# Patient Record
Sex: Female | Born: 1945 | Race: Black or African American | Hispanic: No | Smoking: Never smoker
Health system: Southern US, Community
[De-identification: ages and names within clinical notes are randomized; demographics above are authoritative.]

## PROBLEM LIST (undated history)

## (undated) ENCOUNTER — Inpatient Hospital Stay: Admission: EM | Payer: Self-pay | Source: Home / Self Care

## (undated) DIAGNOSIS — T4145XA Adverse effect of unspecified anesthetic, initial encounter: Secondary | ICD-10-CM

## (undated) DIAGNOSIS — F419 Anxiety disorder, unspecified: Secondary | ICD-10-CM

## (undated) DIAGNOSIS — I639 Cerebral infarction, unspecified: Secondary | ICD-10-CM

## (undated) DIAGNOSIS — Z21 Asymptomatic human immunodeficiency virus [HIV] infection status: Secondary | ICD-10-CM

## (undated) DIAGNOSIS — B20 Human immunodeficiency virus [HIV] disease: Secondary | ICD-10-CM

## (undated) DIAGNOSIS — B59 Pneumocystosis: Secondary | ICD-10-CM

## (undated) DIAGNOSIS — Z9221 Personal history of antineoplastic chemotherapy: Secondary | ICD-10-CM

## (undated) DIAGNOSIS — D332 Benign neoplasm of brain, unspecified: Secondary | ICD-10-CM

## (undated) DIAGNOSIS — M87052 Idiopathic aseptic necrosis of left femur: Secondary | ICD-10-CM

## (undated) DIAGNOSIS — T8859XA Other complications of anesthesia, initial encounter: Secondary | ICD-10-CM

## (undated) DIAGNOSIS — C187 Malignant neoplasm of sigmoid colon: Secondary | ICD-10-CM

## (undated) DIAGNOSIS — I10 Essential (primary) hypertension: Secondary | ICD-10-CM

## (undated) DIAGNOSIS — M87051 Idiopathic aseptic necrosis of right femur: Secondary | ICD-10-CM

## (undated) DIAGNOSIS — C50911 Malignant neoplasm of unspecified site of right female breast: Secondary | ICD-10-CM

## (undated) HISTORY — PX: ABDOMINAL HYSTERECTOMY: SHX81

## (undated) HISTORY — PX: COLON SURGERY: SHX602

## (undated) HISTORY — DX: Cerebral infarction, unspecified: I63.9

## (undated) HISTORY — DX: Benign neoplasm of brain, unspecified: D33.2

## (undated) HISTORY — DX: Malignant neoplasm of sigmoid colon: C18.7

## (undated) HISTORY — DX: Pneumocystosis: B59

## (undated) HISTORY — DX: Personal history of antineoplastic chemotherapy: Z92.21

## (undated) HISTORY — PX: TONSILLECTOMY: SUR1361

---

## 1992-10-16 DIAGNOSIS — B59 Pneumocystosis: Secondary | ICD-10-CM

## 1992-10-16 HISTORY — DX: Pneumocystosis: B59

## 2009-10-16 DIAGNOSIS — C187 Malignant neoplasm of sigmoid colon: Secondary | ICD-10-CM

## 2009-10-16 HISTORY — DX: Malignant neoplasm of sigmoid colon: C18.7

## 2010-10-16 DIAGNOSIS — Z9221 Personal history of antineoplastic chemotherapy: Secondary | ICD-10-CM

## 2010-10-16 HISTORY — DX: Personal history of antineoplastic chemotherapy: Z92.21

## 2010-10-16 HISTORY — PX: TOTAL ABDOMINAL HYSTERECTOMY W/ BILATERAL SALPINGOOPHORECTOMY: SHX83

## 2014-01-07 ENCOUNTER — Telehealth: Payer: Self-pay

## 2014-01-07 DIAGNOSIS — B20 Human immunodeficiency virus [HIV] disease: Secondary | ICD-10-CM | POA: Insufficient documentation

## 2014-01-07 NOTE — Telephone Encounter (Signed)
Patient contacted regarding new intake appointment. Date and time given. Information given regarding documents needed to qualify for financial eligibility.  Kelly Chavez K Asha Grumbine, RN  

## 2014-01-22 ENCOUNTER — Ambulatory Visit (INDEPENDENT_AMBULATORY_CARE_PROVIDER_SITE_OTHER): Payer: No Typology Code available for payment source

## 2014-01-22 DIAGNOSIS — B20 Human immunodeficiency virus [HIV] disease: Secondary | ICD-10-CM

## 2014-01-22 DIAGNOSIS — N83201 Unspecified ovarian cyst, right side: Secondary | ICD-10-CM

## 2014-01-22 DIAGNOSIS — M1990 Unspecified osteoarthritis, unspecified site: Secondary | ICD-10-CM

## 2014-01-22 DIAGNOSIS — A6000 Herpesviral infection of urogenital system, unspecified: Secondary | ICD-10-CM

## 2014-01-22 DIAGNOSIS — Z87898 Personal history of other specified conditions: Secondary | ICD-10-CM

## 2014-01-22 DIAGNOSIS — Z21 Asymptomatic human immunodeficiency virus [HIV] infection status: Secondary | ICD-10-CM

## 2014-01-22 DIAGNOSIS — Z79899 Other long term (current) drug therapy: Secondary | ICD-10-CM

## 2014-01-22 DIAGNOSIS — N83202 Unspecified ovarian cyst, left side: Secondary | ICD-10-CM

## 2014-01-22 DIAGNOSIS — Z0000 Encounter for general adult medical examination without abnormal findings: Secondary | ICD-10-CM

## 2014-01-22 DIAGNOSIS — Z113 Encounter for screening for infections with a predominantly sexual mode of transmission: Secondary | ICD-10-CM

## 2014-01-22 DIAGNOSIS — E559 Vitamin D deficiency, unspecified: Secondary | ICD-10-CM

## 2014-01-22 DIAGNOSIS — I10 Essential (primary) hypertension: Secondary | ICD-10-CM

## 2014-01-22 DIAGNOSIS — M87052 Idiopathic aseptic necrosis of left femur: Secondary | ICD-10-CM

## 2014-01-22 DIAGNOSIS — M87051 Idiopathic aseptic necrosis of right femur: Secondary | ICD-10-CM

## 2014-01-22 DIAGNOSIS — Z23 Encounter for immunization: Secondary | ICD-10-CM

## 2014-01-22 LAB — CBC WITH DIFFERENTIAL/PLATELET
Basophils Absolute: 0 10*3/uL (ref 0.0–0.1)
Basophils Relative: 0 % (ref 0–1)
Eosinophils Absolute: 0.1 10*3/uL (ref 0.0–0.7)
Eosinophils Relative: 1 % (ref 0–5)
HCT: 37 % (ref 36.0–46.0)
Hemoglobin: 12.6 g/dL (ref 12.0–15.0)
LYMPHS ABS: 2.6 10*3/uL (ref 0.7–4.0)
Lymphocytes Relative: 42 % (ref 12–46)
MCH: 29.7 pg (ref 26.0–34.0)
MCHC: 34.1 g/dL (ref 30.0–36.0)
MCV: 87.3 fL (ref 78.0–100.0)
Monocytes Absolute: 0.4 10*3/uL (ref 0.1–1.0)
Monocytes Relative: 6 % (ref 3–12)
NEUTROS ABS: 3.1 10*3/uL (ref 1.7–7.7)
NEUTROS PCT: 51 % (ref 43–77)
Platelets: 271 10*3/uL (ref 150–400)
RBC: 4.24 MIL/uL (ref 3.87–5.11)
RDW: 13.6 % (ref 11.5–15.5)
WBC: 6.1 10*3/uL (ref 4.0–10.5)

## 2014-01-22 LAB — COMPLETE METABOLIC PANEL WITH GFR
ALK PHOS: 143 U/L — AB (ref 39–117)
ALT: 24 U/L (ref 0–35)
AST: 27 U/L (ref 0–37)
Albumin: 3.9 g/dL (ref 3.5–5.2)
BUN: 12 mg/dL (ref 6–23)
CO2: 25 meq/L (ref 19–32)
Calcium: 9.3 mg/dL (ref 8.4–10.5)
Chloride: 103 mEq/L (ref 96–112)
Creat: 0.82 mg/dL (ref 0.50–1.10)
GFR, Est African American: 86 mL/min
GFR, Est Non African American: 74 mL/min
Glucose, Bld: 72 mg/dL (ref 70–99)
Potassium: 4.1 mEq/L (ref 3.5–5.3)
SODIUM: 140 meq/L (ref 135–145)
TOTAL PROTEIN: 7.7 g/dL (ref 6.0–8.3)
Total Bilirubin: 0.3 mg/dL (ref 0.2–1.2)

## 2014-01-22 LAB — LIPID PANEL
Cholesterol: 226 mg/dL — ABNORMAL HIGH (ref 0–200)
HDL: 46 mg/dL (ref >39)
LDL Cholesterol: 159 mg/dL — ABNORMAL HIGH (ref 0–99)
Total CHOL/HDL Ratio: 4.9 Ratio
Triglycerides: 106 mg/dL (ref <150)
VLDL: 21 mg/dL (ref 0–40)

## 2014-01-22 LAB — RPR

## 2014-01-23 LAB — URINALYSIS
Bilirubin Urine: NEGATIVE
Glucose, UA: NEGATIVE mg/dL
HGB URINE DIPSTICK: NEGATIVE
Ketones, ur: NEGATIVE mg/dL
Leukocytes, UA: NEGATIVE
NITRITE: NEGATIVE
Protein, ur: NEGATIVE mg/dL
Specific Gravity, Urine: 1.005 (ref 1.005–1.030)
UROBILINOGEN UA: 0.2 mg/dL (ref 0.0–1.0)
pH: 7 (ref 5.0–8.0)

## 2014-01-23 LAB — HEPATITIS C ANTIBODY: HCV Ab: NEGATIVE

## 2014-01-23 LAB — HEPATITIS B SURFACE ANTIBODY,QUALITATIVE: Hep B S Ab: NEGATIVE

## 2014-01-23 LAB — T-HELPER CELL (CD4) - (RCID CLINIC ONLY)
CD4 T CELL HELPER: 24 % — AB (ref 33–55)
CD4 T Cell Abs: 640 /uL (ref 400–2700)

## 2014-01-23 LAB — URINE CYTOLOGY ANCILLARY ONLY
Chlamydia: NEGATIVE
Neisseria Gonorrhea: NEGATIVE

## 2014-01-23 LAB — HEPATITIS A ANTIBODY, TOTAL: HEP A TOTAL AB: NONREACTIVE

## 2014-01-23 LAB — HEPATITIS B CORE ANTIBODY, TOTAL: Hep B Core Total Ab: NONREACTIVE

## 2014-01-23 LAB — HEPATITIS B SURFACE ANTIGEN: Hepatitis B Surface Ag: NEGATIVE

## 2014-01-24 LAB — HIV-1 RNA ULTRAQUANT REFLEX TO GENTYP+

## 2014-01-24 LAB — TB SKIN TEST
Induration: 0 mm
TB Skin Test: NEGATIVE

## 2014-01-26 DIAGNOSIS — N83201 Unspecified ovarian cyst, right side: Secondary | ICD-10-CM | POA: Insufficient documentation

## 2014-01-26 DIAGNOSIS — N83202 Unspecified ovarian cyst, left side: Secondary | ICD-10-CM

## 2014-01-26 DIAGNOSIS — E559 Vitamin D deficiency, unspecified: Secondary | ICD-10-CM | POA: Insufficient documentation

## 2014-01-26 DIAGNOSIS — I10 Essential (primary) hypertension: Secondary | ICD-10-CM | POA: Insufficient documentation

## 2014-01-26 DIAGNOSIS — M1990 Unspecified osteoarthritis, unspecified site: Secondary | ICD-10-CM | POA: Insufficient documentation

## 2014-01-28 DIAGNOSIS — M87051 Idiopathic aseptic necrosis of right femur: Secondary | ICD-10-CM | POA: Insufficient documentation

## 2014-01-28 DIAGNOSIS — A6000 Herpesviral infection of urogenital system, unspecified: Secondary | ICD-10-CM | POA: Insufficient documentation

## 2014-01-28 DIAGNOSIS — Z87898 Personal history of other specified conditions: Secondary | ICD-10-CM | POA: Insufficient documentation

## 2014-01-28 DIAGNOSIS — M87052 Idiopathic aseptic necrosis of left femur: Secondary | ICD-10-CM

## 2014-01-28 NOTE — Progress Notes (Signed)
Patient recently moved from [place] to [place] to be closer to her son.  She has been diagnosed with HIV since 1994 and thinks she was infected by her husband of 13 years  whom she suspected may have been bisexual. He is now deceased.  She is a retired Education officer, museum. Patient lives alone and has complaints of feeling someone is coming in and out of her home.  Her son thinks she is "tripping".  She admits to feeling "paranoid' most of the time and wonders if it is associated with her current HIV regimen. Throughout the intake she asked  several times if I smelled something in the room  or if I smelled her body odor.  I was not able to smell anything.  Her medical records document previous complaints of paranoia and depression.   She  discussed having some issues regarding the shame of having HIV and the stigma associated with it. I have suggested she  speak with our counselor for assistance and she has accepted. She will return on 01-29-14 to see Grayland Ormond with [facility] the [place]. I also gave her information regarding our female support group.  Patient reports 100% compliance with HIV regimen.  She has not taken medications for hypertension since December 2014 and has no reason for non compliance.  She uses a walker to ambulate due to bilateral hip avascular necrosis. Patient will need a primary care referral due to complicated  medical history.  Medical records received.  Vaccines update.  Laverle Patter, RN

## 2014-01-29 ENCOUNTER — Ambulatory Visit: Payer: No Typology Code available for payment source

## 2014-01-29 DIAGNOSIS — F22 Delusional disorders: Secondary | ICD-10-CM

## 2014-01-29 NOTE — Progress Notes (Unsigned)
I met with Kelly Chavez today for the first time and she presents as a soft spoken 68 year old African American female with no apparent prior psychiatric history.  She walks slowly with the use of a wheeled walker.  She took 10-12 minutes or longer looking over the basic consent for treatment form and release of information form I have everyone sign at the beginning of treatment before signing it.  She asked if I smelled mildew and wondered if there was any mildew in this building, which I answered 'no'.  Then she coughed, and said "and now I'm coughing".    She answered questions fairly well, but when asked about her work history she simply said she had worked for "[employer]".  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  She said she moved here in December, 2014 to be closer to her son.  She rated her current mood at 5 on a 10 pt scale, but said it is usually a 7.  She reports good sleep - 8 & 1/2 hours and okay appetite.  She talked about her new house, which is apparently new Architect, and said she smells an alcohol smell or the mildew smell of wet, rotten wood.  She denied smelling it anywhere but in and around her house, which contradicted her prior questioning about it in this building.  Denies any suicidal thoughts.  She talked about water being therapeutic and how she used to go look out at the [facility].  She also used to enjoy classes at a senior center, so I gave her contact information for the [facility] on [address] here in [place].  She mentioned that she had received a phonecall years ago saying she would be "terrorized" and she has been on guard ever since.  She said her mother is 70 and now lives in a nursing home in [place].  She said she feels some remorse about putting her there, because she developed dementia once she was removed from her normal environment.  I pointed out that she too had left the environment she was comfortable and she agreed.  She is trying to get  connected with a church and said she would call the [facility].  I scheduled her for my next available, which is March 16, 2014, but told her I would look for a cancellation before then.  She clearly has some paranoid delusions, but it is unclear at this point the extent of this, let alone the origin.  A neurological exam may be useful at this time.  She clearly has functioned at a higher level most of her life.

## 2014-01-30 LAB — HLA B*5701: HLA-B*5701 w/rflx HLA-B High: NEGATIVE

## 2014-02-19 ENCOUNTER — Encounter: Payer: Self-pay | Admitting: Internal Medicine

## 2014-02-19 ENCOUNTER — Ambulatory Visit (INDEPENDENT_AMBULATORY_CARE_PROVIDER_SITE_OTHER): Payer: 59 | Admitting: Internal Medicine

## 2014-02-19 ENCOUNTER — Other Ambulatory Visit: Payer: Self-pay | Admitting: *Deleted

## 2014-02-19 VITALS — BP 195/105 | HR 94 | Temp 97.6°F | Wt 176.0 lb

## 2014-02-19 DIAGNOSIS — B20 Human immunodeficiency virus [HIV] disease: Secondary | ICD-10-CM

## 2014-02-19 DIAGNOSIS — N63 Unspecified lump in unspecified breast: Secondary | ICD-10-CM

## 2014-02-19 MED ORDER — RITONAVIR 100 MG PO CAPS: 100.0000 mg | ORAL_CAPSULE | Freq: Two times a day (BID) | ORAL | Status: DC

## 2014-02-19 MED ORDER — DARUNAVIR ETHANOLATE 600 MG PO TABS: 600.0000 mg | ORAL_TABLET | Freq: Two times a day (BID) | ORAL | Status: DC

## 2014-02-19 MED ORDER — EMTRICITABINE-TENOFOVIR DF 200-300 MG PO TABS: 1.0000 | ORAL_TABLET | Freq: Every day | ORAL | Status: DC

## 2014-02-19 NOTE — Progress Notes (Signed)
Subjective:    Patient ID: Kelly Chavez, female    DOB: 1946-04-11, 68 y.o.   MRN: [id number]  HPI 68yo F with HIV c/b pcp pneumonia at dx  in 1994, hx of colon ca dx 2011 s/p sigmoid colectomy & adjuvent chemo. HTN, Cd 4 count of 640/VL<20, on truvada/DRVr. She relocated in the last 3 months to [place] to be with son. Previously lived in [place]. She contracted hiv from her deceased husband who died of aids in 64. Unclear if she was previously tested prior to her dx.  ROS: has had hallucinations for several years, no harm to self or others. Not on meds for it. Left breast tenderness. Up for mammo soon. Current Outpatient Prescriptions on File Prior to Visit  Medication Sig Dispense Refill  . atenolol (TENORMIN) 50 MG tablet Take 50 mg by mouth daily.      . darunavir (PREZISTA) 600 MG tablet Take 600 mg by mouth daily with breakfast.      . emtricitabine-tenofovir (TRUVADA) 200-300 MG per tablet Take 1 tablet by mouth daily.      . ritonavir (NORVIR) 100 MG capsule Take 100 mg by mouth 2 (two) times daily with a meal.       No current facility-administered medications on file prior to visit.   Active Ambulatory Problems    Diagnosis Date Noted  . HIV disease 01/07/2014  . Ovarian cyst, bilateral 01/26/2014  . Essential hypertension, benign 01/26/2014  . Unspecified vitamin D deficiency 01/26/2014  . DJD (degenerative joint disease) 01/26/2014  . History of pituitary tumor 01/28/2014  . Avascular necrosis of bones of both hips 01/28/2014  . Genital herpes 01/28/2014   Resolved Ambulatory Problems    Diagnosis Date Noted  . No Resolved Ambulatory Problems   Past Medical History  Diagnosis Date  . PCP (pneumocystis carinii pneumonia) 1994  . Cancer of sigmoid colon 2011  . Status post chemotherapy 2012  . Brain tumor (benign)    History  Substance Use Topics  . Smoking status: Never Smoker   . Smokeless tobacco: Never Used  . Alcohol Use: No    family history includes  Cancer in her father; Dementia in her mother; Diabetes in her brother, father, and sister; Hyperlipidemia in her father and mother.   Review of Systems 10 point ros is negative except what is mentioned in hpi    Objective:   Physical Exam BP 195/105  Pulse 94  Temp(Src) 97.6 F (36.4 C) (Oral)  Wt 176 lb (79.833 kg) Physical Exam  Constitutional:  oriented to person, place, and time. appears well-developed and well-nourished. No distress.  HENT:  Mouth/Throat: Oropharynx is clear and moist. No oropharyngeal exudate.  Cardiovascular: Normal rate, regular rhythm and normal heart sounds. Exam reveals no gallop and no friction rub.  No murmur heard.  Pulmonary/Chest: Effort normal and breath sounds normal. No respiratory distress.  has no wheezes.  Abdominal: Soft. Bowel sounds are normal.  exhibits no distension. There is no tenderness.  Lymphadenopathy: no cervical adenopathy.  Neurological: alert and oriented to person, place, and time.  Skin: Skin is warm and dry. No rash noted. No erythema.  Psychiatric: a normal mood and affect.  behavior is normal.         Assessment & Plan:  hiv = will refill meds give co pay cards  htn = she menitioned she has not taken any bp meds thus far, will recheck at next visit and treat more accordingly. She is asymptomatic. On repeat  bp check, decreased to Sbp 169/95  Health maintenance = will give hep B #1. Also refer to pcp. mammo  Visual hallucinations = we will reassess at next appointment for referral to psychiatry. Based upon her history it appears that they have been long standing. She has an element of insight to some of the images that she sees but still abit unusual and may benefit from treatment

## 2014-02-26 ENCOUNTER — Telehealth: Payer: Self-pay | Admitting: Licensed Clinical Social Worker

## 2014-02-26 NOTE — Telephone Encounter (Signed)
Patient needs extraction and dental treatrment at Dr. Autumn Patty office they need a note stating that it is ok. Patient's viral load is undetectable and cd4 is 640. Please advise

## 2014-03-02 NOTE — Telephone Encounter (Signed)
Please let Dr. Evette Doffing knows that her HIV is well controlled and there is no contraindication to have any dental work done.

## 2014-03-16 ENCOUNTER — Ambulatory Visit: Payer: No Typology Code available for payment source

## 2014-03-16 DIAGNOSIS — F4320 Adjustment disorder, unspecified: Secondary | ICD-10-CM

## 2014-03-16 NOTE — Progress Notes (Signed)
Kelly Chavez was pleasant again today and participated in completing a treatment plan, which included a goal to reduce "incidents" where she thinks odd things are happening.  She said she is here to determine reality from fantasy and that her son thinks these things are "in her head".  She said she thinks people are listening in on her phone conversations.  She thinks this may be one of her "haters" - possibly someone she had an affair with in the past, who may be HIV+ now and is upset with her.  She also said her TV acts up sometimes, but wonders if she just doesn't know how to use the remote.  I mentioned neurological concerns, but she didn't say anything about it when I said this.  Plan to meet in 6 weeks due to scheduling, but I told her to call if she needs to get in sooner.

## 2014-03-18 ENCOUNTER — Other Ambulatory Visit: Payer: Self-pay | Admitting: Internal Medicine

## 2014-03-18 DIAGNOSIS — N644 Mastodynia: Secondary | ICD-10-CM

## 2014-03-26 ENCOUNTER — Encounter: Payer: Self-pay | Admitting: Internal Medicine

## 2014-03-26 ENCOUNTER — Ambulatory Visit (INDEPENDENT_AMBULATORY_CARE_PROVIDER_SITE_OTHER): Payer: No Typology Code available for payment source | Admitting: Internal Medicine

## 2014-03-26 VITALS — BP 208/103 | HR 75 | Temp 97.4°F | Wt 184.0 lb

## 2014-03-26 DIAGNOSIS — I10 Essential (primary) hypertension: Secondary | ICD-10-CM

## 2014-03-26 MED ORDER — AMLODIPINE BESYLATE 10 MG PO TABS: 10.0000 mg | ORAL_TABLET | Freq: Every day | ORAL | Status: DC

## 2014-03-26 NOTE — Progress Notes (Signed)
   Subjective:    Patient ID: Kelly Chavez, female    DOB: 09/27/1946, 68 y.o.   MRN: [id number]  HPI 68yo F, with HIV disease, CD 4 count of 640/VL<20 (april) on truvada/DRVr. Relocated from [place]. Last seen a week ago.She is getting settled into her new apartment. She states that occ still feels people are talking about her. She denies any visual hallucinations. She has noted breast lumps. Continues to take hiv medications. Has not had her blood pressure medicines this morning. Denies headache. She is distraught since she found out this morning that a friend had past away.  Current Outpatient Prescriptions on File Prior to Visit  Medication Sig Dispense Refill  . atenolol (TENORMIN) 50 MG tablet Take 50 mg by mouth daily.      . darunavir (PREZISTA) 600 MG tablet Take 1 tablet (600 mg total) by mouth 2 (two) times daily with a meal.  60 tablet  6  . emtricitabine-tenofovir (TRUVADA) 200-300 MG per tablet Take 1 tablet by mouth daily.  30 tablet  6  . ritonavir (NORVIR) 100 MG capsule Take 1 capsule (100 mg total) by mouth 2 (two) times daily with a meal.  60 capsule  6   No current facility-administered medications on file prior to visit.   Active Ambulatory Problems    Diagnosis Date Noted  . HIV disease 01/07/2014  . Ovarian cyst, bilateral 01/26/2014  . Essential hypertension, benign 01/26/2014  . Unspecified vitamin D deficiency 01/26/2014  . DJD (degenerative joint disease) 01/26/2014  . History of pituitary tumor 01/28/2014  . Avascular necrosis of bones of both hips 01/28/2014  . Genital herpes 01/28/2014  . Breast cancer of upper-inner quadrant of right female breast 04/15/2014   Resolved Ambulatory Problems    Diagnosis Date Noted  . No Resolved Ambulatory Problems   Past Medical History  Diagnosis Date  . PCP (pneumocystis carinii pneumonia) 1994  . Cancer of sigmoid colon 2011  . Status post chemotherapy 2012  . Brain tumor (benign)        Review of Systems 10  point ros is negative other than concern of mortality in setting of recent death of friend    Objective:   Physical Exam  BP 208/103  Pulse 75  Temp(Src) 97.4 F (36.3 C) (Oral)  Wt 184 lb (83.462 kg) Physical Exam  Constitutional:  oriented to person, place, and time. appears well-developed and well-nourished. No distress.  HENT:  Mouth/Throat: Oropharynx is clear and moist. No oropharyngeal exudate.  Cardiovascular: Normal rate, regular rhythm and normal heart sounds. Exam reveals no gallop and no friction rub.  No murmur heard.  Pulmonary/Chest: Effort normal and breath sounds normal. No respiratory distress.  has no wheezes.  Lymphadenopathy: no cervical adenopathy.  Neurological: alert and oriented to person, place, and time.  Skin: Skin is warm and dry. No rash noted. No erythema.  Psychiatric: a normal mood and affect.  behavior is normal.        Assessment & Plan:  Hiv = well controlled, continue on current regimen  htn = poorly controlled, will add amlodipine 10mg , start with 1/2 tab then increase to full tab.  Health maintenance = due to having breast lump, concern for malignancy, will get her to have mammogram  Visual/auditory hallucinaitons = does not appear to have medications or seeing psychiatry. Will refer at next appointment. Currently stable and not causing harm to self or others

## 2014-04-02 ENCOUNTER — Other Ambulatory Visit: Payer: Self-pay | Admitting: Internal Medicine

## 2014-04-02 ENCOUNTER — Encounter (INDEPENDENT_AMBULATORY_CARE_PROVIDER_SITE_OTHER): Payer: Self-pay

## 2014-04-02 ENCOUNTER — Ambulatory Visit
Admission: RE | Admit: 2014-04-02 | Discharge: 2014-04-02 | Disposition: A | Discharge status: Home / Self Care | Payer: No Typology Code available for payment source | Source: Ambulatory Visit | Attending: Internal Medicine | Admitting: Internal Medicine

## 2014-04-02 DIAGNOSIS — N644 Mastodynia: Secondary | ICD-10-CM

## 2014-04-02 DIAGNOSIS — R2231 Localized swelling, mass and lump, right upper limb: Secondary | ICD-10-CM

## 2014-04-02 DIAGNOSIS — N6310 Unspecified lump in the right breast, unspecified quadrant: Secondary | ICD-10-CM

## 2014-04-09 ENCOUNTER — Ambulatory Visit
Admission: RE | Admit: 2014-04-09 | Discharge: 2014-04-09 | Disposition: A | Discharge status: Home / Self Care | Payer: No Typology Code available for payment source | Source: Ambulatory Visit | Attending: Internal Medicine | Admitting: Internal Medicine

## 2014-04-09 ENCOUNTER — Other Ambulatory Visit: Payer: Self-pay | Admitting: Internal Medicine

## 2014-04-09 ENCOUNTER — Ambulatory Visit: Payer: No Typology Code available for payment source | Admitting: Licensed Clinical Social Worker

## 2014-04-09 VITALS — BP 153/92 | HR 89

## 2014-04-09 DIAGNOSIS — N6310 Unspecified lump in the right breast, unspecified quadrant: Secondary | ICD-10-CM

## 2014-04-09 DIAGNOSIS — R2231 Localized swelling, mass and lump, right upper limb: Secondary | ICD-10-CM

## 2014-04-09 DIAGNOSIS — C50911 Malignant neoplasm of unspecified site of right female breast: Secondary | ICD-10-CM

## 2014-04-09 DIAGNOSIS — I10 Essential (primary) hypertension: Secondary | ICD-10-CM

## 2014-04-09 HISTORY — DX: Malignant neoplasm of unspecified site of right female breast: C50.911

## 2014-04-09 NOTE — Progress Notes (Signed)
Patient seen today for BP check patient admitted that she has not started her new blood pressure medication that was changed at last visit. She is taking what she already had. Her bp was lower today than at her last visit.

## 2014-04-10 ENCOUNTER — Ambulatory Visit
Admission: RE | Admit: 2014-04-10 | Discharge: 2014-04-10 | Disposition: A | Discharge status: Home / Self Care | Payer: No Typology Code available for payment source | Source: Ambulatory Visit | Attending: Internal Medicine | Admitting: Internal Medicine

## 2014-04-10 ENCOUNTER — Other Ambulatory Visit: Payer: Self-pay | Admitting: Internal Medicine

## 2014-04-10 DIAGNOSIS — N6310 Unspecified lump in the right breast, unspecified quadrant: Secondary | ICD-10-CM

## 2014-04-10 DIAGNOSIS — C50919 Malignant neoplasm of unspecified site of unspecified female breast: Secondary | ICD-10-CM

## 2014-04-15 ENCOUNTER — Ambulatory Visit (INDEPENDENT_AMBULATORY_CARE_PROVIDER_SITE_OTHER): Payer: 59 | Admitting: Surgery

## 2014-04-15 ENCOUNTER — Telehealth: Payer: Self-pay | Admitting: *Deleted

## 2014-04-15 ENCOUNTER — Other Ambulatory Visit (INDEPENDENT_AMBULATORY_CARE_PROVIDER_SITE_OTHER): Payer: Self-pay

## 2014-04-15 ENCOUNTER — Other Ambulatory Visit (INDEPENDENT_AMBULATORY_CARE_PROVIDER_SITE_OTHER): Payer: Self-pay | Admitting: *Deleted

## 2014-04-15 ENCOUNTER — Telehealth: Payer: Self-pay | Admitting: Internal Medicine

## 2014-04-15 ENCOUNTER — Encounter (INDEPENDENT_AMBULATORY_CARE_PROVIDER_SITE_OTHER): Payer: Self-pay | Admitting: Surgery

## 2014-04-15 VITALS — BP 146/82 | HR 80 | Temp 98.3°F | Ht 65.0 in | Wt 179.0 lb

## 2014-04-15 DIAGNOSIS — C50919 Malignant neoplasm of unspecified site of unspecified female breast: Secondary | ICD-10-CM

## 2014-04-15 DIAGNOSIS — C50219 Malignant neoplasm of upper-inner quadrant of unspecified female breast: Secondary | ICD-10-CM

## 2014-04-15 DIAGNOSIS — D0591 Unspecified type of carcinoma in situ of right breast: Secondary | ICD-10-CM

## 2014-04-15 DIAGNOSIS — C50211 Malignant neoplasm of upper-inner quadrant of right female breast: Secondary | ICD-10-CM

## 2014-04-15 NOTE — Telephone Encounter (Signed)
Called pt and confirmed 04/22/14 appt w/ her.  Mailed before appt letter, welcoming packet & intake form to pt.  Emailed Glenda at [facility] to make her aware.  Took paperwork to HIM to create chart.

## 2014-04-15 NOTE — Telephone Encounter (Signed)
Message copied by Reggy Eye on Wed Apr 15, 2014  4:22 PM ------      Message from: [name]      Created: Fri Apr 10, 2014 11:31 PM       Can we refer her to [facility] for invasive breast cancer.  ------

## 2014-04-15 NOTE — Telephone Encounter (Signed)
Attempted to contact patient to let her know that we have arranged appointment with [facility] for July 8th at 1:30pm. We are coordinating with radiology and pathology to send path slides and films to their clinic prior to her appointment. She is seeing general surgeon dr. Jacinto Reap for surgical evaluation for her newly diagnosed invasive ductal ca stage II

## 2014-04-15 NOTE — Telephone Encounter (Signed)
Per Dr Baxter Flattery called [facility] to make an appt for the patient who is newly diagnosed with Breast Cancer. Spoke with Kennyth Lose at [phone] [phone] and was given an appt for the patient 04/22/14 at 130 pm with Dr Clovis Riley. Advised they are located on the 4th floor of the [facility] located near [address] in [place]. Given the fax number of [phone] to send notes and advised to have the patient path slides and image disc fedex to them as the doctor needs to see them prior to the visit. Kennyth Lose faxed the shipping information for [employer]. I faxed information to Path lab at [facility] and they are shipping slides overnight. Radiology is getting Disc ready for pick up and I will send out today by 3 pm.   Called the patient to let her know about the appt and she was unavailable as she had an appt with [facility] and I will try to contact her later today.

## 2014-04-15 NOTE — Telephone Encounter (Signed)
Message copied by Reggy Eye on Wed Apr 15, 2014  4:28 PM ------      Message from: [name]      Created: Fri Apr 03, 2014  9:09 AM       It looks like her mammo is very suspicious for breast ca. Can we get her into the cancer center soon to see onc. She gets biopsy next week and path should be available by 1st of july ------

## 2014-04-15 NOTE — Telephone Encounter (Signed)
Called the patient for the 2nd time to try and get her the information about the appt at [facility] and had to leave a message for her to cal the office asap.

## 2014-04-15 NOTE — Progress Notes (Addendum)
Re:   Kelly Chavez DOB:   [birth year] MRN:   [id number]  ASSESSMENT AND PLAN: 1.  Right breast cancer, 2 o'clock  Biopsy 04/09/2014 [id number]) - right breast invasive ductal ca, right axillary lymph node - carcinoma, Triple negative, Ki67 - 19%  I discussed the options for breast cancer treatment with the patient.  I discussed a multidisciplinary approach to the treatment of breast cancer, which includes medical oncology and radiation oncology.  I discussed the surgical options of lumpectomy vs. mastectomy.  If mastectomy, there is the possibility of reconstruction.   I discussed the options of lymph node biopsy.  The treatment plan depends on the pathologic staging of the tumor and the patient's personal wishes.  The risks of surgery include, but are not limited to, bleeding, infection, the need for further surgery, and nerve injury.  The patient has been given literature on the treatment of breast cancer.  Plan:  1) Needs MRI - she is undecided about the MRI, but leaning to having it , 2) To get med onc and radiation oncology consult, 3) Possible neoadjuvant chemotx, 4) Discussed power port, 5) We talked about finding a PCP (she has an appt with Dr. Liston Chavez), 6)  I will see her back (or talk to her on the phone) when this is complete.  There is a little friction between son and mother.  And the patient is a little hesitant to get the MRI, but I think it could help in staging the cancer and planning treatment.  [I spoke with Dr. Clydene Chavez.  He has PET scan ordered.  She is going ahead with the MRI.  He thinks that she is leaning to a mastectomy, axillary node dissection, and power port placement.  DN 04/22/2014] [MRI of right breast - 04/23/2014 - Large biopsy-proven carcinoma in the upper-inner quadrant/upper central right breast with adjacent skin thickening and skin retraction. Skin involvement by tumor cannot be excluded. A second area of suspicious enhancement in the immediate retroareolar  right breast is suspicious for multifocal carcinoma. It is suspected that the biopsy performed in the retroareolar right breast recently that demonstrated fibrosis reflects some fibrosis intervening between the 2 suspicious masses. The biopsy clip for the biopsied fibrosis is deep and cephalad to the suspicious finding on MRI. Tumor involvement of the right nipple/areolar complex cannot be excluded. Right axillary lymphadenopathy consistent with biopsy proven metastatic carcinoma.  PET scan - 04/30/2014 - right breast and right axilla hypermetabolic, but no mets. I spoke with the patient, but she is vacillating between surgery first or chemotx first.  I tried to explain the benefits of each.  She has an appt to see Dr. Clydene Chavez on 05/27/2014 and wants to talk to him again.  DN 05/14/2014]  [She saw Dr. Wanda Chavez. Kelly Chavez 05/27/2014.  For some reason she was waiting for me to call her.  Anyway, I spent 20+ minutes on the phone with both Kelly Chavez and her son, Kelly Chavez, about the surgery and recovery.  We will go ahead and schedule right MRM and power port placement. I offered to see her in the office before surgery, but she feels comfortable with the plan.  DN 06/08/2014]  2.  HIV - stable  Sees Dr. Retia Chavez 3.  Sigmoid colon cancer - 2011  Stage 2 per patient - I have no records of this encounter.  This was outside of [place], [place].  She is going to try to get records. 4.  HTN 5.  Avascular necrosis of  both hips.  The patient and son are unaware of this diagnosis.  She says that she has arthritis of the hips.   Chief Complaint  Patient presents with  . eval breast ca   REFERRING PHYSICIAN: Michel Bickers, [place]  HISTORY OF PRESENT ILLNESS: Kelly Chavez is a 68 y.o. (DOB: 1946-05-11)  AA  female whose primary care physician is Kelly Bickers, MD and comes to me today for right breast cancer. She is accompanied with her son, Kelly Chavez. The patient moved to [place] from [place] in December of 2014. She is widowed.  She moved here to be close to her only son.  She noticed in December 2014,  some hurting and lumpiness in her breast.  Her last mammogram was about 2 years ago. She has 2 cousins on her father's side who have had breast cancer. She had a hysterectomy and bilateral oophrectomy for benign disease in March of 2013. She has not been on hormonal medicine.  Right mammogram ([facility]) - 04/02/2014 - 1. Two right breast masses with abnormal intervening tissue compatible with multifocal breast carcinoma, as described above.  2. Multiple abnormal appearing right axillary lymph nodes, compatible with metastatic nodes.  Biopsy 04/09/2014 [phone]) - right breast invasive ductal ca, right axillary lymph node - carcinoma   Past Medical History  Diagnosis Date  . PCP (pneumocystis carinii pneumonia) 1994  . Cancer of sigmoid colon 2011    sigmoid colectomy   . Status post chemotherapy 2012  . Brain tumor (benign)       Past Surgical History  Procedure Laterality Date  . Tonsillectomy    . Total abdominal hysterectomy w/ bilateral salpingoophorectomy  2012    Current Outpatient Prescriptions  Medication Sig Dispense Refill  . amLODipine (NORVASC) 10 MG tablet Take 1 tablet (10 mg total) by mouth daily. Start with taking 1/2 tab daily x 4 days, then increase for full tab  30 tablet  11  . atenolol (TENORMIN) 50 MG tablet Take 50 mg by mouth daily.      . darunavir (PREZISTA) 600 MG tablet Take 1 tablet (600 mg total) by mouth 2 (two) times daily with a meal.  60 tablet  6  . emtricitabine-tenofovir (TRUVADA) 200-300 MG per tablet Take 1 tablet by mouth daily.  30 tablet  6  . ritonavir (NORVIR) 100 MG capsule Take 1 capsule (100 mg total) by mouth 2 (two) times daily with a meal.  60 capsule  6   No current facility-administered medications for this visit.     No Known Allergies  REVIEW OF SYSTEMS: Skin:  No history of rash.  No history of abnormal moles. Infection:  History of HIV.   Has had this since 1994. Neurologic:  No history of stroke.  No history of seizure.  No history of headaches. Cardiac: History of HTN since 2010.  No history of seeing a cardiologist. Pulmonary:  Does not smoke cigarettes.  No asthma or bronchitis.  No OSA/CPAP.  Endocrine:  No diabetes. No thyroid disease.  History of pituitary tumor, but she cannot tell me much about this. Gastrointestinal:  No history of stomach disease.  No history of liver disease.  No history of gall bladder disease.  No history of pancreas disease.  Colon cancer resected in Oct 2011.  Stage 2 according to patient.  She had chemotx.  A port was put in about half way through the treatment. Urologic:  No history of kidney stones.  No history of bladder infections. Musculoskeletal:  Hip pain. Hematologic:  No bleeding disorder.  No history of anemia.  Not anticoagulated. Psycho-social:  The patient is oriented.   The patient has no obvious psychologic or social impairment to understanding our conversation and plan.  SOCIAL and FAMILY HISTORY: Widowed She is with her son, Kelly Chavez. She is retired from [employer].  PHYSICAL EXAM: BP 146/82  Pulse 80  Temp(Src) 98.3 F (36.8 C)  Ht 5' 5"  (1.651 m)  Wt 179 lb (81.194 kg)  BMI 29.79 kg/m2  General: Older AA F who is alert.  She is very hard to talk to and does not volunteer information very easily.  HEENT: Normal. Pupils equal. Neck: Supple. No mass.  No thyroid mass. Lymph Nodes:  No supraclavicular or cervical nodes.  I can feel some right axillary adenopathy. Lungs: Clear to auscultation and symmetric breath sounds. Heart:  RRR. No murmur or rub. Breasts:  Right:  Bruised.  She has a 4 cm mass at the 12 o'clock position of the right breast.  Left:  I feel no mass or nodule.  Abdomen: Soft. No mass. No tenderness. No hernia. Normal bowel sounds.  She has a well healed lower midline scar. Rectal: Not done. Extremities:  She moves very slowly.  Has a walker to help  her in walking. Neurologic:  Grossly intact to motor and sensory function. Psychiatric: Has normal mood and affect. Behavior is normal.   DATA REVIEWED: Epic notes.  Alphonsa Overall, MD,  [facility], [address]  [place], [place]    [place] Phone:  [phone] FAX:  [phone]

## 2014-04-16 ENCOUNTER — Ambulatory Visit
Admission: RE | Admit: 2014-04-16 | Discharge: 2014-04-16 | Disposition: A | Discharge status: Home / Self Care | Payer: No Typology Code available for payment source | Source: Ambulatory Visit | Attending: Internal Medicine | Admitting: Internal Medicine

## 2014-04-16 ENCOUNTER — Other Ambulatory Visit: Payer: Self-pay | Admitting: Internal Medicine

## 2014-04-16 DIAGNOSIS — C50919 Malignant neoplasm of unspecified site of unspecified female breast: Secondary | ICD-10-CM

## 2014-04-20 ENCOUNTER — Other Ambulatory Visit: Payer: No Typology Code available for payment source

## 2014-04-22 ENCOUNTER — Other Ambulatory Visit: Payer: No Typology Code available for payment source

## 2014-04-22 ENCOUNTER — Ambulatory Visit: Payer: No Typology Code available for payment source

## 2014-04-22 ENCOUNTER — Ambulatory Visit (HOSPITAL_BASED_OUTPATIENT_CLINIC_OR_DEPARTMENT_OTHER): Payer: No Typology Code available for payment source | Admitting: Oncology

## 2014-04-22 ENCOUNTER — Encounter: Payer: Self-pay | Admitting: Oncology

## 2014-04-22 ENCOUNTER — Telehealth: Payer: Self-pay | Admitting: Oncology

## 2014-04-22 VITALS — BP 186/79 | HR 57 | Temp 98.4°F | Resp 18 | Ht 65.0 in | Wt 178.6 lb

## 2014-04-22 DIAGNOSIS — B20 Human immunodeficiency virus [HIV] disease: Secondary | ICD-10-CM

## 2014-04-22 DIAGNOSIS — Z85038 Personal history of other malignant neoplasm of large intestine: Secondary | ICD-10-CM

## 2014-04-22 DIAGNOSIS — C50219 Malignant neoplasm of upper-inner quadrant of unspecified female breast: Secondary | ICD-10-CM

## 2014-04-22 DIAGNOSIS — Z171 Estrogen receptor negative status [ER-]: Secondary | ICD-10-CM

## 2014-04-22 DIAGNOSIS — C50211 Malignant neoplasm of upper-inner quadrant of right female breast: Secondary | ICD-10-CM

## 2014-04-22 NOTE — Progress Notes (Signed)
Checked in new patient with no financial issues prior to seeing the dr. She has appt card and has not been out of country.

## 2014-04-22 NOTE — Consult Note (Signed)
Reason for Referral: Breast cancer.   HPI: 68 year old woman currently of [place] where she have been living recently. 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 She is a past medical history significant for HIV disease as well as history of colon cancer. She was in her usual so she developed a hurting and lumpiness in her right breast. Her last mammogram was about 2 years ago. She subsequently underwent a right breast mammogram on 04/02/2014 which showed two right breast masses with abnormal intervening tissue compatible with multifocal breast carcinoma. She also noted to have multiple abnormal appearing right axillary lymph nodes, compatible with metastatic nodes. She subsequently underwent a biopsy on 04/09/[id number] [id number])  Which showed a right breast invasive ductal cancer and right axillary lymph node involved with carcinoma the tumor is ER/PR negative as well as HER-2/neu negative. She was evaluated by Dr. Lucia Gaskins for possible mastectomy as well as presented in the breast cancer tumor Board. She was referred to me for the evaluation for possible neoadjuvant treatments.   Clinically, she is reporting some discomfort associated with her biopsy site especially under her right axilla. She does not report any constitutional symptoms of fevers or chills or sweats. Has not reported any weight loss or appetite changes. She has not reported any headaches or blurry vision or syncope. She has not reported any seizure activity. Not reporting any chest pain shortness of breath cough or hemoptysis. She she has not reported any nausea or vomiting or abdominal pain. Has not reported any frequency urgency or hesitancy. Has not reported any skeletal complaints. Has not reported any arthralgias or myalgias. Rest of the review of systems unremarkable. 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     Past Medical History  Diagnosis Date  . PCP (pneumocystis carinii pneumonia) 1994  . Cancer of sigmoid colon 2011    sigmoid colectomy   . Status post chemotherapy 2012  . Brain tumor (benign)   :  Past Surgical History  Procedure Laterality Date  . Tonsillectomy    . Total abdominal hysterectomy w/ bilateral salpingoophorectomy  2012  :  Current Outpatient Prescriptions  Medication Sig Dispense Refill  . atenolol (TENORMIN) 50 MG tablet Take 50 mg by mouth daily.      . darunavir (PREZISTA) 600 MG tablet Take 1 tablet (600 mg total) by mouth 2 (two) times daily with a meal.  60 tablet  6  . emtricitabine-tenofovir (TRUVADA) 200-300 MG per tablet Take 1 tablet by mouth daily.  30 tablet  6  . ritonavir (NORVIR) 100 MG capsule Take 1 capsule (100 mg total) by mouth 2 (two) times daily with a meal.  60 capsule  6   No current facility-administered medications for this visit.       No Known Allergies:  Family History  Problem Relation Age of Onset  . Diabetes Father   . Cancer Father   . Hyperlipidemia Father   . Diabetes Sister   . Diabetes Brother   . Hyperlipidemia Mother   . Dementia Mother   :  History   Social History  . Marital Status: Widowed    Spouse Name: N/A    Number of Children: N/A  . Years of Education: N/A   Occupational History  . Not on file.   Social History Main Topics  . Smoking status: Never Smoker   . Smokeless tobacco: Never Used  .  Alcohol Use: No  . Drug Use: No  . Sexual Activity: Not Currently    Partners: Male    Birth Control/ Protection: Abstinence   Other Topics Concern  . Not on file   Social History Narrative  . No narrative on file  :  Pertinent items are noted in HPI.  Exam: ECOG 1 Blood pressure 186/79, pulse 57, temperature 98.4 F (36.9 C), temperature source Oral, resp. rate 18, height _0  (1.651 m), weight 178 lb 9.6 oz (81.012 kg), SpO2 98.00%. General appearance: alert and  cooperative Head: Normocephalic, without obvious abnormality Throat: lips, mucosa, and tongue normal; teeth and gums normal Neck: no adenopathy Back: symmetric, no curvature. ROM normal. No CVA tenderness. Resp: clear to auscultation bilaterally Chest wall: no tenderness Breasts: normal appearance, no masses or tenderness, No breast masses or lesions palpated in the left. A fixed hard mass noted in the right breast  about 4 cm in diameter on the 12:00 position with palpable adenopathy. No chest wall abnormalities noted. Cardio: regular rate and rhythm, S1, S2 normal, no murmur, click, rub or gallop GI: soft, non-tender; bowel sounds normal; no masses,  no organomegaly Extremities: extremities normal, atraumatic, no cyanosis or edema Pulses: 2+ and symmetric    Assessment and Plan:   68 year old woman with the following issues:  1. Recent diagnosis of invasive ductal carcinoma of the right breast presented with a 4 cm mass as well as palpable adenopathy. The biopsy confirmed these findings and showed that the tumor is triple negative breast cancer. The natural course of this disease was discussed today with the patient and her son extensively. I also discussed this case with Dr. Isidore Moos as well as Dr. Lucia Gaskins to offer her a true multidisciplinary opinion. I do agree with obtaining an MRI of the breast to rule out tumor adherence to the chest wall and I will also set her up with a PET/CT scan to rule out metastatic disease. If she doesn't have any of your these findings then the choice offered to the patient today would include neoadjuvant chemotherapy which offers the possibility of downstaging the tumor and the likelihood of breast conservation. Alternatively, upfront mastectomy and lymphadenectomy would be an option and chemotherapy will be done adjuvantly. She understands regardless she will require systemic chemotherapy.  The logistics of administration of chemotherapy were discussed and likely  she will combination of chemotherapy in the form of Adriamycin Cytoxan given in a dose dense fashion followed by taxanes. Complications associated with this chemotherapy were discussed including nausea, vomiting, myelosuppression, hair loss as well as potential cardiac toxicity. Alternative regimens would include Cytoxan and taxanes combination without Adriamycin.  After discussion both neoadjuvant in the adjuvant approach of chemotherapy it appears clear to me that she is very distressed and very emotional about the persistence of this tumor her breast. She feels that the tumor has a constant reminder of her diagnosis which makes her very sad. After today's discussion, she is agreeable to proceed with upfront mastectomy if there is no contraindication findings on the MRI or the PET scan.  I have communicated these findings with Dr. Lucia Gaskins who will kindly also inserted a Port-A-Cath with surgery to administer chemotherapy postoperatively.  All questions were answered today to her satisfaction. A followup will be determined by the results of the MRI and the PET scan.  2. HIV disease: Seems to be under excellent control with her current regimen.  3. History of colon cancer it appears to have been an early  stage disease and she did receive adjuvant chemotherapy for a Port-A-Cath. There is no evidence of disease recurrence at this time.

## 2014-04-22 NOTE — Telephone Encounter (Signed)
Patient is staying in the system for treatment [facility] appt will ned to be canceled.

## 2014-04-22 NOTE — Telephone Encounter (Signed)
Gave pt appt for  Md

## 2014-04-22 NOTE — Progress Notes (Signed)
Please see consult note.  

## 2014-04-23 ENCOUNTER — Ambulatory Visit
Admission: RE | Admit: 2014-04-23 | Discharge: 2014-04-23 | Disposition: A | Discharge status: Home / Self Care | Payer: No Typology Code available for payment source | Source: Ambulatory Visit | Attending: Internal Medicine | Admitting: Internal Medicine

## 2014-04-23 ENCOUNTER — Encounter: Payer: Self-pay | Admitting: Radiation Oncology

## 2014-04-23 DIAGNOSIS — C50919 Malignant neoplasm of unspecified site of unspecified female breast: Secondary | ICD-10-CM

## 2014-04-23 MED ORDER — GADOBENATE DIMEGLUMINE 529 MG/ML IV SOLN
16.0000 mL | Freq: Once | INTRAVENOUS | Status: AC | PRN
  Administered 2014-04-23: 16 mL via INTRAVENOUS

## 2014-04-23 NOTE — Progress Notes (Addendum)
Location of Breast Cancer:Right Breast, Upper Inner Quadrant  Histology per Pathology Report:  04/09/14 Diagnosis 1. Breast, right, needle core biopsy, 2:30 4 cmfn - INVASIVE DUCTAL CARCINOMA. 2. Breast, right, needle core biopsy, 12:00 sa - FIBROSIS. - NO MALIGNANCY IDENTIFIED. 3. Breast, right, needle core biopsy, axillary lymph node -Positive - CARCINOMA. - SEE MICROSCOPIC DESCRIPTION  Receptor Status: ER(0%), PR (0%), Her2-neu (No Amplification), Ki-67 (19%)  Kelly Chavez presented with report of noticing "pain and "lumpiness" in her right breast in December. Prior to 04/02/14 she had not had a mammogram in 2 years.    04/02/14-Mammogram   1.Two right breast masses with abnormal intervening tissue  compatible with multifocal breast carcinoma, as described above.  2. Multiple abnormal appearing right axillary lymph nodes,  compatible with metastatic nodes.  3. No evidence of malignancy on the left.    Past/Anticipated interventions by surgeon, if any: Dr. Alphonsa Overall -Right Needle Core Biopsy of Right Breast and Right Axillary Lymph Node  Past/Anticipated interventions by medical oncology, if any: Chemotherapy Dr. Zola Button- 04/22/14 - Discussion regarding chemotherapy - Ms. Fairbairn has decided to have a mastectomy  Lymphedema issues, if any: no  Pain issues, if any: Occasionally she will mild have pain in her back, right shoulder and hips.  She doesn't take any medication to alleviate the pain.    SAFETY ISSUES:  Prior radiation? No  Pacemaker/ICD? No  Possible current pregnancy? No  Is the patient on methotrexate? No  Current Complaints / other details:  Hx of Colon Cancer 2011, Benign Brain Tumor in 2012, Pneumocystis Carinii Pneumonia(HIV)- Well Controlled

## 2014-04-24 ENCOUNTER — Telehealth: Payer: Self-pay | Admitting: Oncology

## 2014-04-24 ENCOUNTER — Ambulatory Visit
Admission: RE | Admit: 2014-04-24 | Discharge: 2014-04-24 | Disposition: A | Discharge status: Home / Self Care | Payer: No Typology Code available for payment source | Source: Ambulatory Visit | Attending: Radiation Oncology | Admitting: Radiation Oncology

## 2014-04-24 ENCOUNTER — Encounter: Payer: Self-pay | Admitting: Radiation Oncology

## 2014-04-24 VITALS — BP 161/141 | HR 69 | Temp 98.0°F | Resp 14 | Wt 179.7 lb

## 2014-04-24 DIAGNOSIS — C50219 Malignant neoplasm of upper-inner quadrant of unspecified female breast: Secondary | ICD-10-CM | POA: Diagnosis not present

## 2014-04-24 DIAGNOSIS — Z171 Estrogen receptor negative status [ER-]: Secondary | ICD-10-CM | POA: Diagnosis not present

## 2014-04-24 DIAGNOSIS — Z90710 Acquired absence of both cervix and uterus: Secondary | ICD-10-CM | POA: Insufficient documentation

## 2014-04-24 DIAGNOSIS — Z9221 Personal history of antineoplastic chemotherapy: Secondary | ICD-10-CM | POA: Diagnosis not present

## 2014-04-24 DIAGNOSIS — Z85038 Personal history of other malignant neoplasm of large intestine: Secondary | ICD-10-CM | POA: Diagnosis not present

## 2014-04-24 DIAGNOSIS — F411 Generalized anxiety disorder: Secondary | ICD-10-CM | POA: Diagnosis not present

## 2014-04-24 DIAGNOSIS — B20 Human immunodeficiency virus [HIV] disease: Secondary | ICD-10-CM | POA: Insufficient documentation

## 2014-04-24 DIAGNOSIS — F29 Unspecified psychosis not due to a substance or known physiological condition: Secondary | ICD-10-CM | POA: Diagnosis not present

## 2014-04-24 DIAGNOSIS — Z510 Encounter for antineoplastic radiation therapy: Secondary | ICD-10-CM | POA: Insufficient documentation

## 2014-04-24 DIAGNOSIS — C50211 Malignant neoplasm of upper-inner quadrant of right female breast: Secondary | ICD-10-CM

## 2014-04-24 HISTORY — DX: Idiopathic aseptic necrosis of left femur: M87.052

## 2014-04-24 HISTORY — DX: Malignant neoplasm of unspecified site of right female breast: C50.911

## 2014-04-24 HISTORY — DX: Asymptomatic human immunodeficiency virus (hiv) infection status: Z21

## 2014-04-24 HISTORY — DX: Human immunodeficiency virus (HIV) disease: B20

## 2014-04-24 HISTORY — DX: Idiopathic aseptic necrosis of right femur: M87.051

## 2014-04-24 NOTE — Progress Notes (Signed)
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 female who presented with a palpable lump in her right breast for 6-8 months with skin retraction. Right mammogram revealed a large mass in the right breast which on ultrasound in the 2:30 o'clock position was 3.3 cm in the subareolar region. Multiple abnormal lymph nodes were appreciated and one of these, measuring 2.4 cm, was biopsied. This was positive for carcinoma. She also underwent 2 biopsies in the breast, one at the 2:30 o'clock position which revealed grade 2-3 invasive ductal carcinoma which is triple negative. 12:00 position biopsy revealed benign tissue.   MRI of breasts on 7/9 is notable for R axillary adenopathy, a large biopsy proven UIQ/ upper central right breast mass, skin retraction, and second area of suspicion in the retroareolar breast (thought that the biopsy revealing fibrosis is adjacent to this suspicious region).  She expresses anxiety and has doubts whether she should undergo surgery first and therefore mastectomy, versus neoadjuvant chemotherapy. Tentatively, when she saw medical oncology this week, she was leaning towards mastectomy first. Her PET scan is still pending for next week.   She was over an hour late for her consultation today and very apologetic.  She reports that sometimes she feels "terrorized" by the system and is overwhelmed by her appointments and confusion as to when they are. She reports her son thinks she may have some dementia. She walks with a cane and ambulates slowly and with difficulty.    PREVIOUS RADIATION THERAPY: No  PAST MEDICAL  HISTORY:  has a past medical history of PCP (pneumocystis carinii pneumonia) (1994); Cancer of sigmoid colon (2011); Status post chemotherapy (2012); Brain tumor (benign); Avascular necrosis of bones of both hips; Invasive ductal carcinoma of right breast (04/09/14); and HIV (human immunodeficiency virus infection).    PAST SURGICAL HISTORY: Past Surgical History  Procedure Laterality Date  . Tonsillectomy    . Total abdominal hysterectomy w/ bilateral salpingoophorectomy  2012    FAMILY HISTORY: family history includes Dementia in her mother; Diabetes in her brother, father, and sister; Hyperlipidemia in her father and mother; Hypertension in her mother.  SOCIAL HISTORY:  reports that she has never smoked. She has never used smokeless tobacco. She reports that she does not drink alcohol or use illicit drugs.  ALLERGIES: Review of patient's allergies indicates no known allergies.  MEDICATIONS:  Current Outpatient Prescriptions  Medication Sig Dispense Refill  . atenolol (TENORMIN) 50 MG tablet Take 50 mg by mouth daily.      . darunavir (PREZISTA) 600 MG tablet Take 1 tablet (600 mg total) by mouth 2 (two) times daily with a meal.  60 tablet  6  . emtricitabine-tenofovir (TRUVADA) 200-300 MG per tablet Take 1 tablet by mouth daily.  30 tablet  6  . ritonavir (NORVIR) 100 MG capsule Take 1 capsule (100 mg total) by mouth 2 (two) times daily with a meal.  60 capsule  6   No current facility-administered medications for this encounter.    REVIEW OF SYSTEMS:  Notable for that above.   PHYSICAL EXAM:  weight is 179 lb 11.2 oz (81.511 kg). Her oral temperature is 98 F (  36.7 C). Her blood pressure is 161/141 and her pulse is 69. Her respiration is 14 and oxygen saturation is 100%.   General: In mild acute distress. Too weak to safely climb onto exam table. HEENT: Head is normocephalic.  Oropharynx is clear. Neck: Neck is supple, no palpable cervical or supraclavicular  lymphadenopathy. Heart: Regular in rate and rhythm   Chest: Clear to auscultation bilaterally, with no rhonchi, wheezes, or rales. Abdomen: Soft, nontender, nondistended, with no rigidity or guarding. Extremities: right wrist slightly greater girth than left Lymphatics: see breasts below Skin: No concerning lesions. Neurologic: Cranial nerves II through XII are grossly intact. No obvious focalities. Speech is fluent.  Breasts: 3.5 cm lesion in upper central right breast; also, small sub centimeter lesion superior to right areola.  2.5 cm mobile axillary node appreciated on right as well. Left side unremarkable. No skin ulceration.   ECOG = 2  0 - Asymptomatic (Fully active, able to carry on all predisease activities without restriction)  1 - Symptomatic but completely ambulatory (Restricted in physically strenuous activity but ambulatory and able to carry out work of a light or sedentary nature. For example, light housework, office work)  2 - Symptomatic, <50% in bed during the day (Ambulatory and capable of all self care but unable to carry out any work activities. Up and about more than 50% of waking hours)  3 - Symptomatic, >50% in bed, but not bedbound (Capable of only limited self-care, confined to bed or chair 50% or more of waking hours)  4 - Bedbound (Completely disabled. Cannot carry on any self-care. Totally confined to bed or chair)  5 - Death   Eustace Pen MM, Creech RH, Tormey DC, et al. [phone]). "Toxicity and response criteria of the [facility]". [place] Oncol. 5 (6): 649-55   LABORATORY DATA:  Lab Results  Component Value Date   WBC 6.1 01/22/2014   HGB 12.6 01/22/2014   HCT 37.0 01/22/2014   MCV 87.3 01/22/2014   PLT 271 01/22/2014   CMP     Component Value Date/Time   NA 140 01/22/2014 1503   K 4.1 01/22/2014 1503   CL 103 01/22/2014 1503   CO2 25 01/22/2014 1503   GLUCOSE 72 01/22/2014 1503   BUN 12 01/22/2014 1503   CREATININE 0.82 01/22/2014 1503    CALCIUM 9.3 01/22/2014 1503   PROT 7.7 01/22/2014 1503   ALBUMIN 3.9 01/22/2014 1503   AST 27 01/22/2014 1503   ALT 24 01/22/2014 1503   ALKPHOS 143* 01/22/2014 1503   BILITOT 0.3 01/22/2014 1503   GFRNONAA 74 01/22/2014 1503   GFRAA 86 01/22/2014 1503    6/25 biopsies revealed  Diagnosis 1. Breast, right, needle core biopsy, 2:30 4 cmfn - INVASIVE DUCTAL CARCINOMA. 2. Breast, right, needle core biopsy, 12:00 sa - FIBROSIS. - NO MALIGNANCY IDENTIFIED. 3. Breast, right, needle core biopsy, axillary lymph node - CARCINOMA. - SEE MICROSCOPIC DESCRIPTION Microscopic Comment 1. The 2:30 biopsy located 4 cm from the nipple shows invasive ductal carcinoma consistent with grade II. A breast prognostic profile will be performed. 3. There is carcinoma with similar morphologic features involving fibroadipose tissue with minimal associated lymphoid tissue and may represent a lymph node completely replaced by tumor..  The carcinoma is triple negative.      RADIOGRAPHY: Mr Breast Bilateral W Wo Contrast  04/23/2014   CLINICAL DATA:  Recent diagnosis of right breast cancer following ultrasound-guided biopsy of a palpable mass in the 2 o'clock position  of the right breast approximately 4 cm from the nipple. A second area of concern seen on ultrasound, in the retroareolar right breast, 12-2 o'clock region, was biopsied demonstrating benign fibrosis. A suspicious right axillary lymph node was biopsied, demonstrating carcinoma and no visible residual lymphoid tissue.  LABS:  BUN and creatinine were obtained on site at [facility] at  [address]  Results:  BUN 17 mg/dL,  Creatinine 1.1 mg/dL.  EXAM: BILATERAL BREAST MRI WITH AND WITHOUT CONTRAST  TECHNIQUE: Multiplanar, multisequence MR images of both breasts were obtained prior to and following the intravenous administration of 33m of MultiHance.  THREE-DIMENSIONAL MR IMAGE RENDERING ON INDEPENDENT WORKSTATION:  Three-dimensional MR images were rendered  by post-processing of the original MR data on an independent workstation. The three-dimensional MR images were interpreted, and findings are reported in the following complete MRI report for this study. Three dimensional images were evaluated at the independent DynaCad workstation  COMPARISON:  Previous exams  FINDINGS: Breast composition: b.  Scattered fibroglandular tissue.  Background parenchymal enhancement: Minimal  Right breast: The right breast is smaller than the left breast with skin retraction medially. There is a large irregular rim-enhancing mass in the upper inner quadrant of the right breast extending to the upper central right breast with associated biopsy clip artifact centrally. There are small satellite nodules extending anteriorly from the rim-enhancing mass. The dominant irregular mass measures approximately 2.2 x 2.4 x 2.8 cm.  There is focal skin thickening and retraction in the medial upper inner right breast overlying the superior aspect of the mass.  In the immediate retroareolar right breast, and extending to the skin surface of the areola there is a suspicious enhancing mass measuring 1.2 x 0.9 x 0.9 cm. The second biopsy clip in the retroareolar right breast is cephalad and deep to this suspicious area of enhancement on MRI.  In total, the suspicious enhancement in the right breast spans 5.2 cm AP diameter. No mass or suspicious enhancement is seen in the right pectoralis muscle.  Left breast: No mass or abnormal enhancement.  Lymph nodes: Multiple level 1 right axillary lymph nodes have suspicious features. The dominant lymph node measures 2.4 cm short axis and is felt the reflect of the lymph node which was recently biopsied.  No left axillary lymphadenopathy. No internal mammary chain lymphadenopathy identified.  Ancillary findings: The heart appears enlarged. There is a probable hiatal hernia. Distal esophageal wall thickening cannot be excluded, but is not certain. Consider  correlation with CT imaging if performed.  IMPRESSION: Large biopsy-proven carcinoma in the upper-inner quadrant/upper central right breast with adjacent skin thickening and skin retraction. Skin involvement by tumor cannot be excluded.  A second area of suspicious enhancement in the immediate retroareolar right breast is suspicious for multifocal carcinoma. It is suspected that the biopsy performed in the retroareolar right breast recently that demonstrated fibrosis reflects some fibrosis intervening between the 2 suspicious masses. The biopsy clip for the biopsied fibrosis is deep and cephalad to the suspicious finding on MRI. Tumor involvement of the right nipple/areolar complex cannot be excluded.  Right axillary lymphadenopathy consistent with biopsy proven metastatic carcinoma.  No evidence of malignancy in the left breast.  Question hiatal hernia and or distal esophageal wall thickening. Consider correlation with CT imaging if performed.  RECOMMENDATION: If breast conservation therapy is considered, then a second-look right breast ultrasound with possible biopsy is suggested to attempt to identified the immediate retroareolar right breast mass which is suspicious on MRI.  BI-RADS CATEGORY  6: Known biopsy-proven malignancy.   Electronically Signed   By: Curlene Dolphin M.D.   On: 04/23/2014 17:00   Mm Digital Diagnostic Bilat  04/02/2014   CLINICAL DATA:  Enlarging mass felt by the patient in the upper inner right breast for the past 6-8 months, with progressive skin retraction. Increasing pain at that location for the past 3 weeks.  EXAM: DIGITAL DIAGNOSTIC  BILATERAL MAMMOGRAM WITH CAD  ULTRASOUND RIGHT BREAST  COMPARISON:  None.  ACR Breast Density Category c: The breast tissue is heterogeneously dense, which may obscure small masses.  FINDINGS: There is a poorly defined, rounded mass with irregular, spiculated margins in the upper inner right breast, corresponding to the mass felt by the patient, marked  with a metallic marker. There is associated skin retraction and associated calcifications. There is mild skin thickening anteriorly. No findings on the left suspicious for malignancy. No mammographically visible lymph nodes.  Mammographic images were processed with CAD.  On physical exam, the patient has an approximately 4 cm firm, fixed, palpable mass in the 1:30 o'clock position of the right breast, 4 cm from the nipple. There is a questionable faintly palpable lymph node deep in the right axilla.  Ultrasound is performed, showing a 2.4 x 2.4 x 1.7 cm oval, markedly hypoechoic mass with irregular margins in the 1:30 o'clock position of the right breast, 4 cm from the nipple. This exhibits posterior acoustical shadowing and contains microcalcifications.  Also demonstrated is a 3.3 x 3.3 x 1.3 cm similar-appearing mass in the retronipple region. The 2 masses are approximately 1 cm apart with some irregular, poorly defined hypoechoic areas extending between the masses. The masses span an area measuring approximately 6.4 cm in maximum diameter.  Ultrasound of the right axilla is demonstrated multiple abnormal appearing lymph nodes. The these include a 2.4 cm rounded, bilobed, markedly hypoechoic node with loss of the normal fatty hilum.  IMPRESSION: 1. Two right breast masses with abnormal intervening tissue compatible with multifocal breast carcinoma, as described above. 2. Multiple abnormal appearing right axillary lymph nodes, compatible with metastatic nodes. 3. No evidence of malignancy on the left.  RECOMMENDATION: Ultrasound-guided core needle biopsies of both breast masses and the largest right axillary node. These have been scheduled for 1 p.m. on 04/09/2014.  I have discussed the findings and recommendations with the patient. Results were also provided in writing at the conclusion of the visit. If applicable, a reminder letter will be sent to the patient regarding the next appointment.  BI-RADS CATEGORY  5:  Highly suggestive of malignancy.   Electronically Signed   By: Enrique Sack M.D.   On: 04/02/2014 16:53   Mm Digital Diagnostic Unilat R  04/14/2014   ADDENDUM REPORT: 04/14/2014 11:37  ADDENDUM: A previous bilateral screening mammogram from [facility] in [place], [place], dated 01/02/2012, has been obtained for comparison. The current interpretation, impression and recommendation are unchanged.   Electronically Signed   By: Enrique Sack M.D.   On: 04/14/2014 11:37   04/14/2014   CLINICAL DATA:  Suspicious masses in the right breast and suspicious right axillary lymph node. Biopsies were performed of both areas in the breast and of a right axillary lymph node today.  EXAM: DIAGNOSTIC A choose 3 MAMMOGRAM POST ULTRASOUND BIOPSY  COMPARISON:  Previous exams  FINDINGS: Mammographic images were obtained following ultrasound guided biopsies of a palpable mass in the approximately 2 o'clock position 4 cm from the nipple and a suspicious mass in  the retroareolar right breast.  Ribbon shaped biopsy clip is satisfactorily positioned within the large spiculated mass in the upper inner quadrant of the right breast. When she a bottle clip is appropriately positioned at 12 o'clock position of the right breast retroareolar.  IMPRESSION: Satisfactory position of two biopsy clips in the right breast.  Final Assessment: Post Procedure Mammograms for Marker Placement  Electronically Signed: By: Curlene Dolphin M.D. On: 04/09/2014 16:32   Mm Radiologist Eval And Mgmt  04/10/2014   EXAM: ESTABLISHED PATIENT OFFICE VISIT - LEVEL II  CHIEF COMPLAINT: The patient returns with her son today to receive pathology results of 2 separate right breast biopsies and a right axillary lymph node biopsy performed 04/09/2014.  HISTORY OF PRESENT ILLNESS: The patient reports doing well option biopsies. The biopsy site did not bleed over night.  EXAM: The Steri-Strips and Band-Aids are in place and clean on all 3 biopsy sites. No  bruising is visualized.  PATHOLOGY: Pathology results of the 2:30 position biopsy 4 cm from the nipple in the right breast demonstrate invasive ductal carcinoma. Pathology results are concordant with imaging findings.  Pathology results of the 12 o'clock subareolar biopsy demonstrate benign fibrosis. Pathology results are concordant with imaging findings.  Pathology results of the suspicious right axillary lymph node demonstrate carcinoma, with no residual lymph node tissue visualized. Pathology results are concordant with imaging findings.  ASSESSMENT AND PLAN: ASSESSMENT AND PLAN Surgical consultation has been scheduled for the patient with Dr. Lucia Gaskins at [facility] [place] [facility] on April 15, 2014. Bilateral breast MRI has been scheduled for the patient on April 16, 2014. The patient's questions were answered and she was given educational materials about breast cancer.   Electronically Signed   By: Curlene Dolphin M.D.   On: 04/10/2014 14:01   US Breast Ltd Uni Right Inc Axilla  04/02/2014   CLINICAL DATA:  Enlarging mass felt by the patient in the upper inner right breast for the past 6-8 months, with progressive skin retraction. Increasing pain at that location for the past 3 weeks.  EXAM: DIGITAL DIAGNOSTIC  BILATERAL MAMMOGRAM WITH CAD  ULTRASOUND RIGHT BREAST  COMPARISON:  None.  ACR Breast Density Category c: The breast tissue is heterogeneously dense, which may obscure small masses.  FINDINGS: There is a poorly defined, rounded mass with irregular, spiculated margins in the upper inner right breast, corresponding to the mass felt by the patient, marked with a metallic marker. There is associated skin retraction and associated calcifications. There is mild skin thickening anteriorly. No findings on the left suspicious for malignancy. No mammographically visible lymph nodes.  Mammographic images were processed with CAD.  On physical exam, the patient has an approximately 4 cm firm, fixed, palpable mass in the  1:30 o'clock position of the right breast, 4 cm from the nipple. There is a questionable faintly palpable lymph node deep in the right axilla.  Ultrasound is performed, showing a 2.4 x 2.4 x 1.7 cm oval, markedly hypoechoic mass with irregular margins in the 1:30 o'clock position of the right breast, 4 cm from the nipple. This exhibits posterior acoustical shadowing and contains microcalcifications.  Also demonstrated is a 3.3 x 3.3 x 1.3 cm similar-appearing mass in the retronipple region. The 2 masses are approximately 1 cm apart with some irregular, poorly defined hypoechoic areas extending between the masses. The masses span an area measuring approximately 6.4 cm in maximum diameter.  Ultrasound of the right axilla is demonstrated multiple abnormal appearing lymph nodes. The  these include a 2.4 cm rounded, bilobed, markedly hypoechoic node with loss of the normal fatty hilum.  IMPRESSION: 1. Two right breast masses with abnormal intervening tissue compatible with multifocal breast carcinoma, as described above. 2. Multiple abnormal appearing right axillary lymph nodes, compatible with metastatic nodes. 3. No evidence of malignancy on the left.  RECOMMENDATION: Ultrasound-guided core needle biopsies of both breast masses and the largest right axillary node. These have been scheduled for 1 p.m. on 04/09/2014.  I have discussed the findings and recommendations with the patient. Results were also provided in writing at the conclusion of the visit. If applicable, a reminder letter will be sent to the patient regarding the next appointment.  BI-RADS CATEGORY  5: Highly suggestive of malignancy.   Electronically Signed   By: Enrique Sack M.D.   On: 04/02/2014 16:53   [place] Rt Breast Bx W Loc Dev 1st Lesion Img Bx Spec US Guide  04/09/2014   CLINICAL DATA:  68 year old patient with suspicious right breast masses and suspicious right axillary lymph node. This report describes biopsy of the palpable mass at 2 o'clock 4 cm  from the nipple.  EXAM: ULTRASOUND GUIDED RIGHT BREAST CORE NEEDLE BIOPSY  COMPARISON:  Previous exams.  PROCEDURE: I met with the patient and we discussed the procedure of ultrasound-guided biopsy, including benefits and alternatives. We discussed the high likelihood of a successful procedure. We discussed the risks of the procedure including infection, bleeding, tissue injury, clip migration, and inadequate sampling. Informed written consent was given. The usual time-out protocol was performed immediately prior to the procedure.  Using sterile technique and 2% Lidocaine as local anesthetic, under direct ultrasound visualization, a 12 gauge vacuum-assisted device was used to perform biopsy of a palpable mass in the approximately 2 o'clock position of the right breast 4 cm from the nipple using a lateral to medial approach. At the conclusion of the procedure, a ribbon shaped tissue marker clip was deployed into the biopsy cavity. Follow-up 2-view mammogram was performed and dictated separately.  IMPRESSION: Ultrasound-guided biopsy of palpable right breast mass 2 o'clock position 4 cm from the nipple. No apparent complications.   Electronically Signed   By: Curlene Dolphin M.D.   On: 04/09/2014 16:25   [place] Rt Breast Bx W Loc Dev Ea Add Lesion Img Bx Spec US Guide  04/09/2014   CLINICAL DATA:  68 year old patient with suspicious right breast masses and suspicious right axillary lymph node.  EXAM: ULTRASOUND GUIDED CORE NEEDLE BIOPSY OF A RIGHT AXILLARY NODE  COMPARISON:  Previous exams.  FINDINGS: I met with the patient and we discussed the procedure of ultrasound-guided biopsy, including benefits and alternatives. We discussed the high likelihood of a successful procedure. We discussed the risks of the procedure, including infection, bleeding, tissue injury, clip migration, and inadequate sampling. Informed written consent was given. The usual time-out protocol was performed immediately prior to the procedure.   Using sterile technique and 2% Lidocaine as local anesthetic, under direct ultrasound visualization, a 12 gauge vacuum assisted device was used to perform biopsy of a suspicious, enlarged and hypoechoic right axillary lymph node using a lateral to medial approach.  IMPRESSION: Ultrasound guided biopsy of right axillary lymph node. No apparent complications.   Electronically Signed   By: Curlene Dolphin M.D.   On: 04/09/2014 16:29   [place] Rt Breast Bx W Loc Dev Ea Add Lesion Img Bx Spec US Guide  04/09/2014   CLINICAL DATA:  68 year old patient with suspicious right breast masses  and suspicious right axillary lymph node. Post biopsy describes the suspicious mass in the retroareolar right breast that spans approximately 12:00 to 2:00 retroareolar  EXAM: ULTRASOUND GUIDED RIGHT BREAST CORE NEEDLE BIOPSY  COMPARISON:  Previous exams.  PROCEDURE: I met with the patient and we discussed the procedure of ultrasound-guided biopsy, including benefits and alternatives. We discussed the high likelihood of a successful procedure. We discussed the risks of the procedure including infection, bleeding, tissue injury, clip migration, and inadequate sampling. Informed written consent was given. The usual time-out protocol was performed immediately prior to the procedure.  Using sterile technique and 2% Lidocaine as local anesthetic, under direct ultrasound visualization, a 12 gauge vacuum-assisted device was used to perform biopsy of a suspicious mass in the retroareolar right breast using a lateral to medial approach. At the conclusion of the procedure, a wing shaped tissue marker clip was deployed into the biopsy cavity. Follow-up 2-view mammogram was performed and dictated separately.  IMPRESSION: Ultrasound-guided biopsy of right breast mass, retroareolar. No apparent complications.   Electronically Signed   By: Curlene Dolphin M.D.   On: 04/09/2014 16:28      IMPRESSION/PLAN: Lovely 68 yo woman with triple negative T2N1Mx  breast cancer, right.  1) PET pending.  We discussed the purpose of the PET scan and the potential plans if she does not demonstrate distant metastatic disease.  2) For curative treatment for local regional breast cancer, she understands that the standard treatment would be either mastectomy followed by chemotherapy and then radiation. Or, neoadjuvant chemotherapy followed by surgery followed by radiation. Neoadjuvant chemotherapy may facilitate breast conservation but this is not a guarantee. She may need another biopsy of the breast based on her recent MRI to verify if breast conservation is feasible.  3) we briefly discussed the logistics, risks, benefits, and potential side effects of radiotherapy for locally advanced breast cancer. She is clearly a bit overwhelmed by all the information that she is received over the past couple weeks, and I assured her that I would reiterate all of this information to her next time we meet. I anticipate that she will be referred back to me once ready for radiation planning.  4) she reports a great deal of anxiety today so we will have her see our social worker today.  5) continue to have her HIV managed by infectious disease department  6) patient reports feeling overwhelmed and sometimes confused and expresses that her son feels she may have some dementia. After visit summary given to her son to help minimize confusion in future. Her son was only present for far part of the examination but not during this part of the discussion (per patient preference).  ConsiderMini-Mental status exam and possible referral to neurology once the majority of her breast cancer treatments have been addressed. No other outright complaints to suggest brain metastases at time time.   _________________________________________   Eppie Gibson, MD

## 2014-04-24 NOTE — Progress Notes (Signed)
She rates her pain as a 3 on a scale of 0-10. Pt complains of , Loss of Sleep and Fatigue.  Pt right breast  negative for rash. No edema noted The patient eats a regular, healthy diet.Marland Kitchen

## 2014-04-24 NOTE — Progress Notes (Signed)
Please see the Nurse Progress Note in the MD Initial Consult Encounter for this patient. 

## 2014-04-24 NOTE — Telephone Encounter (Signed)
Called and left a message for Kelly Chavez regarding her consult appointment with Dr. Isidore Moos.  Asked that she call back at [phone].

## 2014-04-27 ENCOUNTER — Encounter: Payer: Self-pay | Admitting: *Deleted

## 2014-04-27 NOTE — Progress Notes (Signed)
[facility] Psychosocial Distress Screening Clinical Social Work  Clinical Social Work was referred by distress screening protocol.  The patient scored a 7 on the Psychosocial Distress Thermometer which indicates moderate distress. Clinical Social Worker met with patient and patient's son in [place] exam room to assess for distress and other psychosocial needs.  Patient stated she was initially very anxious, but "feels better" after speaking with the physician in regards to her treatment plan.  Patient did express some emotional distress and need for additional support.  Patient currently lives in [place] and her son is her primary support person.  Patient does have a history with mental health/emotional issues, and was open when discussing them with CSW.  Patient currently sees a counselor at [employer] [employer], but is interested in additional support.  CSW provided pt with information on support programs at [facility], but encouraged pt to consider long term support in the community.  Patient was open and appreciative of the information and gave CSW permission to contact counselor at [facility] and research additional support services.  CSw contacted counselor at [facility] and is waiting returned phone call.  CSW encouraged pt or pt's son to call with any questions or concerns.  CSW will continue to research/explore appropriate resources for patient in coordination with services being provided at [facility].         Clinical Social Worker follow up needed: Yes.    If yes, follow up plan: CSW will work in coordination with [facility] to insure appropriate resources are provided. Connect patient with resources within the community that can provide additional support. Long term counseling/Mental Health    ONCBCN DISTRESS SCREENING 04/24/2014  Screening Type Initial Screening  Elta Guadeloupe the number that describes how much distress you have been experiencing in the past week 7  Practical problem type Housing  Emotional problem type Adjusting to  illness;Adjusting to appearance changes  Physical Problem type Pain;Bathing/dressing;Changes in urination;Tingling hands/feet  Referral to clinical social work Yes  Other being terrorized, apearance   Johnnye Lana, MSW, [place] [phone]

## 2014-04-30 ENCOUNTER — Encounter (HOSPITAL_COMMUNITY): Payer: Self-pay

## 2014-04-30 ENCOUNTER — Ambulatory Visit (HOSPITAL_COMMUNITY)
Admission: RE | Admit: 2014-04-30 | Discharge: 2014-04-30 | Disposition: A | Discharge status: Home / Self Care | Payer: No Typology Code available for payment source | Source: Ambulatory Visit | Attending: Oncology | Admitting: Oncology

## 2014-04-30 DIAGNOSIS — Z85038 Personal history of other malignant neoplasm of large intestine: Secondary | ICD-10-CM | POA: Insufficient documentation

## 2014-04-30 DIAGNOSIS — C50211 Malignant neoplasm of upper-inner quadrant of right female breast: Secondary | ICD-10-CM

## 2014-04-30 DIAGNOSIS — R599 Enlarged lymph nodes, unspecified: Secondary | ICD-10-CM | POA: Diagnosis not present

## 2014-04-30 DIAGNOSIS — C50919 Malignant neoplasm of unspecified site of unspecified female breast: Secondary | ICD-10-CM | POA: Diagnosis not present

## 2014-04-30 LAB — GLUCOSE, CAPILLARY: Glucose-Capillary: 84 mg/dL (ref 70–99)

## 2014-04-30 MED ORDER — FLUDEOXYGLUCOSE F - 18 (FDG) INJECTION: 7.1000 | Freq: Once | INTRAVENOUS | Status: DC | PRN

## 2014-04-30 MED ORDER — FLUDEOXYGLUCOSE F - 18 (FDG) INJECTION: 9.7000 | Freq: Once | INTRAVENOUS | Status: AC | PRN

## 2014-05-07 ENCOUNTER — Telehealth: Payer: Self-pay | Admitting: *Deleted

## 2014-05-07 NOTE — Telephone Encounter (Signed)
Spoke to pt concerning PET results. Pt in question of what her next step are. Informed pt that according to her physician notes she would receive surgery with port followed by chemotherapy. Received verbal understanding. Pt would like to hear from Drs. Shadad and [employer] on her next step. This RN has notified the physicians of her request. Pt denies further needs at this time.

## 2014-05-11 ENCOUNTER — Ambulatory Visit: Payer: 59

## 2014-05-11 ENCOUNTER — Ambulatory Visit: Payer: No Typology Code available for payment source

## 2014-05-11 ENCOUNTER — Encounter: Payer: Self-pay | Admitting: Internal Medicine

## 2014-05-11 ENCOUNTER — Ambulatory Visit (INDEPENDENT_AMBULATORY_CARE_PROVIDER_SITE_OTHER): Payer: No Typology Code available for payment source | Admitting: Internal Medicine

## 2014-05-11 VITALS — BP 170/98 | HR 79 | Temp 98.1°F | Resp 16 | Ht 65.0 in | Wt 179.0 lb

## 2014-05-11 DIAGNOSIS — F22 Delusional disorders: Secondary | ICD-10-CM

## 2014-05-11 DIAGNOSIS — E785 Hyperlipidemia, unspecified: Secondary | ICD-10-CM

## 2014-05-11 DIAGNOSIS — R609 Edema, unspecified: Secondary | ICD-10-CM

## 2014-05-11 DIAGNOSIS — R600 Localized edema: Secondary | ICD-10-CM | POA: Insufficient documentation

## 2014-05-11 DIAGNOSIS — I10 Essential (primary) hypertension: Secondary | ICD-10-CM

## 2014-05-11 DIAGNOSIS — E559 Vitamin D deficiency, unspecified: Secondary | ICD-10-CM

## 2014-05-11 MED ORDER — HYDROCHLOROTHIAZIDE 12.5 MG PO CAPS: 12.5000 mg | ORAL_CAPSULE | Freq: Every day | ORAL | Status: DC

## 2014-05-11 NOTE — Progress Notes (Signed)
Patient ID: Kelly Chavez, female   DOB: 1946/06/26, 68 y.o.   MRN: [id number]   Kelly Chavez, is a 68 y.o. female  QMG:[id number]  [id number]  DOB - 27-Aug-1946  CC:  Chief Complaint  Patient presents with  . Establish Care       HPI: Kelly Chavez is a 68 y.o. female here today to establish medical care. She recently relocated to [facility] from the [place] area. She has a history of HIV and is currently under the care of Dr. Baxter Flattery.  Pt reports that she was diagnosed with HIV in 1986 but sought treatment only in 2015 as she was in denial and could not reveal her diagnosis to anyone in her family and Professional circle. She was also treated for colon cancer in 2011 with a partial colectomy resulting in cure [facility]).   Pt also states that she has feeling of paranoia from time to time where she perceives that people are talking about her. She denies hearing voices, or having hallucinations. She has never been diagnosed with any mental illnesses.  Pt's blood pressure elevated today but patient reports that she forgot to take BP today.  Patient has No headache, No chest pain, No abdominal pain - No Nausea, No new weakness tingling or numbness, No Cough - SOB.  No Known Allergies Past Medical History  Diagnosis Date  . PCP (pneumocystis carinii pneumonia) 1994  . Cancer of sigmoid colon 2011    sigmoid colectomy   . Status post chemotherapy 2012  . Brain tumor (benign)   . Avascular necrosis of bones of both hips   . 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 HIV (human immunodeficiency virus infection)    Current Outpatient Prescriptions on File Prior to Visit  Medication Sig Dispense Refill  . atenolol (TENORMIN) 50 MG tablet Take 50 mg by mouth daily.      . darunavir (PREZISTA) 600 MG tablet Take 1 tablet (600 mg total) by mouth 2 (two) times daily with a meal.  60 tablet  6  . emtricitabine-tenofovir (TRUVADA) 200-300 MG per tablet Take  1 tablet by mouth daily.  30 tablet  6  . ritonavir (NORVIR) 100 MG capsule Take 1 capsule (100 mg total) by mouth 2 (two) times daily with a meal.  60 capsule  6   No current facility-administered medications on file prior to visit.   Family History  Problem Relation Age of Onset  . Diabetes Father   . Hyperlipidemia Father   . Hypertension Mother   . Hyperlipidemia Mother   . Dementia Mother   . Diabetes Sister   . Diabetes Brother    History   Social History  . Marital Status: Widowed    Spouse Name: N/A    Number of Children: N/A  . Years of Education: N/A   Occupational History  . Not on file.   Social History Main Topics  . Smoking status: Never Smoker   . Smokeless tobacco: Never Used  . Alcohol Use: No  . Drug Use: No  . Sexual Activity: Not Currently    Partners: Male    Birth Control/ Protection: Abstinence   Other Topics Concern  . Not on file   Social History Narrative  . No narrative on file    Review of Systems: Constitutional: Negative for fever, chills, diaphoresis, activity change, appetite change and fatigue. HENT: Negative for ear pain, nosebleeds, congestion, facial swelling, rhinorrhea, neck pain, neck stiffness and ear discharge.  Eyes: Negative for pain, discharge, redness, itching and visual disturbance. Respiratory: Negative for cough, choking, chest tightness, shortness of breath, wheezing and stridor.  Cardiovascular: Negative for chest pain, palpitations and leg swelling. Gastrointestinal: Negative for abdominal distention. Genitourinary: Negative for dysuria, urgency, frequency, hematuria, flank pain, decreased urine volume, difficulty urinating and dyspareunia.  Musculoskeletal: Negative for back pain, joint swelling, arthralgia and gait problem. Neurological: Negative for dizziness, tremors, seizures, syncope, facial asymmetry, speech difficulty, weakness, light-headedness, numbness and headaches.  Hematological: Negative for  adenopathy. Does not bruise/bleed easily. Psychiatric/Behavioral: Negative for hallucinations, behavioral problems, confusion, dysphoric mood, decreased concentration and agitation.    Objective:   Filed Vitals:   05/11/14 1611  BP: 170/98  Pulse:   Temp:   Resp:     Physical Exam: Constitutional: Patient appears well-developed and well-nourished. No distress. HENT: Normocephalic, atraumatic, External right and left ear normal. Oropharynx is clear and moist.  Eyes: Conjunctivae and EOM are normal. PERRLA, no scleral icterus. Neck: Normal ROM. Neck supple. No JVD. No tracheal deviation. No thyromegaly. CVS: RRR, S1/S2 +, no murmurs, no gallops, no carotid bruit.  Pulmonary: Effort and breath sounds normal, no stridor, rhonchi, wheezes, rales.  Abdominal: Soft. BS +, no distension, tenderness, rebound or guarding.  Musculoskeletal: Normal range of motion. No edema and no tenderness.  Lymphadenopathy: No lymphadenopathy noted, cervical, inguinal or axillary Neuro: Alert. Normal reflexes, muscle tone coordination. No cranial nerve deficit. Skin: Skin is warm and dry. No rash noted. Not diaphoretic. No erythema. No pallor. Psychiatric: Normal mood and affect. Behavior, judgment, thought content normal.  Lab Results  Component Value Date   WBC 6.1 01/22/2014   HGB 12.6 01/22/2014   HCT 37.0 01/22/2014   MCV 87.3 01/22/2014   PLT 271 01/22/2014   Lab Results  Component Value Date   CREATININE 0.82 01/22/2014   BUN 12 01/22/2014   NA 140 01/22/2014   K 4.1 01/22/2014   CL 103 01/22/2014   CO2 25 01/22/2014    No results found for this basename: HGBA1C   Lipid Panel     Component Value Date/Time   CHOL 226* 01/22/2014 1503   TRIG 106 01/22/2014 1503   HDL 46 01/22/2014 1503   CHOLHDL 4.9 01/22/2014 1503   VLDL 21 01/22/2014 1503   LDLCALC 159* 01/22/2014 1503       Assessment and plan:   1. Essential hypertension, benign - Will continue current medications as patient claims she did not take her  medications and is resistant to any changes ion her medications. She reports pedal edema so will check LVF.  - hydrochlorothiazide (MICROZIDE) 12.5 MG capsule; Take 1 capsule (12.5 mg total) by mouth daily.  Dispense: 30 capsule; Refill: 3 - 2D Echocardiogram with contrast; Future - EKG 12-Lead  2. Pedal edema - hydrochlorothiazide (MICROZIDE) 12.5 MG capsule; Take 1 capsule (12.5 mg total) by mouth daily.  Dispense: 30 capsule; Refill: 3 - 2D Echocardiogram with contrast; Future - EKG 12-Lead  3. Hyperlipidemia - Reviewed lipid panel from 01/22/2014. ASCVD 10 year risk 22.6%. I have discussed medications and risk with HAART therapy. Fluvastatin has no interactions with current medications. However, patient still reluctant. Will order Fluvastatin at 40 mg BID and patient will decide if she will proceed.   4. Unspecified vitamin D deficiency - Will supplement - Vitamin D, 25-hydroxy  5. Paranoia - Will evaluate further at next visit. Pt neither suicidal or homicidal.   Return in about 1 month (around 06/11/2014) for B/L pedal examination, Annual  Physical, HTN.  The patient was given clear instructions to go to ER or return to medical center if symptoms don't improve, worsen or new problems develop. The patient verbalized understanding. The patient was told to call to get lab results if they haven't heard anything in the next week.     This note has been created with Surveyor, quantity. Any transcriptional errors are unintentional.    Kelly Agar A., MD [place], [place]   05/11/2014, 4:41 PM

## 2014-05-12 ENCOUNTER — Other Ambulatory Visit: Payer: Self-pay | Admitting: *Deleted

## 2014-05-12 DIAGNOSIS — B20 Human immunodeficiency virus [HIV] disease: Secondary | ICD-10-CM

## 2014-05-14 ENCOUNTER — Other Ambulatory Visit: Payer: Self-pay | Admitting: *Deleted

## 2014-05-14 DIAGNOSIS — B20 Human immunodeficiency virus [HIV] disease: Secondary | ICD-10-CM

## 2014-05-14 MED ORDER — EMTRICITABINE-TENOFOVIR DF 200-300 MG PO TABS: 1.0000 | ORAL_TABLET | Freq: Every day | ORAL | Status: DC

## 2014-05-14 MED ORDER — RITONAVIR 100 MG PO CAPS: 100.0000 mg | ORAL_CAPSULE | Freq: Two times a day (BID) | ORAL | Status: DC

## 2014-05-14 MED ORDER — DARUNAVIR ETHANOLATE 600 MG PO TABS: 600.0000 mg | ORAL_TABLET | Freq: Two times a day (BID) | ORAL | Status: DC

## 2014-05-14 NOTE — Telephone Encounter (Signed)
90-day w/ refills.

## 2014-05-15 ENCOUNTER — Other Ambulatory Visit: Payer: Self-pay | Admitting: *Deleted

## 2014-05-27 ENCOUNTER — Ambulatory Visit (HOSPITAL_BASED_OUTPATIENT_CLINIC_OR_DEPARTMENT_OTHER): Payer: No Typology Code available for payment source | Admitting: Oncology

## 2014-05-27 ENCOUNTER — Telehealth: Payer: Self-pay | Admitting: Oncology

## 2014-05-27 VITALS — BP 146/67 | HR 52 | Temp 98.3°F | Resp 17 | Ht 65.0 in | Wt 181.7 lb

## 2014-05-27 DIAGNOSIS — C773 Secondary and unspecified malignant neoplasm of axilla and upper limb lymph nodes: Secondary | ICD-10-CM

## 2014-05-27 DIAGNOSIS — C50211 Malignant neoplasm of upper-inner quadrant of right female breast: Secondary | ICD-10-CM

## 2014-05-27 DIAGNOSIS — C50219 Malignant neoplasm of upper-inner quadrant of unspecified female breast: Secondary | ICD-10-CM

## 2014-05-27 DIAGNOSIS — Z171 Estrogen receptor negative status [ER-]: Secondary | ICD-10-CM

## 2014-05-27 NOTE — Telephone Encounter (Signed)
Pt confirmed labs/ov per 08/12 POF, gave pt AVS....KJ °

## 2014-05-27 NOTE — Progress Notes (Signed)
Hematology and Oncology Follow Up Visit  Kelly Chavez [id number] 1946/02/14 68 y.o. 05/27/2014 1:53 PM [name], MDSnider, [name], MD   Principle Diagnosis: 68 year old woman with stage IIB (T2 N1) right-sided breast cancer. 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   Prior Therapy: He is status post biopsy on 04/09/2014 which confirmed the presence of invasive carcinoma with axillary lymph node involvement.  Current therapy: Under evaluation for future therapy.  Interim History:  Kelly Chavez presents today for a followup visit. Since her last visit, she underwent a PET scan and an MRI of the breast and she still conflicted about the next step of treatment. She is still complaining about right breast and right arm pain related to the tumor. She is emotionally better at this time and less depressed.She does not report any constitutional symptoms of fevers or chills or sweats. Has not reported any weight loss or appetite changes. She has not reported any headaches or blurry vision or syncope. She has not reported any seizure activity. Not reporting any chest pain shortness of breath cough or hemoptysis. She she has not reported any nausea or vomiting or abdominal pain. Has not reported any frequency urgency or hesitancy. Has not reported any skeletal complaints. Has not reported any arthralgias or myalgias. Rest of the review of systems unremarkable.    Medications: I have reviewed the patient's current medications.  Current Outpatient Prescriptions  Medication Sig Dispense Refill  . amLODipine (NORVASC) 10 MG tablet       . atenolol (TENORMIN) 50 MG tablet Take 50 mg by mouth daily.      . darunavir (PREZISTA) 600 MG tablet Take 1 tablet (600 mg total) by mouth 2 (two) times daily with a meal.  180 tablet  3  . emtricitabine-tenofovir (TRUVADA) 200-300 MG per tablet Take 1 tablet by mouth daily.  90  tablet  3  . hydrochlorothiazide (MICROZIDE) 12.5 MG capsule Take 1 capsule (12.5 mg total) by mouth daily.  30 capsule  3  . ritonavir (NORVIR) 100 MG capsule Take 1 capsule (100 mg total) by mouth 2 (two) times daily with a meal.  180 capsule  3   No current facility-administered medications for this visit.     Allergies: No Known Allergies  Past Medical History, Surgical history, Social history, and Family History were reviewed and updated.  Physical Exam: Blood pressure 146/67, pulse 52, temperature 98.3 F (36.8 C), temperature source Oral, resp. rate 17, height 5' 5"  (1.651 m), weight 181 lb 11.2 oz (82.419 kg), SpO2 100.00%. ECOG: 1 General appearance: alert and cooperative Head: Normocephalic, without obvious abnormality Neck: no adenopathy Lymph nodes: Cervical, supraclavicular, and axillary nodes normal. Heart:regular rate and rhythm, S1, S2 normal, no murmur, click, rub or gallop Lung:Heart exam - S1, S2 normal, no murmur, no gallop, rate regular Abdomin: soft, non-tender, without masses or organomegaly EXT:no erythema, induration, or nodules   Lab Results: Lab Results  Component Value Date   WBC 6.1 01/22/2014   HGB 12.6 01/22/2014   HCT 37.0 01/22/2014   MCV 87.3 01/22/2014   PLT 271 01/22/2014     Chemistry      Component Value Date/Time   NA 140 01/22/2014 1503   K 4.1 01/22/2014 1503   CL 103 01/22/2014 1503   CO2 25 01/22/2014 1503   BUN 12 01/22/2014 1503   CREATININE 0.82 01/22/2014 1503      Component  Value Date/Time   CALCIUM 9.3 01/22/2014 1503   ALKPHOS 143* 01/22/2014 1503   AST 27 01/22/2014 1503   ALT 24 01/22/2014 1503   BILITOT 0.3 01/22/2014 1503      EXAM:  BILATERAL BREAST MRI WITH AND WITHOUT CONTRAST  TECHNIQUE:  Multiplanar, multisequence MR images of both breasts were obtained  prior to and following the intravenous administration of 27m of  MultiHance.  THREE-DIMENSIONAL MR IMAGE RENDERING ON INDEPENDENT WORKSTATION:  Three-dimensional MR images were  rendered by post-processing of the  original MR data on an independent workstation. The  three-dimensional MR images were interpreted, and findings are  reported in the following complete MRI report for this study. Three  dimensional images were evaluated at the independent Dyna[employer]ad  workstation  COMPARISON: Previous exams  FINDINGS:  Breast composition: b. Scattered fibroglandular tissue.  Background parenchymal enhancement: Minimal  Right breast: The right breast is smaller than the left breast with  skin retraction medially. There is a large irregular rim-enhancing  mass in the upper inner quadrant of the right breast extending to  the upper central right breast with associated biopsy clip artifact  centrally. There are small satellite nodules extending anteriorly  from the rim-enhancing mass. The dominant irregular mass measures  approximately 2.2 x 2.4 x 2.8 cm.  There is focal skin thickening and retraction in the medial upper  inner right breast overlying the superior aspect of the mass.  In the immediate retroareolar right breast, and extending to the  skin surface of the areola there is a suspicious enhancing mass  measuring 1.2 x 0.9 x 0.9 cm. The second biopsy clip in the  retroareolar right breast is cephalad and deep to this suspicious  area of enhancement on MRI.  In total, the suspicious enhancement in the right breast spans 5.2  cm AP diameter. No mass or suspicious enhancement is seen in the  right pectoralis muscle.  Left breast: No mass or abnormal enhancement.  Lymph nodes: Multiple level 1 right axillary lymph nodes have  suspicious features. The dominant lymph node measures 2.4 cm short  axis and is felt the reflect of the lymph node which was recently  biopsied.  No left axillary lymphadenopathy. No internal mammary chain  lymphadenopathy identified.  Ancillary findings: The heart appears enlarged. There is a probable  hiatal hernia. Distal esophageal wall  thickening cannot be excluded,  but is not certain. Consider correlation with CT imaging if  performed.  IMPRESSION:  Large biopsy-proven carcinoma in the upper-inner quadrant/upper  central right breast with adjacent skin thickening and skin  retraction. Skin involvement by tumor cannot be excluded.  A second area of suspicious enhancement in the immediate  retroareolar right breast is suspicious for multifocal carcinoma. It  is suspected that the biopsy performed in the retroareolar right  breast recently that demonstrated fibrosis reflects some fibrosis  intervening between the 2 suspicious masses. The biopsy clip for the  biopsied fibrosis is deep and cephalad to the suspicious finding on  MRI. Tumor involvement of the right nipple/areolar complex cannot be  excluded.  Right axillary lymphadenopathy consistent with biopsy proven  metastatic carcinoma.  No evidence of malignancy in the left breast.  Question hiatal hernia and or distal esophageal wall thickening.  Consider correlation with CT imaging if performed.  RECOMMENDATION:  If breast conservation therapy is considered, then a second-look  right breast ultrasound with possible biopsy is suggested to attempt  to identified the immediate retroareolar right breast mass which is  suspicious on MRI.  BI-RADS CATEGORY 6: Known biopsy-proven malignancy.   EXAM:  NUCLEAR MEDICINE PET SKULL BASE TO THIGH  TECHNIQUE:  9.7 mCi F-18 FDG was injected intravenously. Full-ring PET imaging  was performed from the skull base to thigh after the radiotracer. CT  data was obtained and used for attenuation correction and anatomic  localization.  FASTING BLOOD GLUCOSE: Value: 83 mg/dl  COMPARISON: Breast MRI 04/23/2014 are  FINDINGS:  NECK  No hypermetabolic lymph nodes in the neck.  CHEST  There is a hypermetabolic mass in the right breast with SUV max  equal in 10.3 (image 57). Are second small hypermetabolic focus just  beneath the  skin surface (image 63).  There is enlarged hypermetabolic right axilla node measuring 20 mm  (image 58, series 4) with SUV max the wall 12.1. There are no  hypermetabolic infraclavicular or internal mammary lymph nodes. No  hypermetabolic mediastinal hilar lymph nodes.  View of the lung parenchyma demonstrates no suspicious pulmonary  nodules or hypermetabolic nodules.  ABDOMEN/PELVIS  No abnormal hypermetabolic activity within the liver, pancreas,  adrenal glands, or spleen. No hypermetabolic lymph nodes in the  abdomen or pelvis.  SKELETON  No focal hypermetabolic activity to suggest skeletal metastasis.  IMPRESSION:  1. Hypermetabolic right breast mass with hypermetabolic right  axillary nodal metastasis.  2. No evidence of more central nodal metastasis.  3. No evidence of pulmonary metastasis or distant metastasis    Impression and Plan:  68 year old woman with the following issues:  1. Recent diagnosis of invasive ductal carcinoma of the right breast presented with a 4 cm mass as well as palpableadenopathy. The biopsy confirmed these findings and showed that the tumor is triple negative breast cancer. Options of treatments were discussed today extensively. The logistics of neoadjuvant chemotherapy were discussed compared to adjuvant chemotherapy after a mastectomy. She still undecided about which way to go but I shared with her my preference at this point. I prefer and a front mastectomy followed by adjuvant chemotherapy for multiple reasons. This approach will spare her an extra procedure for a Port-A-Cath insertion and will possibly relieve her from the pain and the distress associated with her breast tumor. Given the multifocal nature of her disease, breast conservation would be unlikely especially in the setting of another tumor and the retroareolar area. I discussed the results of the PET scan and the MRI with her at this point and she is satisfied with our discussion.  The plan  is to discuss this with Dr. Lucia Gaskins about the possibility of doing a mastectomy up front and Port-A-Cath insertion at the same time. If this be done in the near future she is fine with that plan. If up front mastectomy felt to be unlikely to remove her tumor completely, then we will proceed with neoadjuvant chemotherapy.  2. Followup: Will be determined based on the next step of treatment.  Zola Button, MD 8/12/20151:53 PM

## 2014-05-27 NOTE — Progress Notes (Signed)
Pt's son Yuette Putnam asking for FMLA forms to be filled out. Forms given to Carmelina Noun in [facility]

## 2014-05-28 ENCOUNTER — Encounter: Payer: Self-pay | Admitting: Oncology

## 2014-05-28 NOTE — Progress Notes (Signed)
Put son's fmla form on nurse's desk. °

## 2014-05-29 ENCOUNTER — Encounter: Payer: Self-pay | Admitting: Oncology

## 2014-05-29 ENCOUNTER — Other Ambulatory Visit: Payer: Self-pay | Admitting: Licensed Clinical Social Worker

## 2014-05-29 DIAGNOSIS — B20 Human immunodeficiency virus [HIV] disease: Secondary | ICD-10-CM

## 2014-05-29 MED ORDER — RITONAVIR 100 MG PO TABS: 100.0000 mg | ORAL_TABLET | Freq: Every day | ORAL | Status: DC

## 2014-05-29 MED ORDER — DARUNAVIR ETHANOLATE 800 MG PO TABS: 800.0000 mg | ORAL_TABLET | Freq: Every day | ORAL | Status: DC

## 2014-05-29 NOTE — Progress Notes (Signed)
Faxed son's fmla form to [employer] @ [phone]

## 2014-05-31 DIAGNOSIS — E785 Hyperlipidemia, unspecified: Secondary | ICD-10-CM | POA: Insufficient documentation

## 2014-05-31 MED ORDER — FLUVASTATIN SODIUM 40 MG PO CAPS: 40.0000 mg | ORAL_CAPSULE | Freq: Two times a day (BID) | ORAL | Status: DC

## 2014-06-08 ENCOUNTER — Other Ambulatory Visit (INDEPENDENT_AMBULATORY_CARE_PROVIDER_SITE_OTHER): Payer: Self-pay | Admitting: Surgery

## 2014-06-10 ENCOUNTER — Encounter (HOSPITAL_COMMUNITY): Payer: Self-pay | Admitting: Pharmacy Technician

## 2014-06-11 ENCOUNTER — Ambulatory Visit: Payer: No Typology Code available for payment source

## 2014-06-12 ENCOUNTER — Encounter (HOSPITAL_COMMUNITY)
Admission: RE | Admit: 2014-06-12 | Discharge: 2014-06-12 | Disposition: A | Discharge status: Home / Self Care | Payer: No Typology Code available for payment source | Source: Ambulatory Visit | Attending: Surgery | Admitting: Surgery

## 2014-06-12 ENCOUNTER — Encounter (HOSPITAL_COMMUNITY): Payer: Self-pay

## 2014-06-12 ENCOUNTER — Other Ambulatory Visit (HOSPITAL_COMMUNITY): Payer: No Typology Code available for payment source

## 2014-06-12 DIAGNOSIS — Z9221 Personal history of antineoplastic chemotherapy: Secondary | ICD-10-CM | POA: Diagnosis not present

## 2014-06-12 DIAGNOSIS — Z21 Asymptomatic human immunodeficiency virus [HIV] infection status: Secondary | ICD-10-CM | POA: Diagnosis not present

## 2014-06-12 DIAGNOSIS — Z85038 Personal history of other malignant neoplasm of large intestine: Secondary | ICD-10-CM | POA: Diagnosis not present

## 2014-06-12 DIAGNOSIS — C50919 Malignant neoplasm of unspecified site of unspecified female breast: Secondary | ICD-10-CM | POA: Diagnosis present

## 2014-06-12 DIAGNOSIS — Z79899 Other long term (current) drug therapy: Secondary | ICD-10-CM | POA: Diagnosis not present

## 2014-06-12 DIAGNOSIS — Z9049 Acquired absence of other specified parts of digestive tract: Secondary | ICD-10-CM | POA: Diagnosis not present

## 2014-06-12 DIAGNOSIS — Z9089 Acquired absence of other organs: Secondary | ICD-10-CM | POA: Diagnosis not present

## 2014-06-12 DIAGNOSIS — Z90710 Acquired absence of both cervix and uterus: Secondary | ICD-10-CM | POA: Diagnosis not present

## 2014-06-12 DIAGNOSIS — M8708 Idiopathic aseptic necrosis of bone, other site: Secondary | ICD-10-CM | POA: Diagnosis not present

## 2014-06-12 DIAGNOSIS — C773 Secondary and unspecified malignant neoplasm of axilla and upper limb lymph nodes: Secondary | ICD-10-CM | POA: Diagnosis not present

## 2014-06-12 DIAGNOSIS — I10 Essential (primary) hypertension: Secondary | ICD-10-CM | POA: Diagnosis not present

## 2014-06-12 HISTORY — DX: Adverse effect of unspecified anesthetic, initial encounter: T41.45XA

## 2014-06-12 HISTORY — DX: Essential (primary) hypertension: I10

## 2014-06-12 HISTORY — DX: Anxiety disorder, unspecified: F41.9

## 2014-06-12 HISTORY — DX: Other complications of anesthesia, initial encounter: T88.59XA

## 2014-06-12 LAB — BASIC METABOLIC PANEL
ANION GAP: 15 (ref 5–15)
BUN: 13 mg/dL (ref 6–23)
CALCIUM: 9.5 mg/dL (ref 8.4–10.5)
CO2: 23 mEq/L (ref 19–32)
Chloride: 102 mEq/L (ref 96–112)
Creatinine, Ser: 0.88 mg/dL (ref 0.50–1.10)
GFR calc Af Amer: 77 mL/min — ABNORMAL LOW (ref >90)
GFR, EST NON AFRICAN AMERICAN: 66 mL/min — AB (ref >90)
Glucose, Bld: 67 mg/dL — ABNORMAL LOW (ref 70–99)
Potassium: 3.9 mEq/L (ref 3.7–5.3)
Sodium: 140 mEq/L (ref 137–147)

## 2014-06-12 LAB — CBC
HCT: 40.3 % (ref 36.0–46.0)
Hemoglobin: 13.2 g/dL (ref 12.0–15.0)
MCH: 30.6 pg (ref 26.0–34.0)
MCHC: 32.8 g/dL (ref 30.0–36.0)
MCV: 93.5 fL (ref 78.0–100.0)
PLATELETS: 220 10*3/uL (ref 150–400)
RBC: 4.31 MIL/uL (ref 3.87–5.11)
RDW: 13.8 % (ref 11.5–15.5)
WBC: 5.1 10*3/uL (ref 4.0–10.5)

## 2014-06-12 NOTE — Pre-Procedure Instructions (Signed)
Kelly Chavez  06/12/2014   Your procedure is scheduled on:  06/15/14  Report to [facility] Admitting at 930 AM.  Call this number if you have problems the morning of surgery: [phone]   Remember:   Do not eat food or drink liquids after midnight.   Take these medicines the morning of surgery with A SIP OF WATER: atenolol,prezista,truvada,norvir   Do not wear jewelry, make-up or nail polish.  Do not wear lotions, powders, or perfumes. You may wear deodorant.  Do not shave 48 hours prior to surgery. Men may shave face and neck.  Do not bring valuables to the hospital.  [facility] is not responsible                  for any belongings or valuables.               Contacts, dentures or bridgework may not be worn into surgery.  Leave suitcase in the car. After surgery it may be brought to your room.  For patients admitted to the hospital, discharge time is determined by your                treatment team.               Patients discharged the day of surgery will not be allowed to drive  home.  Name and phone number of your driver:   Special Instructions: Shower using CHG 2 nights before surgery and the night before surgery.  If you shower the day of surgery use CHG.  Use special wash - you have one bottle of CHG for all showers.  You should use approximately 1/3 of the bottle for each shower.   Please read over the following fact sheets that you were given: Pain Booklet, Coughing and Deep Breathing and Surgical Site Infection Prevention

## 2014-06-14 MED ORDER — CHLORHEXIDINE GLUCONATE 4 % EX LIQD
1.0000 "application " | Freq: Once | CUTANEOUS | Status: DC
  Filled 2014-06-14: qty 15

## 2014-06-14 MED ORDER — CEFAZOLIN SODIUM-DEXTROSE 2-3 GM-% IV SOLR
2.0000 g | INTRAVENOUS | Status: AC
  Administered 2014-06-15: 2 g via INTRAVENOUS
  Filled 2014-06-14: qty 50

## 2014-06-15 ENCOUNTER — Observation Stay (HOSPITAL_COMMUNITY): Payer: No Typology Code available for payment source

## 2014-06-15 ENCOUNTER — Encounter (HOSPITAL_COMMUNITY): Payer: Self-pay | Admitting: *Deleted

## 2014-06-15 ENCOUNTER — Ambulatory Visit (HOSPITAL_COMMUNITY): Payer: No Typology Code available for payment source | Admitting: Anesthesiology

## 2014-06-15 ENCOUNTER — Encounter (HOSPITAL_COMMUNITY)
Admission: RE | Disposition: A | Discharge status: Home / Self Care | Payer: Self-pay | Source: Ambulatory Visit | Attending: Surgery

## 2014-06-15 ENCOUNTER — Ambulatory Visit (HOSPITAL_COMMUNITY): Payer: No Typology Code available for payment source

## 2014-06-15 ENCOUNTER — Observation Stay (HOSPITAL_COMMUNITY)
Admission: RE | Admit: 2014-06-15 | Discharge: 2014-06-16 | Disposition: A | Discharge status: Home / Self Care | Payer: No Typology Code available for payment source | Source: Ambulatory Visit | Attending: Surgery | Admitting: Surgery

## 2014-06-15 ENCOUNTER — Encounter (HOSPITAL_COMMUNITY): Payer: No Typology Code available for payment source | Admitting: Anesthesiology

## 2014-06-15 DIAGNOSIS — C50911 Malignant neoplasm of unspecified site of right female breast: Secondary | ICD-10-CM

## 2014-06-15 DIAGNOSIS — C50919 Malignant neoplasm of unspecified site of unspecified female breast: Secondary | ICD-10-CM

## 2014-06-15 DIAGNOSIS — I10 Essential (primary) hypertension: Secondary | ICD-10-CM | POA: Insufficient documentation

## 2014-06-15 DIAGNOSIS — Z9221 Personal history of antineoplastic chemotherapy: Secondary | ICD-10-CM | POA: Insufficient documentation

## 2014-06-15 DIAGNOSIS — Z9089 Acquired absence of other organs: Secondary | ICD-10-CM | POA: Insufficient documentation

## 2014-06-15 DIAGNOSIS — Z9049 Acquired absence of other specified parts of digestive tract: Secondary | ICD-10-CM | POA: Insufficient documentation

## 2014-06-15 DIAGNOSIS — C773 Secondary and unspecified malignant neoplasm of axilla and upper limb lymph nodes: Secondary | ICD-10-CM | POA: Insufficient documentation

## 2014-06-15 DIAGNOSIS — Z90710 Acquired absence of both cervix and uterus: Secondary | ICD-10-CM | POA: Insufficient documentation

## 2014-06-15 DIAGNOSIS — Z85038 Personal history of other malignant neoplasm of large intestine: Secondary | ICD-10-CM | POA: Insufficient documentation

## 2014-06-15 DIAGNOSIS — M8708 Idiopathic aseptic necrosis of bone, other site: Secondary | ICD-10-CM | POA: Insufficient documentation

## 2014-06-15 DIAGNOSIS — Z21 Asymptomatic human immunodeficiency virus [HIV] infection status: Secondary | ICD-10-CM | POA: Insufficient documentation

## 2014-06-15 DIAGNOSIS — Z79899 Other long term (current) drug therapy: Secondary | ICD-10-CM | POA: Insufficient documentation

## 2014-06-15 HISTORY — PX: MASTECTOMY MODIFIED RADICAL: SHX5962

## 2014-06-15 HISTORY — PX: PORTACATH PLACEMENT: SHX2246

## 2014-06-15 SURGERY — MASTECTOMY, MODIFIED RADICAL
Anesthesia: General | Site: Chest | Laterality: Right

## 2014-06-15 MED ORDER — ONDANSETRON HCL 4 MG/2ML IJ SOLN
INTRAMUSCULAR | Status: AC
  Filled 2014-06-15: qty 2

## 2014-06-15 MED ORDER — GLYCOPYRROLATE 0.2 MG/ML IJ SOLN
INTRAMUSCULAR | Status: AC
  Filled 2014-06-15: qty 2

## 2014-06-15 MED ORDER — ARTIFICIAL TEARS OP OINT
TOPICAL_OINTMENT | OPHTHALMIC | Status: DC | PRN
  Administered 2014-06-15: 1 via OPHTHALMIC

## 2014-06-15 MED ORDER — PROPOFOL 10 MG/ML IV BOLUS
INTRAVENOUS | Status: AC
  Filled 2014-06-15: qty 20

## 2014-06-15 MED ORDER — ROCURONIUM BROMIDE 100 MG/10ML IV SOLN
INTRAVENOUS | Status: DC | PRN
  Administered 2014-06-15: 50 mg via INTRAVENOUS

## 2014-06-15 MED ORDER — DARUNAVIR ETHANOLATE 800 MG PO TABS
800.0000 mg | ORAL_TABLET | Freq: Every day | ORAL | Status: DC
  Administered 2014-06-16: 800 mg via ORAL
  Filled 2014-06-15 (×3): qty 1

## 2014-06-15 MED ORDER — SODIUM CHLORIDE 0.9 % IR SOLN
Status: DC | PRN
  Administered 2014-06-15: 13:00:00

## 2014-06-15 MED ORDER — EMTRICITABINE-TENOFOVIR DF 200-300 MG PO TABS
1.0000 | ORAL_TABLET | Freq: Every day | ORAL | Status: DC
  Filled 2014-06-15: qty 1

## 2014-06-15 MED ORDER — GLYCOPYRROLATE 0.2 MG/ML IJ SOLN
INTRAMUSCULAR | Status: DC | PRN
  Administered 2014-06-15: .5 mg via INTRAVENOUS

## 2014-06-15 MED ORDER — HEPARIN SOD (PORK) LOCK FLUSH 100 UNIT/ML IV SOLN
INTRAVENOUS | Status: DC | PRN
  Administered 2014-06-15: 500 [IU] via INTRAVENOUS

## 2014-06-15 MED ORDER — HYDROMORPHONE HCL PF 1 MG/ML IJ SOLN
0.2500 mg | INTRAMUSCULAR | Status: DC | PRN
  Administered 2014-06-15 (×3): 0.25 mg via INTRAVENOUS

## 2014-06-15 MED ORDER — LACTATED RINGERS IV SOLN
INTRAVENOUS | Status: DC | PRN
  Administered 2014-06-15 (×2): via INTRAVENOUS

## 2014-06-15 MED ORDER — LIDOCAINE HCL (CARDIAC) 20 MG/ML IV SOLN
INTRAVENOUS | Status: AC
  Filled 2014-06-15: qty 5

## 2014-06-15 MED ORDER — ARTIFICIAL TEARS OP OINT
TOPICAL_OINTMENT | OPHTHALMIC | Status: AC
  Filled 2014-06-15: qty 3.5

## 2014-06-15 MED ORDER — PROPOFOL 10 MG/ML IV BOLUS
INTRAVENOUS | Status: DC | PRN
  Administered 2014-06-15: 50 mg via INTRAVENOUS
  Administered 2014-06-15: 150 mg via INTRAVENOUS

## 2014-06-15 MED ORDER — HEPARIN SODIUM (PORCINE) 5000 UNIT/ML IJ SOLN
5000.0000 [IU] | Freq: Three times a day (TID) | INTRAMUSCULAR | Status: DC
  Administered 2014-06-15 – 2014-06-16 (×2): 5000 [IU] via SUBCUTANEOUS
  Filled 2014-06-15 (×5): qty 1

## 2014-06-15 MED ORDER — KCL IN DEXTROSE-NACL 20-5-0.45 MEQ/L-%-% IV SOLN
INTRAVENOUS | Status: DC
  Administered 2014-06-15 – 2014-06-16 (×2): via INTRAVENOUS
  Filled 2014-06-15 (×3): qty 1000

## 2014-06-15 MED ORDER — NEOSTIGMINE METHYLSULFATE 10 MG/10ML IV SOLN
INTRAVENOUS | Status: AC
  Filled 2014-06-15: qty 1

## 2014-06-15 MED ORDER — MORPHINE SULFATE 2 MG/ML IJ SOLN
1.0000 mg | INTRAMUSCULAR | Status: DC | PRN
  Administered 2014-06-15: 2 mg via INTRAVENOUS
  Filled 2014-06-15: qty 1

## 2014-06-15 MED ORDER — MIDAZOLAM HCL 2 MG/2ML IJ SOLN
INTRAMUSCULAR | Status: AC
  Filled 2014-06-15: qty 2

## 2014-06-15 MED ORDER — ROCURONIUM BROMIDE 50 MG/5ML IV SOLN
INTRAVENOUS | Status: AC
  Filled 2014-06-15: qty 1

## 2014-06-15 MED ORDER — OXYCODONE HCL 5 MG PO TABS: 5.0000 mg | ORAL_TABLET | Freq: Once | ORAL | Status: DC | PRN

## 2014-06-15 MED ORDER — HYDROCHLOROTHIAZIDE 12.5 MG PO CAPS
12.5000 mg | ORAL_CAPSULE | Freq: Every day | ORAL | Status: DC
  Administered 2014-06-16: 12.5 mg via ORAL
  Filled 2014-06-15 (×2): qty 1

## 2014-06-15 MED ORDER — MIDAZOLAM HCL 5 MG/5ML IJ SOLN
INTRAMUSCULAR | Status: DC | PRN
  Administered 2014-06-15 (×2): 1 mg via INTRAVENOUS

## 2014-06-15 MED ORDER — HYDROCODONE-ACETAMINOPHEN 5-325 MG PO TABS
1.0000 | ORAL_TABLET | ORAL | Status: DC | PRN
  Filled 2014-06-15: qty 2

## 2014-06-15 MED ORDER — ATENOLOL 50 MG PO TABS
50.0000 mg | ORAL_TABLET | Freq: Every day | ORAL | Status: DC
  Administered 2014-06-16: 50 mg via ORAL
  Filled 2014-06-15 (×2): qty 1

## 2014-06-15 MED ORDER — ONDANSETRON HCL 4 MG/2ML IJ SOLN
INTRAMUSCULAR | Status: DC | PRN
  Administered 2014-06-15: 4 mg via INTRAVENOUS

## 2014-06-15 MED ORDER — FENTANYL CITRATE 0.05 MG/ML IJ SOLN
INTRAMUSCULAR | Status: AC
  Filled 2014-06-15: qty 5

## 2014-06-15 MED ORDER — OXYCODONE HCL 5 MG/5ML PO SOLN: 5.0000 mg | Freq: Once | ORAL | Status: DC | PRN

## 2014-06-15 MED ORDER — HYDROMORPHONE HCL PF 1 MG/ML IJ SOLN
INTRAMUSCULAR | Status: AC
Start: 2014-06-15 — End: 2014-06-16
  Filled 2014-06-15: qty 1

## 2014-06-15 MED ORDER — NEOSTIGMINE METHYLSULFATE 10 MG/10ML IV SOLN
INTRAVENOUS | Status: DC | PRN
Start: 2014-06-15 — End: 2014-06-16
  Administered 2014-06-15: 3 mg via INTRAVENOUS

## 2014-06-15 MED ORDER — FENTANYL CITRATE 0.05 MG/ML IJ SOLN
INTRAMUSCULAR | Status: DC | PRN
  Administered 2014-06-15 (×5): 50 ug via INTRAVENOUS

## 2014-06-15 MED ORDER — ONDANSETRON HCL 4 MG PO TABS: 4.0000 mg | ORAL_TABLET | Freq: Four times a day (QID) | ORAL | Status: DC | PRN

## 2014-06-15 MED ORDER — RITONAVIR 100 MG PO TABS
100.0000 mg | ORAL_TABLET | Freq: Every day | ORAL | Status: DC
  Administered 2014-06-16: 100 mg via ORAL
  Filled 2014-06-15 (×3): qty 1

## 2014-06-15 MED ORDER — ONDANSETRON HCL 4 MG/2ML IJ SOLN
4.0000 mg | Freq: Four times a day (QID) | INTRAMUSCULAR | Status: DC | PRN
  Administered 2014-06-15 – 2014-06-16 (×2): 4 mg via INTRAVENOUS
  Filled 2014-06-15 (×2): qty 2

## 2014-06-15 MED ORDER — ONDANSETRON HCL 4 MG/2ML IJ SOLN
4.0000 mg | Freq: Four times a day (QID) | INTRAMUSCULAR | Status: AC | PRN
  Administered 2014-06-15: 4 mg via INTRAVENOUS

## 2014-06-15 MED ORDER — LACTATED RINGERS IV SOLN
INTRAVENOUS | Status: DC
  Administered 2014-06-15: 10:00:00 via INTRAVENOUS

## 2014-06-15 MED ORDER — LIDOCAINE HCL (CARDIAC) 20 MG/ML IV SOLN
INTRAVENOUS | Status: DC | PRN
  Administered 2014-06-15: 40 mg via INTRAVENOUS
  Administered 2014-06-15: 60 mg via INTRAVENOUS

## 2014-06-15 MED ORDER — 0.9 % SODIUM CHLORIDE (POUR BTL) OPTIME
TOPICAL | Status: DC | PRN
  Administered 2014-06-15 (×4): 1000 mL

## 2014-06-15 SURGICAL SUPPLY — 83 items
APPLIER CLIP 9.375 MED OPEN (MISCELLANEOUS) ×4
ATCH SMKEVC FLXB CAUT HNDSWH (FILTER) ×2 IMPLANT
BAG DECANTER FOR FLEXI CONT (MISCELLANEOUS) ×4 IMPLANT
BANDAGE ELASTIC 6 VELCRO ST LF (GAUZE/BANDAGES/DRESSINGS) IMPLANT
BENZOIN TINCTURE PRP APPL 2/3 (GAUZE/BANDAGES/DRESSINGS) ×4 IMPLANT
BINDER BREAST LRG (GAUZE/BANDAGES/DRESSINGS) ×4 IMPLANT
BINDER BREAST XLRG (GAUZE/BANDAGES/DRESSINGS) IMPLANT
BLADE SURG 11 STRL SS (BLADE) ×4 IMPLANT
BLADE SURG 15 STRL LF DISP TIS (BLADE) ×2 IMPLANT
BLADE SURG 15 STRL SS (BLADE) ×2
CANISTER SUCTION 2500CC (MISCELLANEOUS) ×4 IMPLANT
CHLORAPREP W/TINT 10.5 ML (MISCELLANEOUS) ×4 IMPLANT
CLIP APPLIE 9.375 MED OPEN (MISCELLANEOUS) ×2 IMPLANT
CLOSURE WOUND 1/4X4 (GAUZE/BANDAGES/DRESSINGS) ×1
CONT SPEC 4OZ CLIKSEAL STRL BL (MISCELLANEOUS) IMPLANT
COVER PROBE W GEL 5X96 (DRAPES) IMPLANT
COVER SURGICAL LIGHT HANDLE (MISCELLANEOUS) ×8 IMPLANT
CRADLE DONUT ADULT HEAD (MISCELLANEOUS) IMPLANT
DECANTER SPIKE VIAL GLASS SM (MISCELLANEOUS) IMPLANT
DRAIN CHANNEL 19F RND (DRAIN) ×8 IMPLANT
DRAPE C-ARM 42X72 X-RAY (DRAPES) ×4 IMPLANT
DRAPE CHEST BREAST 15X10 FENES (DRAPES) ×8 IMPLANT
DRAPE LAPAROSCOPIC ABDOMINAL (DRAPES) IMPLANT
DRAPE PROXIMA HALF (DRAPES) IMPLANT
DRAPE UTILITY 15X26 W/TAPE STR (DRAPE) ×16 IMPLANT
DRSG PAD ABDOMINAL 8X10 ST (GAUZE/BANDAGES/DRESSINGS) ×4 IMPLANT
ELECT CAUTERY BLADE 6.4 (BLADE) ×8 IMPLANT
ELECT REM PT RETURN 9FT ADLT (ELECTROSURGICAL) ×4
ELECTRODE REM PT RTRN 9FT ADLT (ELECTROSURGICAL) ×2 IMPLANT
EVACUATOR SILICONE 100CC (DRAIN) ×8 IMPLANT
EVACUATOR SMOKE ACCUVAC VALLEY (FILTER) ×2
GAUZE SPONGE 4X4 12PLY STRL (GAUZE/BANDAGES/DRESSINGS) IMPLANT
GAUZE SPONGE 4X4 16PLY XRAY LF (GAUZE/BANDAGES/DRESSINGS) ×4 IMPLANT
GLOVE BIO SURGEON STRL SZ7 (GLOVE) ×12 IMPLANT
GLOVE BIO SURGEON STRL SZ7.5 (GLOVE) ×8 IMPLANT
GLOVE BIOGEL PI IND STRL 7.0 (GLOVE) ×4 IMPLANT
GLOVE BIOGEL PI IND STRL 7.5 (GLOVE) ×2 IMPLANT
GLOVE BIOGEL PI INDICATOR 7.0 (GLOVE) ×4
GLOVE BIOGEL PI INDICATOR 7.5 (GLOVE) ×2
GLOVE SURG SIGNA 7.5 PF LTX (GLOVE) ×8 IMPLANT
GOWN STRL REUS W/ TWL LRG LVL3 (GOWN DISPOSABLE) ×6 IMPLANT
GOWN STRL REUS W/ TWL XL LVL3 (GOWN DISPOSABLE) ×4 IMPLANT
GOWN STRL REUS W/TWL LRG LVL3 (GOWN DISPOSABLE) ×6
GOWN STRL REUS W/TWL XL LVL3 (GOWN DISPOSABLE) ×4
INTRODUCER 13FR (MISCELLANEOUS) IMPLANT
KIT BASIN OR (CUSTOM PROCEDURE TRAY) ×4 IMPLANT
KIT PORT POWER 8FR ISP CVUE (Catheter) IMPLANT
KIT PORT POWER 9.6FR MRI PREA (Catheter) IMPLANT
KIT PORT POWER ISP 8FR (Catheter) IMPLANT
KIT POWER CATH 8FR (Catheter) ×4 IMPLANT
KIT ROOM TURNOVER OR (KITS) ×4 IMPLANT
NEEDLE 18GX1X1/2 (RX/OR ONLY) (NEEDLE) IMPLANT
NEEDLE HYPO 25GX1X1/2 BEV (NEEDLE) IMPLANT
NS IRRIG 1000ML POUR BTL (IV SOLUTION) ×16 IMPLANT
PACK GENERAL/GYN (CUSTOM PROCEDURE TRAY) ×4 IMPLANT
PACK SURGICAL SETUP 50X90 (CUSTOM PROCEDURE TRAY) ×4 IMPLANT
PAD ARMBOARD 7.5X6 YLW CONV (MISCELLANEOUS) ×8 IMPLANT
PENCIL BUTTON HOLSTER BLD 10FT (ELECTRODE) ×4 IMPLANT
PREFILTER EVAC NS 1 1/3-3/8IN (MISCELLANEOUS) ×4 IMPLANT
SET SHEATH INTRODUCER 10FR (MISCELLANEOUS) IMPLANT
SHEATH COOK PEEL AWAY SET 9F (SHEATH) IMPLANT
SPECIMEN JAR X LARGE (MISCELLANEOUS) ×4 IMPLANT
SPONGE GAUZE 4X4 12PLY STER LF (GAUZE/BANDAGES/DRESSINGS) ×4 IMPLANT
SPONGE LAP 18X18 X RAY DECT (DISPOSABLE) ×4 IMPLANT
STAPLER VISISTAT 35W (STAPLE) IMPLANT
STRIP CLOSURE SKIN 1/4X4 (GAUZE/BANDAGES/DRESSINGS) ×3 IMPLANT
SUT ETHILON 2 0 FS 18 (SUTURE) ×8 IMPLANT
SUT MNCRL AB 4-0 PS2 18 (SUTURE) ×12 IMPLANT
SUT MON AB 5-0 PS2 18 (SUTURE) ×8 IMPLANT
SUT SILK 3 0 (SUTURE) ×2
SUT SILK 3-0 18XBRD TIE 12 (SUTURE) ×2 IMPLANT
SUT VIC AB 3-0 SH 18 (SUTURE) ×16 IMPLANT
SUT VIC AB 3-0 SH 27 (SUTURE) ×2
SUT VIC AB 3-0 SH 27XBRD (SUTURE) ×2 IMPLANT
SYR 20ML ECCENTRIC (SYRINGE) ×8 IMPLANT
SYR 5ML LUER SLIP (SYRINGE) ×4 IMPLANT
SYR BULB 3OZ (MISCELLANEOUS) ×4 IMPLANT
SYR CONTROL 10ML LL (SYRINGE) IMPLANT
TOWEL OR 17X24 6PK STRL BLUE (TOWEL DISPOSABLE) ×4 IMPLANT
TOWEL OR 17X26 10 PK STRL BLUE (TOWEL DISPOSABLE) ×4 IMPLANT
TUBE CONNECTING 12'X1/4 (SUCTIONS) ×1
TUBE CONNECTING 12X1/4 (SUCTIONS) ×3 IMPLANT
WATER STERILE IRR 1000ML POUR (IV SOLUTION) IMPLANT

## 2014-06-15 NOTE — Progress Notes (Signed)
NOS note  She is doing well.  Son is in room.  BP 157/86  Pulse 69  Temp(Src) 98 F (36.7 C) (Oral)  Resp 16  Wt 171 lb (77.565 kg)  SpO2 94%  Dressing intact.  Drainage is minimal so far. Will arrange [place] to assist with drains after going home.  Alphonsa Overall, MD, [facility] Surgery Pager: [phone] Office phone:  [phone]

## 2014-06-15 NOTE — Op Note (Signed)
06/15/2014  2:24 PM  PATIENT:  Kelly Chavez, 68 y.o., female, MRN: [id number]  PREOP DIAGNOSIS:  RIGHT BREAST CANCER  POSTOP DIAGNOSIS:   Right breast cancer with apparent skin involvement  , 1 o'clock position (T4, N1)  [photo at the end of the dictation]  PROCEDURE:   Procedure(s):  RIGHT MODIFIED RADICAL MASTECTOMY, INSERTION PORT-A-CATH LEFT SUBCLAVIAN  SURGEON:   Alphonsa Overall, M.D.  ASSISTANT:   Sharyn Dross, RNFA  ANESTHESIA:   general  Anesthesiologist: Albertha Ghee, MD; Laurie Panda, MD CRNA: Scheryl Darter, CRNA; Sammuel Cooper Mumm, CRNA  General  ASA:  3  EBL:  100  ml  BLOOD ADMINISTERED: none  DRAINS: 2 19 French Blake drains  LOCAL MEDICATIONS USED:   None  SPECIMEN:   Right breast and right axillary contents with suture marking lateral margin  COUNTS CORRECT:  YES  INDICATIONS FOR PROCEDURE:  Kelly Chavez is a 68 y.o. (DOB: [birth year]) AA  female whose primary care physician is [name], MD and comes for right modified radical mastectomy.  She has seen Dr. Dahlia Byes for pre op evaluation.  She delayed her surgery some while making decisions.     The indications and risks of the surgery were explained to the patient.  The risks include, but are not limited to, infection, bleeding, and nerve injury.  OPERATIVE NOTE;  The patient was taken to room # 9 at [facility] where she underwent a general anesthesia  supervised by Anesthesiologist: Albertha Ghee, MD; Laurie Panda, MD CRNA: Scheryl Darter, CRNA; Sammuel Cooper Mumm, CRNA. Her right breast and axilla were prepped with  ChloraPrep and sterilely draped.    A time-out and the surgical check list was reviewed.    I made an elliptical incision including the areola in the right breast.  She had a nodule in skin at 1 o'clock about 3 cm from the areola.  This appears to be a local extension of the cancer that has developed since I saw her in the office.  I included this nodule in my skin flaps with a 1 cm margin of  normal appearing skin.   I developed skin flaps medially to the lateral edge of the sternum, inferiorly to the investing fascia of the rectus abdominus muscle, laterally to the anterior edge of the latissimus dorsi muscle, and superiorly to about 2 finger breaths below the clavicle.  The breast was reflected off the pectoralis muscle from medial to lateral.   A long suture was placed on the lateral aspect of the breast.   I continued my dissection to include the right axillary lymph nodes.  I excised the axillary fat up to the axillary vein.  I took level 1 and level 2 lymph nodes, going behind the tight pectoralis minor.  I felt for level 3 nodes and Rotter's nodes (between pectoralis major and minor), but felt nothing abnormal.  I identified the Long Thoracic nerve of Bell and the thoracodorsal nerve.  The patient was paralyzed, so I could not test these nerves.  But I identified them and spared them during the dissection.   I brought out 2 68 F Blake drains below the inferior flaps.  These were sewn in place with 2-0 Nylons.  I irrigated the wound with 2,000 cc of fluid.   The skin was closed with interrupted 3-0 Vicryl sutures and the skin was closed with a 4-0 Monocryl.  After I finished the power port placement, the wound was painted width Dermabond.  We then broke down the drapes and reprepped and repositioned the patient.  I now proceeded with the power port placement.    The patient was placed in Trendelenburg position.  The left subclavian vein was accessed on the third try with a 16 gauge needle and a guide wire threaded through the needle into the vein.  The position of the wire was checked with fluoroscopy.   I then developed a pocket in the upper inner aspect of the left chest for the port reservoir.  I used the [employer] for venous access.  The reservoir was sewn in place with a 3-0 Vicryl suture.  The reservoir had been flushed with dilute (10 units/cc) heparin.   I then  passed the silastic tubing from the reservoir incision to the subclavian stick site and used the 8 French introducer to pass it into the vein.  The tip of the silastic catheter was position at the junction of the SVC and the right atrium under fluoroscopy.  The silastic catheter was then attached to the port with the bayonet device.     The entire port and tubing were checked with fluoroscopy and then the port was flushed with 4 cc of concentrated heparin (100 units/cc).   The wounds were then closed with 3-0 vicryl subcutaneous sutures and the skin closed with a 5-0 Monocryl suture.  The skin was painted with tincture of benzoin and steri-stripped.   A pressure dressing was placed on the wound and the chest wrapped with a breast binder.  Her needle and sponge count were correct at the end of the case.   She was transferred to the recovery room in good condition.  A CXR will be obtained in the recovery room.  She will stay for overnight observation.  Alphonsa Overall, MD, [facility] Surgery Pager: [phone] Office phone:  [phone]     Right breast the day of surgery.  Note nodule at 1 o'clock.  This is over the mass and was not there when I saw her in the office on 04/15/2014.

## 2014-06-15 NOTE — Anesthesia Preprocedure Evaluation (Signed)
Anesthesia Evaluation  Patient identified by MRN, date of birth, ID band Patient awake    Reviewed: Allergy & Precautions, H&P , NPO status , Patient's Chart, lab work & pertinent test results  Airway Mallampati: II  Neck ROM: full    Dental   Pulmonary          Cardiovascular hypertension,     Neuro/Psych Anxiety    GI/Hepatic S/p sigmoidectomy for CA   Endo/Other    Renal/GU      Musculoskeletal   Abdominal   Peds  Hematology  (+) HIV,   Anesthesia Other Findings   Reproductive/Obstetrics                           Anesthesia Physical Anesthesia Plan  ASA: II  Anesthesia Plan: General   Post-op Pain Management:    Induction: Intravenous  Airway Management Planned: Oral ETT  Additional Equipment:   Intra-op Plan:   Post-operative Plan: Extubation in OR  Informed Consent: I have reviewed the patients History and Physical, chart, labs and discussed the procedure including the risks, benefits and alternatives for the proposed anesthesia with the patient or authorized representative who has indicated his/her understanding and acceptance.     Plan Discussed with: CRNA, Anesthesiologist and Surgeon  Anesthesia Plan Comments:         Anesthesia Quick Evaluation

## 2014-06-15 NOTE — Anesthesia Postprocedure Evaluation (Signed)
  Anesthesia Post-op Note  Patient: Kelly Chavez  Procedure(s) Performed: Procedure(s): RIGHT MODIFIED RADICAL MASTECTOMY (Right) INSERTION PORT-A-CATH LEFT SUBCLAVIAN (N/A)  Patient Location: PACU  Anesthesia Type:General  Level of Consciousness: awake and alert   Airway and Oxygen Therapy: Patient Spontanous Breathing  Post-op Pain: mild  Post-op Assessment: Post-op Vital signs reviewed, Patient's Cardiovascular Status Stable and Respiratory Function Stable  Post-op Vital Signs: Reviewed  Filed Vitals:   06/15/14 1630  BP: 161/81  Pulse: 53  Temp: 36.5 C  Resp: 10    Complications: No apparent anesthesia complications

## 2014-06-15 NOTE — Anesthesia Procedure Notes (Signed)
Procedure Name: Intubation Date/Time: 06/15/2014 12:04 PM Performed by: Scheryl Darter Pre-anesthesia Checklist: Patient identified, Emergency Drugs available, Suction available, Patient being monitored and Timeout performed Patient Re-evaluated:Patient Re-evaluated prior to inductionOxygen Delivery Method: Circle system utilized Preoxygenation: Pre-oxygenation with 100% oxygen Intubation Type: IV induction Ventilation: Mask ventilation without difficulty Laryngoscope Size: Mac and 3 Grade View: Grade I Tube type: Oral Tube size: 7.5 mm Number of attempts: 1 Airway Equipment and Method: Stylet Secured at: 23 cm Tube secured with: Tape Dental Injury: Teeth and Oropharynx as per pre-operative assessment

## 2014-06-15 NOTE — H&P (Signed)
Re: Kelly Chavez  DOB: 1946-01-18  MRN: [id number]   ASSESSMENT AND PLAN:  1. Right breast cancer, 2 o'clock   Biopsy 04/09/2014 [phone]) - right breast invasive ductal ca, right axillary lymph node - carcinoma, Triple negative, Ki67 - 19%  I discussed the options for breast cancer treatment with the patient. I discussed a multidisciplinary approach to the treatment of breast cancer, which includes medical oncology and radiation oncology. I discussed the surgical options of lumpectomy vs. mastectomy. If mastectomy, there is the possibility of reconstruction. I discussed the options of lymph node biopsy. The treatment plan depends on the pathologic staging of the tumor and the patient's personal wishes.   The risks of surgery include, but are not limited to, bleeding, infection, the need for further surgery, and nerve injury.  The patient has been given literature on the treatment of breast cancer.   Plan: 1) Needs MRI - she is undecided about the MRI, but leaning to having it , 2) To get med onc and radiation oncology consult, 3) Possible neoadjuvant chemotx, 4) Discussed power port, 5) We talked about finding a PCP (she has an appt with Dr. Liston Alba), 6) I will see her back (or talk to her on the phone) when this is complete.  There is a little friction between son and mother. And the patient is a little hesitant to get the MRI, but I think it could help in staging the cancer and planning treatment.  [I spoke with Dr. Clydene Laming. He has PET scan ordered. She is going ahead with the MRI. He thinks that she is leaning to a mastectomy, axillary node dissection, and power port placement. DN 04/22/2014]  [MRI of right breast - 04/23/2014 - Large biopsy-proven carcinoma in the upper-inner quadrant/upper central right breast with adjacent skin thickening and skin retraction. Skin involvement by tumor cannot be excluded. A second area of suspicious enhancement in the immediate retroareolar right breast is  suspicious for multifocal carcinoma. It is suspected that the biopsy performed in the retroareolar right breast recently that demonstrated fibrosis reflects some fibrosis intervening between the 2 suspicious masses. The biopsy clip for the biopsied fibrosis is deep and cephalad to the suspicious finding on MRI. Tumor involvement of the right nipple/areolar complex cannot be excluded. Right axillary lymphadenopathy consistent with biopsy proven metastatic carcinoma.  PET scan - 04/30/2014 - right breast and right axilla hypermetabolic, but no mets.  I spoke with the patient, but she is vacillating between surgery first or chemotx first. I tried to explain the benefits of each. She has an appt to see Dr. Clydene Laming on 05/27/2014 and wants to talk to him again. DN 05/14/2014]  [She saw Dr. Wanda Plump. Osker Mason 05/27/2014. For some reason she was waiting for me to call her. Anyway, I spent 20+ minutes on the phone with both Ms. Kaney and her son, Legrand Como, about the surgery and recovery. We will go ahead and schedule right MRM and power port placement. I offered to see her in the office before surgery, but she feels comfortable with the plan. DN 06/08/2014]   She met with Dr. Alen Blew on 05/27/2014 and has agreed to go with up front surgery - right modified radical mastectomy with power port placement.  Her son, niece and sister in law are at the bed side.  2. HIV - stable   Sees Dr. Retia Passe  3. Sigmoid colon cancer - 2011   Stage 2 per patient - I have no records of this encounter. This was  outside of [place], [place]. She is going to try to get records.  4. HTN  5. Avascular necrosis of both hips. The patient and son are unaware of this diagnosis. She says that she has arthritis of the hips.   Chief Complaint   Patient presents with   .  eval breast ca   REFERRING PHYSICIAN: Michel Bickers, MD   HISTORY OF PRESENT ILLNESS:  Kelly Chavez is a 68 y.o. (DOB: May 29, 1946) AA female whose primary care physician is Michel Bickers,  MD and comes to me today for right breast cancer.  She is accompanied with her son, Legrand Como.   The patient moved to [place] from [place] in December of 2014. She is widowed. She moved here to be close to her only son.  She noticed in December 2014, some hurting and lumpiness in her breast. Her last mammogram was about 2 years ago. She has 2 cousins on her father's side who have had breast cancer. She had a hysterectomy and bilateral oophrectomy for benign disease in March of 2013. She has not been on hormonal medicine.   Right mammogram ([facility]) - 04/02/2014 - 1. Two right breast masses with abnormal intervening tissue compatible with multifocal breast carcinoma, as described above.  2. Multiple abnormal appearing right axillary lymph nodes, compatible with metastatic nodes.  Biopsy 04/09/2014 [phone]) - right breast invasive ductal ca, right axillary lymph node - carcinoma   Past Medical History   Diagnosis  Date   .  PCP (pneumocystis carinii pneumonia)  1994   .  Cancer of sigmoid colon  2011     sigmoid colectomy   .  Status post chemotherapy  2012   .  Brain tumor (benign)     Past Surgical History   Procedure  Laterality  Date   .  Tonsillectomy     .  Total abdominal hysterectomy w/ bilateral salpingoophorectomy   2012    Current Outpatient Prescriptions   Medication  Sig  Dispense  Refill   .  amLODipine (NORVASC) 10 MG tablet  Take 1 tablet (10 mg total) by mouth daily. Start with taking 1/2 tab daily x 4 days, then increase for full tab  30 tablet  11   .  atenolol (TENORMIN) 50 MG tablet  Take 50 mg by mouth daily.     .  darunavir (PREZISTA) 600 MG tablet  Take 1 tablet (600 mg total) by mouth 2 (two) times daily with a meal.  60 tablet  6   .  emtricitabine-tenofovir (TRUVADA) 200-300 MG per tablet  Take 1 tablet by mouth daily.  30 tablet  6   .  ritonavir (NORVIR) 100 MG capsule  Take 1 capsule (100 mg total) by mouth 2 (two) times daily with a meal.  60  capsule  6    No current facility-administered medications for this visit.   No Known Allergies   REVIEW OF SYSTEMS:  Skin: No history of rash. No history of abnormal moles.  Infection: History of HIV. Has had this since 1994.  Neurologic: No history of stroke. No history of seizure. No history of headaches.  Cardiac: History of HTN since 2010. No history of seeing a cardiologist.  Pulmonary: Does not smoke cigarettes. No asthma or bronchitis. No OSA/CPAP.  Endocrine: No diabetes. No thyroid disease. History of pituitary tumor, but she cannot tell me much about this.  Gastrointestinal: No history of stomach disease. No history of liver disease. No history of gall bladder disease.  No history of pancreas disease. Colon cancer resected in Oct 2011. Stage 2 according to patient. She had chemotx. A port was put in about half way through the treatment.  Urologic: No history of kidney stones. No history of bladder infections.  Musculoskeletal: Hip pain.  Hematologic: No bleeding disorder. No history of anemia. Not anticoagulated.  Psycho-social: The patient is oriented. The patient has no obvious psychologic or social impairment to understanding our conversation and plan.   SOCIAL and FAMILY HISTORY:  Widowed  She is with her son, Legrand Como.  She is retired from [employer].   PHYSICAL EXAM:  BP 155/67  Pulse 57  Temp(Src) 98.5 F (36.9 C) (Oral)  Resp 18  Wt 171 lb (77.565 kg)  SpO2 100%  Ht _0  (1.651 m)  Wt 179 lb (81.194 kg)  BMI 29.79 kg/m2  General: Older AA F who is alert. She is very hard to talk to and does not volunteer information very easily.  HEENT: Normal. Pupils equal.  Neck: Supple. No mass. No thyroid mass.  Lymph Nodes: No supraclavicular or cervical nodes. I can feel some right axillary adenopathy.  Lungs: Clear to auscultation and symmetric breath sounds.  Heart: RRR. No murmur or rub.  Breasts: Right: Bruised. She has a 4 cm mass at the 12 o'clock position of the  right breast.  Left: I feel no mass or nodule.  Abdomen: Soft. No mass. No tenderness. No hernia. Normal bowel sounds. She has a well healed lower midline scar.  Rectal: Not done.  Extremities: She moves very slowly. Has a walker to help her in walking.  Neurologic: Grossly intact to motor and sensory function.  Psychiatric: Has normal mood and affect. Behavior is normal.   DATA REVIEWED:  Epic notes.  Alphonsa Overall, MD, [facility], [address] [place], [place]  Phone: [phone] FAX: [phone]

## 2014-06-16 ENCOUNTER — Encounter (HOSPITAL_COMMUNITY): Payer: Self-pay | Admitting: Surgery

## 2014-06-16 DIAGNOSIS — C50919 Malignant neoplasm of unspecified site of unspecified female breast: Secondary | ICD-10-CM | POA: Diagnosis not present

## 2014-06-16 MED ORDER — FENTANYL CITRATE 0.05 MG/ML IJ SOLN
INTRAMUSCULAR | Status: AC
  Filled 2014-06-16: qty 5

## 2014-06-16 MED ORDER — HYDROCODONE-ACETAMINOPHEN 5-325 MG PO TABS: 1.0000 | ORAL_TABLET | ORAL | Status: DC | PRN

## 2014-06-16 MED ORDER — MIDAZOLAM HCL 2 MG/2ML IJ SOLN
INTRAMUSCULAR | Status: AC
Start: 2014-06-16 — End: 2014-06-16
  Filled 2014-06-16: qty 2

## 2014-06-16 MED ORDER — PROPOFOL 10 MG/ML IV BOLUS
INTRAVENOUS | Status: AC
  Filled 2014-06-16: qty 20

## 2014-06-16 NOTE — Progress Notes (Addendum)
Pt was assisted by the NT, Anderson Malta, and myself.  She was unable to bend at her knees. Her son stated that this has been going on for a few weeks.  When asked this question to the patient she was unable to answer.  She has moderate strength in her lower extremities.  I could not palpate a pulse in the posterior tibula/fibula area nor in her dorsalis pedis area.  I could palpate a faint pulse in the popliteal area.   Patient became nauseated and threw up.  Zofran was given and patient request to go back to bed.   She had trouble shuffling her feet with 2 assist to get back in bed.    Doppler her dorsalis pedis pulse.  Left greater than right, right was very faint with volume up on doppler.

## 2014-06-16 NOTE — Progress Notes (Signed)
UR Completed.  Vergie Living [phone] 06/16/2014

## 2014-06-16 NOTE — Care Management Note (Signed)
  Page 1 of 1   06/16/2014     9:40:10 AM CARE MANAGEMENT NOTE 06/16/2014  Patient:  MADISAN, BICE   Account Number:  [id number]  Date Initiated:  06/16/2014  Documentation initiated by:  Magdalen Spatz  Subjective/Objective Assessment:     Action/Plan:   Anticipated DC Date:  06/16/2014   Anticipated DC Plan:  [place]         Choice offered to / List presented to:  C-1 Patient        [place] arranged  HH-1 RN      [place].   Status of service:  Completed, signed off Medicare Important Message given?   (If response is "NO", the following Medicare IM given date fields will be blank) Date Medicare IM given:   Medicare IM given by:   Date Additional Medicare IM given:   Additional Medicare IM given by:    Discharge Disposition:    Per UR Regulation:  Reviewed for med. necessity/level of care/duration of stay  If discussed at [place] of Stay Meetings, dates discussed:    Comments:  06-16-14 Patient confirmed face sheet . Magdalen Spatz RN BSN [phone]

## 2014-06-16 NOTE — Transfer of Care (Signed)
Immediate Anesthesia Transfer of Care Note  Patient: Kelly Chavez  Procedure(s) Performed: Procedure(s): RIGHT MODIFIED RADICAL MASTECTOMY (Right) INSERTION PORT-A-CATH LEFT SUBCLAVIAN (N/A)  Patient Location: PACU  Anesthesia Type:General  Level of Consciousness: awake, alert , oriented and sedated  Airway & Oxygen Therapy: Patient Spontanous Breathing and Patient connected to nasal cannula oxygen  Post-op Assessment: Report given to PACU RN, Post -op Vital signs reviewed and stable and Patient moving all extremities  Post vital signs: Reviewed and stable  Complications: No apparent anesthesia complications

## 2014-06-16 NOTE — Progress Notes (Signed)
Kelly Chavez to be D/C'd Home with home health per MD order.  Discussed with the patient and all questions fully answered.  VSS, Skin clean, dry and intact without evidence of skin break down, no evidence of skin tears noted. Incision clean, dry, intact with no sign of infection noted.    IV catheter discontinued intact. Site without signs and symptoms of complications. Dressing and pressure applied.  An After Visit Summary was printed and given to the patient. Patient has prescription x1.  Patient able to empty drains and verbalize process for drain and incision care at home.  Patient was also able to ambulate in hall today and was insistent upon going home.  Son at bedside and verbalizes ability to help out at home when his mother needs it.  D/c education completed with patient/family including follow up instructions, medication list, d/c activities limitations if indicated, with other d/c instructions as indicated by MD - patient able to verbalize understanding, all questions fully answered.   Patient instructed to return to ED, call 911, or call MD for any changes in condition.   Patient escorted via [place], and D/C home via private auto.  [name] 06/16/2014 5:49 PM

## 2014-06-16 NOTE — Discharge Instructions (Signed)
[facility] - DISCHARGE INSTRUCTIONS TO PATIENT  Activity:  Lifting - No lifting more than 15 pounds for 5 days, then no limit  Wound Care:   May shower in 3 or 4 days.  Dry wound thoroughly after showering.         Empty drains a least twice a day - record the amount drained - bring that record to the office with you  Diet:  As tolerated  Follow up appointment:  Call Dr. Pollie Friar office [facility]) at [phone] for an appointment in 1 week  Medications and dosages:  Resume your home medications.  You have a prescription for:  vicodin  Call Dr. Lucia Gaskins or his office  [phone]) if you have:  Temperature greater than 100.4,  Persistent nausea and vomiting,  Severe uncontrolled pain,  Redness, tenderness, or signs of infection (pain, swelling, redness, odor or green/yellow discharge around the site),  Difficulty breathing, headache or visual disturbances,  Any other questions or concerns you may have after discharge.  In an emergency, call 911 or go to an Emergency Department at a nearby hospital.

## 2014-06-16 NOTE — Progress Notes (Addendum)
Talked with patient about concerns of her gait and the fact she is unable to stand without 2 person assist.  Patient stated that she has not fallen in 2 years but that she is very careful when getting up and walking.  At night, she stated that she wears depends and a pad so that she does not have to get up, due to the difficulty in getting out of bed. We have not been able to walk her she can not bend at her knees or move her legs without difficulty.

## 2014-06-16 NOTE — Discharge Summary (Signed)
Physician Discharge Summary  Patient ID:  Kelly Chavez  MRN: [id number]  DOB/AGE: 67-Feb-1947 67 y.o.  Admit date: 06/15/2014 Discharge date: 06/16/2014  Discharge Diagnoses:  1.  Right breast cancer, with apparent skin involvement (T4, N1)  [photo part of op note] - final pathology pending 2. HIV - stable   Sees Dr. Retia Passe  3. Sigmoid colon cancer - 2011   Stage 2 per patient - I have no records of this encounter. This was outside of [place], [place]. She is going to try to get records.  4. HTN  5. Avascular necrosis of both hips. The patient and son are unaware of this diagnosis. She says that she has arthritis of the hips.  She has much more limited mobility than I appreciated in the office.   Active Problems:   Breast cancer   Operation: Procedure(s):  RIGHT MODIFIED RADICAL MASTECTOMY,  INSERTION PORT-A-CATH LEFT SUBCLAVIAN on 06/15/2014 - D. Lucia Gaskins  Discharged Condition: good  Hospital Course: Kelly Chavez is an 68 y.o. female whose primary care physician is [name], MD and who was admitted 06/15/2014 with a chief complaint of advanced right breast cancer.  She has seen Dr. Clydene Laming for evaluation.  She has a triple neg breast cancer and will need chemotherapy.  We discussed the possibility of neoadjuvant chemotx vs post surgical.  The patient was anxious to get rid of the cancer and we thought it best to proceed with surgery first.     She was brought to the operating room on 06/15/2014 and underwent   RIGHT MODIFIED RADICAL MASTECTOMY, INSERTION PORT-A-CATH LEFT SUBCLAVIAN .  She was nauseated the night of surgery and has not been very ambulatory.  She feels much better this AM and is not normally very ambulatory at home. Her son is in the room and she is ready to go home.  The discharge instructions were reviewed with the patient.  Consults: None  Significant Diagnostic Studies: Results for orders placed during the hospital encounter of 51/88/41  BASIC METABOLIC  PANEL      Result Value Ref Range   Sodium 140  137 - 147 mEq/L   Potassium 3.9  3.7 - 5.3 mEq/L   Chloride 102  96 - 112 mEq/L   CO2 23  19 - 32 mEq/L   Glucose, Bld 67 (*) 70 - 99 mg/dL   BUN 13  6 - 23 mg/dL   Creatinine, Ser 0.88  0.50 - 1.10 mg/dL   Calcium 9.5  8.4 - 10.5 mg/dL   GFR calc non Af Amer 66 (*) >90 mL/min   GFR calc Af Amer 77 (*) >90 mL/min   Anion gap 15  5 - 15  CBC      Result Value Ref Range   WBC 5.1  4.0 - 10.5 K/uL   RBC 4.31  3.87 - 5.11 MIL/uL   Hemoglobin 13.2  12.0 - 15.0 g/dL   HCT 40.3  36.0 - 46.0 %   MCV 93.5  78.0 - 100.0 fL   MCH 30.6  26.0 - 34.0 pg   MCHC 32.8  30.0 - 36.0 g/dL   RDW 13.8  11.5 - 15.5 %   Platelets 220  150 - 400 K/uL   Dg Chest Portable 1 View  06/15/2014   CLINICAL DATA:  Port-A-Cath placement.  EXAM: PORTABLE CHEST - 1 VIEW  COMPARISON:  06/12/2014  FINDINGS: Left Port-A-Cath tip:  Right atrium.  Low lung volumes. No visible pneumothorax. Abnormal density in the  right suprahilar region. New clips along the right axilla with drains projecting over the right chest wall.  IMPRESSION: 1. Left Port-A-Cath tip:  Right atrium.  No pneumothorax. 2. Airspace opacity potentially from atelectasis or early pneumonia, right upper lobe in the suprahilar region.   Electronically Signed   By: Sherryl Barters M.D.   On: 06/15/2014 15:55   Discharge Exam:  Filed Vitals:   06/16/14 0510  BP: 152/71  Pulse: 68  Temp: 98.1 F (36.7 C)  Resp: 15   General: Older AA F who is alert.  Lungs: Clear to auscultation and symmetric breath sounds. Heart:  RRR. No murmur or rub. Chest:  Dressing dry.  Drains recorded - #1/2 - 58/145 cc last 24 hours  Discharge Medications:     Medication List         atenolol 50 MG tablet  Commonly known as:  TENORMIN  Take 50 mg by mouth daily.     Darunavir Ethanolate 800 MG tablet  Commonly known as:  PREZISTA  Take 1 tablet (800 mg total) by mouth daily.     emtricitabine-tenofovir 200-300 MG per  tablet  Commonly known as:  TRUVADA  Take 1 tablet by mouth daily.     fluvastatin 40 MG capsule  Commonly known as:  LESCOL  Take 40 mg by mouth 2 (two) times daily.     hydrochlorothiazide 12.5 MG capsule  Commonly known as:  MICROZIDE  Take 1 capsule (12.5 mg total) by mouth daily.     HYDROcodone-acetaminophen 5-325 MG per tablet  Commonly known as:  NORCO/VICODIN  Take 1-2 tablets by mouth every 4 (four) hours as needed for moderate pain.     ritonavir 100 MG Tabs tablet  Commonly known as:  NORVIR  Take 1 tablet (100 mg total) by mouth daily.        Disposition: Final discharge disposition not confirmed      Discharge Instructions   Diet - low sodium heart healthy    Complete by:  As directed      Increase activity slowly    Complete by:  As directed           Activity:  Lifting - No lifting more than 15 pounds for 5 days, then no limit  Wound Care:   May shower in 3 or 4 days.  Dry wound thoroughly after showering.         Empty drains a least twice a day - record the amount drained - bring that record to the office with you  Diet:  As tolerated  Follow up appointment:  Call Dr. Pollie Friar office [facility] Surgery) at [phone] for an appointment in 1 week  Medications and dosages:  Resume your home medications.  You have a prescription for:  vicodin  Signed: Alphonsa Overall, M.D., [facility] Surgery Office:  [phone]  06/16/2014, 7:44 AM

## 2014-06-18 ENCOUNTER — Ambulatory Visit: Payer: No Typology Code available for payment source | Admitting: Internal Medicine

## 2014-06-18 ENCOUNTER — Telehealth: Payer: Self-pay | Admitting: Internal Medicine

## 2014-06-18 NOTE — Telephone Encounter (Signed)
Patient is anticipated to start a regimen of a combination of Adriamycin, Cytoxan and taxol.  This is an aggressive combination and we can adjust it according to her tolerance. This will not start for another 4 to 6 weeks from her surgery per her oncologist.  We will see her back in 2 wk from her surgery to see how she is doing and evaluate if we need to change her ART regimen

## 2014-06-25 ENCOUNTER — Telehealth (INDEPENDENT_AMBULATORY_CARE_PROVIDER_SITE_OTHER): Payer: Self-pay

## 2014-06-25 NOTE — Telephone Encounter (Signed)
Museum/gallery conservator / ADV states patient is depressed needs order for Social worker to motivate patient . She is not wanting to go outside except going to MD. Patient also has a new onset of bells palsy that could be causing the depression  . Order given  for social work per Dr. Lucia Gaskins

## 2014-07-01 ENCOUNTER — Telehealth: Payer: Self-pay | Admitting: Oncology

## 2014-07-01 ENCOUNTER — Encounter: Payer: Self-pay | Admitting: Oncology

## 2014-07-01 ENCOUNTER — Ambulatory Visit (HOSPITAL_BASED_OUTPATIENT_CLINIC_OR_DEPARTMENT_OTHER): Payer: No Typology Code available for payment source | Admitting: Oncology

## 2014-07-01 VITALS — BP 122/63 | HR 62 | Temp 98.4°F | Resp 18 | Ht 65.0 in | Wt 182.1 lb

## 2014-07-01 DIAGNOSIS — Z171 Estrogen receptor negative status [ER-]: Secondary | ICD-10-CM

## 2014-07-01 DIAGNOSIS — C50919 Malignant neoplasm of unspecified site of unspecified female breast: Secondary | ICD-10-CM

## 2014-07-01 DIAGNOSIS — C773 Secondary and unspecified malignant neoplasm of axilla and upper limb lymph nodes: Secondary | ICD-10-CM

## 2014-07-01 DIAGNOSIS — C50219 Malignant neoplasm of upper-inner quadrant of unspecified female breast: Secondary | ICD-10-CM

## 2014-07-01 NOTE — Telephone Encounter (Signed)
gv adn printed appt sched and avs for pt for OCT °

## 2014-07-01 NOTE — Progress Notes (Signed)
Hematology and Oncology Follow Up Visit  Kelly Chavez [id number] 1946/07/11 68 y.o. 07/01/2014 1:56 PM [name], MDSnider, [name], MD   Principle Diagnosis: 68 year old woman with stage IIB (T2 N1) right-sided breast cancer. 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   Prior Therapy:  She is status post biopsy on 04/09/2014 which confirmed the presence of invasive carcinoma with axillary lymph node involvement. She is status post right modified radical mastectomy and insertion of a Port-A-Cath done on 06/15/2014. Her final pathology revealed T4bN1a. Her tumor showed lymphovascular invasion with involvement of the nipple but not the nipple and dermal lymphatics. One out of 16 lymph nodes involved.  Current therapy: Under evaluation for systemic therapy.  Interim History:  Ms. Kelly Chavez presents today for a followup visit. Since her last visit, she underwent modified radical mastectomy and a Port-A-Cath insertion. She tolerated the procedure well and is recovering quite nicely at this time. She still have drains in place but the amount of fluid is decreasing slowly. She is reporting some soreness around the incision site and on her arm. She is still ambulatory without any decline. She does not report any constitutional symptoms of fevers or chills or sweats. Has not reported any weight loss or appetite changes. She has not reported any headaches or blurry vision or syncope. She has not reported any seizure activity. Not reporting any chest pain shortness of breath cough or hemoptysis. She she has not reported any nausea or vomiting or abdominal pain. Has not reported any frequency urgency or hesitancy. Has not reported any skeletal complaints. Has not reported any arthralgias or myalgias. Rest of the review of systems unremarkable.    Medications: I have reviewed the patient's current medications.  Current  Outpatient Prescriptions  Medication Sig Dispense Refill  . amLODipine (NORVASC) 10 MG tablet Take 10 mg by mouth daily.      Marland Kitchen atenolol (TENORMIN) 50 MG tablet Take 50 mg by mouth daily.      . Darunavir Ethanolate (PREZISTA) 800 MG tablet Take 1 tablet (800 mg total) by mouth daily.  90 tablet  3  . emtricitabine-tenofovir (TRUVADA) 200-300 MG per tablet Take 1 tablet by mouth daily.  90 tablet  3  . fluvastatin (LESCOL) 40 MG capsule Take 40 mg by mouth 2 (two) times daily.      . hydrochlorothiazide (MICROZIDE) 12.5 MG capsule Take 1 capsule (12.5 mg total) by mouth daily.  30 capsule  3  . HYDROcodone-acetaminophen (NORCO/VICODIN) 5-325 MG per tablet Take 1-2 tablets by mouth every 4 (four) hours as needed for moderate pain.  30 tablet  0  . ritonavir (NORVIR) 100 MG TABS tablet Take 1 tablet (100 mg total) by mouth daily.  90 tablet  3   No current facility-administered medications for this visit.     Allergies: No Known Allergies  Past Medical History, Surgical history, Social history, and Family History were reviewed and updated.  Physical Exam: Blood pressure 122/63, pulse 62, temperature 98.4 F (36.9 C), temperature source Oral, resp. rate 18, height 5' 5"  (1.651 m), weight 182 lb 1.6 oz (82.6 kg), SpO2 99.00%. ECOG: 1 General appearance: alert and cooperative Head: Normocephalic, without obvious abnormality Neck: no adenopathy Lymph nodes: Cervical, supraclavicular, and axillary nodes normal. Heart:regular rate and rhythm, S1, S2 normal, no murmur, click, rub or gallop Physical examination: Well-healed scar noted with drains in place. No erythema or induration. Lung:Heart exam -  S1, S2 normal, no murmur, no gallop, rate regular Abdomin: soft, non-tender, without masses or organomegaly EXT:no erythema, induration, or nodules Skin showed no rashes or lesions.  Lab Results: Lab Results  Component Value Date   WBC 5.1 06/12/2014   HGB 13.2 06/12/2014   HCT 40.3 06/12/2014    MCV 93.5 06/12/2014   PLT 220 06/12/2014     Chemistry      Component Value Date/Time   NA 140 06/12/2014 1507   K 3.9 06/12/2014 1507   CL 102 06/12/2014 1507   CO2 23 06/12/2014 1507   BUN 13 06/12/2014 1507   CREATININE 0.88 06/12/2014 1507   CREATININE 0.82 01/22/2014 1503      Component Value Date/Time   CALCIUM 9.5 06/12/2014 1507   ALKPHOS 143* 01/22/2014 1503   AST 27 01/22/2014 1503   ALT 24 01/22/2014 1503   BILITOT 0.3 01/22/2014 1503       Impression and Plan:  68 year old woman with the following issues:  1. Recent diagnosis of invasive ductal carcinoma of the right breast presented with a 4 cm mass as well as palpableadenopathy. She is status post mastectomy with her tumor found to be T4b.N1a disease with nipple involvement. She did have a grade III out of III. her case was discussed and the breast cancer multidisciplinary conference today and clearly she will require adjuvant chemotherapy. Have had multiple discussions with her regarding this possibility and she is willing to proceed in the near future.  Options of adjuvant systemic chemotherapy were discussed. The regimen of Adriamycin and Cytoxan delivered in dose dense fashion followed by single agent taxanes is the preferred regimen. Alternatively combination of Cytoxan and taxanes would be a regimen to avoid cardiac toxicity associated with Adriamycin. Risks and benefits of success rate of both regimens were discussed. Complications of a close nausea, vomiting, myelosuppression, neutropenia, neutropenic sepsis as well as cardiovascular complications were discussed. After discussing both regimens she is leaning towards a non-Adriamycin-based regimen which I think would be reasonable at this time. I plan to start her  mid-October which would be most to 6 weeks from her surgery.  2. IV access: Port-A-Cath inserted and will be utilized for systemic chemotherapy.  3. Adjuvant radiation therapy: She understands that she will require  adjuvant radiation therapy upon completion of systemic chemotherapy.  4. Followup: Will be in a few weeks to assess her recovery and plan to start systemic therapy as mentioned in mid October.  [facility], MD 9/16/20151:56 PM

## 2014-07-07 ENCOUNTER — Ambulatory Visit (INDEPENDENT_AMBULATORY_CARE_PROVIDER_SITE_OTHER): Payer: No Typology Code available for payment source | Admitting: Internal Medicine

## 2014-07-07 ENCOUNTER — Encounter: Payer: Self-pay | Admitting: Internal Medicine

## 2014-07-07 ENCOUNTER — Ambulatory Visit: Payer: No Typology Code available for payment source

## 2014-07-07 VITALS — Temp 97.7°F | Wt 181.0 lb

## 2014-07-07 DIAGNOSIS — Z23 Encounter for immunization: Secondary | ICD-10-CM

## 2014-07-07 NOTE — Progress Notes (Signed)
Subjective:    Patient ID: Kelly Chavez, female    DOB: [birth year], 68 y.o.   MRN: [id number]  HPI 68 yo F with HIV disease and recently diagnosed with invasive ductal carcinoma of the right breast  status post mastectomy with her tumor found to be T4b.N1a disease with nipple involvement with recommendations to have systemic plus adjuvant chemotherapy. Oncology is planning a regimen of  combination of Cytoxan and taxanes with plans to start mid-October which would be most to 6 weeks from her surgery.  The patient says she is still healing from her mastectomy. She still has 2 surgical drains in place and brings with her the log of drainage. Serous in both drains. No fever, chills, redness or pain. She states that she wants to discuss having chemo until after the holidays  No Known Allergies Current Outpatient Prescriptions on File Prior to Visit  Medication Sig Dispense Refill  . amLODipine (NORVASC) 10 MG tablet Take 10 mg by mouth daily.      Marland Kitchen atenolol (TENORMIN) 50 MG tablet Take 50 mg by mouth daily.      . Darunavir Ethanolate (PREZISTA) 800 MG tablet Take 1 tablet (800 mg total) by mouth daily.  90 tablet  3  . emtricitabine-tenofovir (TRUVADA) 200-300 MG per tablet Take 1 tablet by mouth daily.  90 tablet  3  . fluvastatin (LESCOL) 40 MG capsule Take 40 mg by mouth 2 (two) times daily.      . hydrochlorothiazide (MICROZIDE) 12.5 MG capsule Take 1 capsule (12.5 mg total) by mouth daily.  30 capsule  3  . HYDROcodone-acetaminophen (NORCO/VICODIN) 5-325 MG per tablet Take 1-2 tablets by mouth every 4 (four) hours as needed for moderate pain.  30 tablet  0  . ritonavir (NORVIR) 100 MG TABS tablet Take 1 tablet (100 mg total) by mouth daily.  90 tablet  3   No current facility-administered medications on file prior to visit.   Active Ambulatory Problems    Diagnosis Date Noted  . HIV disease 01/07/2014  . Ovarian cyst, bilateral 01/26/2014  . Essential hypertension, benign 01/26/2014    . Unspecified vitamin D deficiency 01/26/2014  . DJD (degenerative joint disease) 01/26/2014  . History of pituitary tumor 01/28/2014  . Avascular necrosis of bones of both hips 01/28/2014  . Genital herpes 01/28/2014  . Breast cancer of upper-inner quadrant of right female breast 04/15/2014  . Pedal edema 05/11/2014  . Paranoia 05/11/2014  . Hyperlipidemia 05/31/2014  . Breast cancer 06/15/2014   Resolved Ambulatory Problems    Diagnosis Date Noted  . No Resolved Ambulatory Problems   Past Medical History  Diagnosis Date  . PCP (pneumocystis carinii pneumonia) 1994  . Cancer of sigmoid colon 2011  . Status post chemotherapy 2012  . Brain tumor (benign)   . 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 HIV (human immunodeficiency virus infection)   . Hypertension   . Anxiety   . Complication of anesthesia       Review of Systems     Objective:   Physical Exam Temp(Src) 97.7 F (36.5 C) (Oral)  Wt 181 lb (82.101 kg) Physical Exam  Constitutional:  oriented to person, place, and time. appears well-developed and well-nourished. No distress.  HENT:  Mouth/Throat: Oropharynx is clear and moist. No oropharyngeal exudate.  Cardiovascular: Normal rate, regular rhythm and normal heart sounds. Exam reveals no gallop and no friction rub.  No murmur heard.  Pulmonary/Chest: Effort normal and breath sounds normal. No  respiratory distress.  has no wheezes.  Chest wall = surgical incision from mastectomy is well healed. 2 JP drains in place. Serous drainage in both bulbs. Abdominal: Soft. Bowel sounds are normal.  exhibits no distension. There is no tenderness.  Lymphadenopathy: no cervical adenopathy.  Psychiatric: a normal mood and affect. behavior is normal.       Assessment & Plan:   hiv = currently on truvada/DRVr. We will need to change her to tivicay/truvada once she starts chemo. She is unsure if she is going to start in mid oct. Will check cd 4 coutn and viral  load next month  Breast  ca = chemo to start mid oct tentatively. Will need to check in with oncology to ensure we change her regimen when chemo starts  Health maintennace =flu vaccination today

## 2014-07-29 ENCOUNTER — Telehealth: Payer: Self-pay | Admitting: Oncology

## 2014-07-29 ENCOUNTER — Encounter: Payer: Self-pay | Admitting: *Deleted

## 2014-07-29 ENCOUNTER — Ambulatory Visit (HOSPITAL_BASED_OUTPATIENT_CLINIC_OR_DEPARTMENT_OTHER): Payer: No Typology Code available for payment source | Admitting: Oncology

## 2014-07-29 VITALS — BP 147/57 | HR 63 | Temp 98.4°F | Resp 18 | Ht 65.0 in | Wt 181.5 lb

## 2014-07-29 DIAGNOSIS — F418 Other specified anxiety disorders: Secondary | ICD-10-CM

## 2014-07-29 DIAGNOSIS — C50211 Malignant neoplasm of upper-inner quadrant of right female breast: Secondary | ICD-10-CM

## 2014-07-29 NOTE — Progress Notes (Signed)
Hematology and Oncology Follow Up Visit  Kelly Chavez [id number] [birth year] 68 y.o. 07/29/2014 3:56 PM [name], MDSnider, [name], MD   Principle Diagnosis: 68 year old woman with stage IIB (T2 N1) right-sided breast cancer. 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   Prior Therapy:  She is status post biopsy on 04/09/2014 which confirmed the presence of invasive carcinoma with axillary lymph node involvement. She is status post right modified radical mastectomy and insertion of a Port-A-Cath done on 06/15/2014. Her final pathology revealed T4bN1a. Her tumor showed lymphovascular invasion with involvement of the nipple but not the nipple and dermal lymphatics. One out of 16 lymph nodes involved.  Current therapy: Under evaluation for systemic therapy.  Interim History:  Ms. Kelly Chavez presents today for a followup visit. Since her last visit, she is physically feeling a lot better. Drains have been removed from her surgery site. She is reporting some soreness around the incision site and on her arm. She is still ambulatory without any decline. She does not report any constitutional symptoms of fevers or chills or sweats. Has not reported any weight loss or appetite changes. She has not reported any headaches or blurry vision or syncope. She has not reported any seizure activity. Not reporting any chest pain shortness of breath cough or hemoptysis. She she has not reported any nausea or vomiting or abdominal pain. Has not reported any frequency urgency or hesitancy. Has not reported any skeletal complaints. Has not reported any arthralgias or myalgias. She is reporting however a lot of anxiety and depression related to her diagnosis and she is mentally not ready to proceed with any further therapy. Rest of the review of systems unremarkable.    Medications: I have reviewed the patient's current  medications.  Current Outpatient Prescriptions  Medication Sig Dispense Refill  . amLODipine (NORVASC) 10 MG tablet Take 10 mg by mouth daily.      Marland Kitchen atenolol (TENORMIN) 50 MG tablet Take 50 mg by mouth daily.      . Darunavir Ethanolate (PREZISTA) 800 MG tablet Take 1 tablet (800 mg total) by mouth daily.  90 tablet  3  . emtricitabine-tenofovir (TRUVADA) 200-300 MG per tablet Take 1 tablet by mouth daily.  90 tablet  3  . fluvastatin (LESCOL) 40 MG capsule Take 40 mg by mouth 2 (two) times daily.      . hydrochlorothiazide (MICROZIDE) 12.5 MG capsule Take 1 capsule (12.5 mg total) by mouth daily.  30 capsule  3  . HYDROcodone-acetaminophen (NORCO/VICODIN) 5-325 MG per tablet Take 1-2 tablets by mouth every 4 (four) hours as needed for moderate pain.  30 tablet  0  . ritonavir (NORVIR) 100 MG TABS tablet Take 1 tablet (100 mg total) by mouth daily.  90 tablet  3   No current facility-administered medications for this visit.     Allergies: No Known Allergies  Past Medical History, Surgical history, Social history, and Family History were reviewed and updated.  Physical Exam: Blood pressure 147/57, pulse 63, temperature 98.4 F (36.9 C), temperature source Oral, resp. rate 18, height 5' 5"  (1.651 m), weight 181 lb 8 oz (82.328 kg), SpO2 99.00%. ECOG: 1 General appearance: alert and cooperative Head: Normocephalic, without obvious abnormality Neck: no adenopathy Lymph nodes: Cervical, supraclavicular, and axillary nodes normal. Heart:regular rate and rhythm, S1, S2 normal, no murmur, click, rub or gallop Chest wall examination: Well-healed scar noted. No erythema or induration.  Lung:Heart exam - S1, S2 normal, no murmur, no gallop, rate regular Abdomin: soft, non-tender, without masses or organomegaly EXT:no erythema, induration, or nodules Skin showed no rashes or lesions.  Lab Results: Lab Results  Component Value Date   WBC 5.1 06/12/2014   HGB 13.2 06/12/2014   HCT 40.3  06/12/2014   MCV 93.5 06/12/2014   PLT 220 06/12/2014     Chemistry      Component Value Date/Time   NA 140 06/12/2014 1507   K 3.9 06/12/2014 1507   CL 102 06/12/2014 1507   CO2 23 06/12/2014 1507   BUN 13 06/12/2014 1507   CREATININE 0.88 06/12/2014 1507   CREATININE 0.82 01/22/2014 1503      Component Value Date/Time   CALCIUM 9.5 06/12/2014 1507   ALKPHOS 143* 01/22/2014 1503   AST 27 01/22/2014 1503   ALT 24 01/22/2014 1503   BILITOT 0.3 01/22/2014 1503       Impression and Plan:  68 year old woman with the following issues:  1. Recent diagnosis of invasive ductal carcinoma of the right breast presented with a 4 cm mass as well as palpableadenopathy. She is status post mastectomy with her tumor found to be T4b.N1a disease with nipple involvement. The role adjuvant chemotherapy as well as risks and benefits were discussed again today. I feel that she is physically ready but certainly she is not mentally ready. She would like to defer chemotherapy for the time being. I urged her to take adequate time to think about the next step but she understands also there is a window of opportunity for adjuvant chemotherapy. I feel that she would benefit from chemotherapy regardless when his administered given her high risk cancer. She will like to defer that for next month and we will continue to follow her closely.  2. IV access: Port-A-Cath inserted and will be utilized for systemic chemotherapy.  3. Adjuvant radiation therapy: She understands that she will require adjuvant radiation therapy upon completion of systemic chemotherapy.  4. Anxiety/depression: She feel that she needs support and counseling. She is projecting to take  antidepressant medication but willing to consider therapy. I asked our social worker to evaluate the patient today and provide her with any resources to assist in her condition.  Zola Button, MD 10/14/20153:56 PM

## 2014-07-29 NOTE — Progress Notes (Signed)
Patient expressed thoughts of depression, lack of hope and very 'down' deameanor. Loren Racer, SW, with patient after MD visit.

## 2014-07-29 NOTE — Progress Notes (Signed)
Kelly Chavez  Clinical Social Chavez was referred by nurse for assessment of psychosocial needs due to possible depression.  Clinical Social Worker met with  patient at [facility] to offer support and assess for needs after her appointment. Pt reports she moved here about a year or so ago to be closer to her son. She is from the [place] area and is really missing her family currently. She is teary today as she describes her sister recently having a stroke and her brother passed away recently as well. She denies a h/o depression, but shared that she gets very anxious at times. She reports to be "feeling down and sad". Denies thoughts of harming herself or others. She feels that people may be "going in her house" when she is not home. On chart review, pt appears to have a h/o paranoia and has seen a counselor in the past for this issue. Pt did not share this with CSW today and did not share anymore on this issue. We focused mostly on support systems, emotions related to breast cancer and family concerns. CSW reviewed Breast Cancer Support Group, Pt and Family Support Team and additional supports of CSW team. Pt felt better after talking today and plans to attend Breast Cancer Support Group next week.   Clinical Social Chavez interventions: Emotional support Resource education Referral to Breast Cancer Support Group  Loren Racer, LCSW Clinical Social Worker [place]  [place] Phone: [phone] Fax: [phone]

## 2014-07-29 NOTE — Telephone Encounter (Signed)
Pt confirmed ov per 10/14 POF, gave pt AVS.... KJ

## 2014-08-04 ENCOUNTER — Ambulatory Visit: Payer: No Typology Code available for payment source

## 2014-08-17 ENCOUNTER — Encounter: Payer: Self-pay | Admitting: Internal Medicine

## 2014-09-01 ENCOUNTER — Ambulatory Visit: Payer: No Typology Code available for payment source

## 2014-09-09 ENCOUNTER — Ambulatory Visit: Payer: No Typology Code available for payment source | Admitting: Oncology

## 2014-09-14 ENCOUNTER — Ambulatory Visit: Payer: No Typology Code available for payment source

## 2014-10-02 ENCOUNTER — Other Ambulatory Visit: Payer: Self-pay

## 2014-10-02 ENCOUNTER — Telehealth: Payer: Self-pay | Admitting: Oncology

## 2014-10-02 MED ORDER — LIDOCAINE-PRILOCAINE 2.5-2.5 % EX CREA: 1.0000 "application " | TOPICAL_CREAM | CUTANEOUS | Status: DC | PRN

## 2014-10-02 NOTE — Telephone Encounter (Signed)
Confirm appt d.t for 10/06/14.

## 2014-10-02 NOTE — Telephone Encounter (Signed)
Pt called with questions regarding flush to be done on 12/22. Questions answered, informed pt a prescription for EMLA cream will be sent to her pharmacy for her to pick up and apply 2 hrs prior to appt. Pt also stated she could possibly be late for flush appt on Tuesday d/t appt with PCP at 1045. Informed pt to try and be here for appt with Dr. Alen Blew at 57 and we could see her for flush appt when she is done with physician. Informed pt if she does not believe she will make it by 12 to call and reschedule time. Pt verbalized all understanding and denies any further questions or concerns at this time. Emla cream was ordered per Awilda Metro, PA and sent to preferred pharmacy.

## 2014-10-03 ENCOUNTER — Telehealth: Payer: Self-pay | Admitting: Oncology

## 2014-10-03 NOTE — Telephone Encounter (Signed)
returned pt call and lvm for pt advising that i could not move flush earlier due to her having another appt at another facility

## 2014-10-06 ENCOUNTER — Ambulatory Visit (HOSPITAL_BASED_OUTPATIENT_CLINIC_OR_DEPARTMENT_OTHER): Payer: No Typology Code available for payment source

## 2014-10-06 ENCOUNTER — Ambulatory Visit: Payer: No Typology Code available for payment source | Admitting: Internal Medicine

## 2014-10-06 ENCOUNTER — Telehealth: Payer: Self-pay | Admitting: Oncology

## 2014-10-06 ENCOUNTER — Ambulatory Visit (HOSPITAL_BASED_OUTPATIENT_CLINIC_OR_DEPARTMENT_OTHER): Payer: No Typology Code available for payment source | Admitting: Oncology

## 2014-10-06 VITALS — BP 168/87 | HR 75 | Temp 98.1°F | Resp 16 | Wt 184.2 lb

## 2014-10-06 DIAGNOSIS — Z171 Estrogen receptor negative status [ER-]: Secondary | ICD-10-CM

## 2014-10-06 DIAGNOSIS — F418 Other specified anxiety disorders: Secondary | ICD-10-CM

## 2014-10-06 DIAGNOSIS — C50211 Malignant neoplasm of upper-inner quadrant of right female breast: Secondary | ICD-10-CM

## 2014-10-06 DIAGNOSIS — Z95828 Presence of other vascular implants and grafts: Secondary | ICD-10-CM

## 2014-10-06 DIAGNOSIS — C773 Secondary and unspecified malignant neoplasm of axilla and upper limb lymph nodes: Secondary | ICD-10-CM

## 2014-10-06 MED ORDER — HEPARIN SOD (PORK) LOCK FLUSH 100 UNIT/ML IV SOLN
500.0000 [IU] | Freq: Once | INTRAVENOUS | Status: AC
  Administered 2014-10-06: 500 [IU] via INTRAVENOUS
  Filled 2014-10-06: qty 5

## 2014-10-06 MED ORDER — SODIUM CHLORIDE 0.9 % IJ SOLN
10.0000 mL | INTRAMUSCULAR | Status: DC | PRN
  Administered 2014-10-06: 10 mL via INTRAVENOUS
  Filled 2014-10-06: qty 10

## 2014-10-06 NOTE — Telephone Encounter (Signed)
gv and printed appt sched and avs for pt for Feb 2016 °

## 2014-10-06 NOTE — Patient Instructions (Signed)

## 2014-10-06 NOTE — Telephone Encounter (Signed)
Labs/flush/ov per 12/22 POF, will mail out sch if pt doesn't go through scheduling.... KJ

## 2014-10-06 NOTE — Progress Notes (Signed)
Hematology and Oncology Follow Up Visit  Kelly Chavez [id number] 1946-01-01 68 y.o. 10/06/2014 12:14 PM Kelly Chavez, Kelly Chavez, Kelly Griffins, MD   Principle Diagnosis: 68 year old woman with stage IIB (T2 N1) right-sided breast cancer. 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   Prior Therapy:  She is status post biopsy on 04/09/2014 which confirmed the presence of invasive carcinoma with axillary lymph node involvement. She is status post right modified radical mastectomy and insertion of a Port-A-Cath done on 06/15/2014. Her final pathology revealed T4bN1a. Her tumor showed lymphovascular invasion with involvement of the nipple but not the nipple and dermal lymphatics. One out of 16 lymph nodes involved.  Current therapy: Under evaluation for systemic therapy.  Interim History:  Kelly Chavez presents today for a followup visit. Since her last visit, she is doing better. She is reporting some soreness around the incision site and on her arm. Slight lymphedema noted in her right arm. She is still ambulatory using two canes without any decline. She does not report any constitutional symptoms of fevers or chills or sweats. Has not reported any weight loss or appetite changes. She has not reported any headaches or blurry vision or syncope. She has not reported any seizure activity. Not reporting any chest pain shortness of breath cough or hemoptysis. She she has not reported any nausea or vomiting or abdominal pain. Has not reported any frequency urgency or hesitancy. Has not reported any skeletal complaints. Has not reported any arthralgias or myalgias. She is reporting however a lot of anxiety and depression related to her diagnosis and she is mentally not ready to proceed with any further therapy. Rest of the review of systems unremarkable.    Medications: I have reviewed the patient's current medications.   Current Outpatient Prescriptions  Medication Sig Dispense Refill  . amLODipine (NORVASC) 10 MG tablet Take 10 mg by mouth daily.    Marland Kitchen atenolol (TENORMIN) 50 MG tablet Take 50 mg by mouth daily.    . Darunavir Ethanolate (PREZISTA) 800 MG tablet Take 1 tablet (800 mg total) by mouth daily. 90 tablet 3  . emtricitabine-tenofovir (TRUVADA) 200-300 MG per tablet Take 1 tablet by mouth daily. 90 tablet 3  . fluvastatin (LESCOL) 40 MG capsule Take 40 mg by mouth 2 (two) times daily.    . hydrochlorothiazide (MICROZIDE) 12.5 MG capsule Take 1 capsule (12.5 mg total) by mouth daily. 30 capsule 3  . HYDROcodone-acetaminophen (NORCO/VICODIN) 5-325 MG per tablet Take 1-2 tablets by mouth every 4 (four) hours as needed for moderate pain. 30 tablet 0  . lidocaine-prilocaine (EMLA) cream Apply 1 application topically as needed. 30 g 0  . ritonavir (NORVIR) 100 MG TABS tablet Take 1 tablet (100 mg total) by mouth daily. 90 tablet 3   No current facility-administered medications for this visit.     Allergies: No Known Allergies  Past Medical History, Surgical history, Social history, and Family History were reviewed and updated.  Physical Exam: There were no vitals taken for this visit. ECOG: 1 General appearance: alert and cooperative. Mood is good. NAD.  Head: Normocephalic, without obvious abnormality Neck: no adenopathy Lymph nodes: Cervical, supraclavicular, and axillary nodes normal. Heart:regular rate and rhythm, S1, S2 normal, no murmur, click, rub or gallop Chest wall examination: Well-healed scar noted. No erythema or induration. Lung:Heart exam - S1, S2 normal, no murmur, no gallop, rate regular Abdomin: soft, non-tender, without masses or organomegaly EXT:no  erythema, induration, or nodules. Increase right arm edema.  Skin showed no rashes or lesions.  Lab Results: Lab Results  Component Value Date   WBC 5.1 06/12/2014   HGB 13.2 06/12/2014   HCT 40.3 06/12/2014   MCV 93.5  06/12/2014   PLT 220 06/12/2014     Chemistry      Component Value Date/Time   NA 140 06/12/2014 1507   K 3.9 06/12/2014 1507   CL 102 06/12/2014 1507   CO2 23 06/12/2014 1507   BUN 13 06/12/2014 1507   CREATININE 0.88 06/12/2014 1507   CREATININE 0.82 01/22/2014 1503      Component Value Date/Time   CALCIUM 9.5 06/12/2014 1507   ALKPHOS 143* 01/22/2014 1503   AST 27 01/22/2014 1503   ALT 24 01/22/2014 1503   BILITOT 0.3 01/22/2014 1503       Impression and Plan:  68 year old woman with the following issues:  1. Recent diagnosis of invasive ductal carcinoma of the right breast presented with a 4 cm mass as well as palpable adenopathy. She is status post mastectomy with her tumor found to be T4b.N1a disease with nipple involvement.   The role adjuvant chemotherapy as well as risks and benefits were discussed again today. She is continues to be mentally not ready for that. She she is actually unsure whether she wants to receive chemotherapy at all. Today she is open to the idea lobe more I will like to continue to think about it. He understands she is at a high risk of developing recurrent and stage IV disease. For this reason, I'll like to obtain a PET CT scan before her next follow-up to determine whether she has relapse or not. Depending on the results of the PET scan, we will make decisions regarding her chemotherapy. In the meantime she will continue to consider her options.  2. IV access: Port-A-Cath inserted and will be utilized for systemic chemotherapy. This will be flushed every 6 weeks.  3. Adjuvant radiation therapy: She understands that she will require adjuvant radiation therapy upon completion of systemic chemotherapy. She is not really sure she was to proceed with this either but I will continue to address that with her.  4. Anxiety/depression: She continues to receive consoling for that. She is better today.   5. Follow-up: Will be in 4-6 weeks after a PET  scan.  Zola Button, MD 12/22/201512:14 PM

## 2014-10-20 ENCOUNTER — Ambulatory Visit (INDEPENDENT_AMBULATORY_CARE_PROVIDER_SITE_OTHER): Payer: No Typology Code available for payment source | Admitting: Internal Medicine

## 2014-10-20 VITALS — BP 168/81 | HR 56 | Temp 98.8°F | Wt 189.0 lb

## 2014-10-20 DIAGNOSIS — R441 Visual hallucinations: Secondary | ICD-10-CM

## 2014-10-20 DIAGNOSIS — Z21 Asymptomatic human immunodeficiency virus [HIV] infection status: Secondary | ICD-10-CM

## 2014-10-20 DIAGNOSIS — F22 Delusional disorders: Secondary | ICD-10-CM

## 2014-10-20 LAB — COMPLETE METABOLIC PANEL WITH GFR
ALT: 21 U/L (ref 0–35)
AST: 24 U/L (ref 0–37)
Albumin: 3.7 g/dL (ref 3.5–5.2)
Alkaline Phosphatase: 114 U/L (ref 39–117)
BILIRUBIN TOTAL: 0.3 mg/dL (ref 0.2–1.2)
BUN: 20 mg/dL (ref 6–23)
CALCIUM: 9.1 mg/dL (ref 8.4–10.5)
CHLORIDE: 104 meq/L (ref 96–112)
CO2: 24 meq/L (ref 19–32)
CREATININE: 0.96 mg/dL (ref 0.50–1.10)
GFR, EST NON AFRICAN AMERICAN: 61 mL/min
GFR, Est African American: 71 mL/min
GLUCOSE: 67 mg/dL — AB (ref 70–99)
Potassium: 4.2 mEq/L (ref 3.5–5.3)
Sodium: 138 mEq/L (ref 135–145)
TOTAL PROTEIN: 7.3 g/dL (ref 6.0–8.3)

## 2014-10-20 NOTE — Progress Notes (Signed)
Patient ID: Kelly Chavez, female   DOB: 25-Nov-1945, 69 y.o.   MRN: [id number]       Patient ID: Kelly Chavez, female   DOB: [birth year], 69 y.o.   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 Her tumor showed lymphovascular invasion with involvement of the nipple but not the nipple and dermal lymphatics. One out of 16 lymph nodes involved. She has been meeting with Dr. Alen Blew discussing treatment options. Thus far, she is not mentally ready to proceed with chemotherapy or adjunctive radiation therapy. She has PET scan scheduled this month to monitor progress of cancer.  In regards to hiv disease, she is on truvada/DRVr, which is well controlled and continues to take daily  Still feels suspicious that someone is watching her at home. (unclear if this is hallucination in our discussion)  Outpatient Encounter Prescriptions as of 10/20/2014  Medication Sig  . amLODipine (NORVASC) 10 MG tablet Take 10 mg by mouth daily.  Marland Kitchen atenolol (TENORMIN) 50 MG tablet Take 50 mg by mouth daily.  . Darunavir Ethanolate (PREZISTA) 800 MG tablet Take 1 tablet (800 mg total) by mouth daily.  Marland Kitchen emtricitabine-tenofovir (TRUVADA) 200-300 MG per tablet Take 1 tablet by mouth daily.  . fluvastatin (LESCOL) 40 MG capsule Take 40 mg by mouth 2 (two) times daily.  . hydrochlorothiazide (MICROZIDE) 12.5 MG capsule Take 1 capsule (12.5 mg total) by mouth daily.  Marland Kitchen HYDROcodone-acetaminophen (NORCO/VICODIN) 5-325 MG per tablet Take 1-2 tablets by mouth every 4 (four) hours as needed for moderate pain.  Marland Kitchen lidocaine-prilocaine (EMLA) cream Apply 1  application topically as needed.  . ritonavir (NORVIR) 100 MG TABS tablet Take 1 tablet (100 mg total) by mouth daily.     Patient Active Problem List   Diagnosis Date Noted  . Hyperlipidemia 05/31/2014  . Pedal edema 05/11/2014  . Paranoia 05/11/2014  . Breast cancer of upper-inner quadrant of right female breast 04/15/2014  . History of pituitary tumor 01/28/2014  . Avascular necrosis of bones of both hips 01/28/2014  . Genital herpes 01/28/2014  . Ovarian cyst, bilateral 01/26/2014  . Essential hypertension, benign 01/26/2014  . Unspecified vitamin D deficiency 01/26/2014  . DJD (degenerative joint disease) 01/26/2014  . HIV disease 01/07/2014     Health Maintenance Due  Topic Date Due  . TETANUS/TDAP  10/21/1965  . COLONOSCOPY  10/21/1996  . ZOSTAVAX  10/21/2006  . DEXA SCAN  10/22/2011     Review of Systems  Physical Exam   BP 168/81 mmHg  Pulse 56  Temp(Src) 98.8 F (37.1 C) (Oral)  Wt 189 lb (85.73 kg) Physical Exam  Constitutional:  oriented to person, place, and time. appears well-developed and well-nourished. No distress.  HENT:  Mouth/Throat: Oropharynx is clear and moist. No oropharyngeal exudate.  Cardiovascular: Normal rate, regular rhythm and normal heart sounds. Exam reveals no gallop and no friction rub.  No murmur heard.  Chest wall = port in place Pulmonary/Chest: Effort normal and breath sounds normal. No respiratory distress.  has no wheezes.  Abdominal: Soft. Bowel sounds are normal.  exhibits no distension. There is no tenderness.  Lymphadenopathy: no cervical adenopathy. and right arm  asymmetry with lymphaedema Neurological: alert and oriented to person, place, and time.  Skin: Skin is warm and dry. No rash noted. No erythema.  Psychiatric: a normal mood and affect. behavior is normal.   Lab Results  Component Value Date   CD4TCELL 24* 01/22/2014   Lab Results  Component Value Date   CD4TABS 640 01/22/2014   Lab Results  Component  Value Date   HIV1RNAQUANT <20 01/22/2014   Lab Results  Component Value Date   HEPBSAB NEG 01/22/2014   No results found for: RPR  CBC Lab Results  Component Value Date   WBC 5.1 06/12/2014   RBC 4.31 06/12/2014   HGB 13.2 06/12/2014   HCT 40.3 06/12/2014   PLT 220 06/12/2014   MCV 93.5 06/12/2014   MCH 30.6 06/12/2014   MCHC 32.8 06/12/2014   RDW 13.8 06/12/2014   LYMPHSABS 2.6 01/22/2014   MONOABS 0.4 01/22/2014   EOSABS 0.1 01/22/2014   BASOSABS 0.0 01/22/2014   BMET Lab Results  Component Value Date   NA 140 06/12/2014   K 3.9 06/12/2014   CL 102 06/12/2014   CO2 23 06/12/2014   GLUCOSE 67* 06/12/2014   BUN 13 06/12/2014   CREATININE 0.88 06/12/2014   CALCIUM 9.5 06/12/2014   GFRNONAA 66* 06/12/2014   GFRAA 77* 06/12/2014     Assessment and Plan   hiv = will get lab work today, continue on current regimen. 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 Alen Blew  Paranoia = will refer her to [facility] for assessment and treatment. Also seeing Grayland Ormond, in house counselor.

## 2014-10-21 LAB — CBC WITH DIFFERENTIAL/PLATELET
Basophils Absolute: 0 10*3/uL (ref 0.0–0.1)
Basophils Relative: 0 % (ref 0–1)
EOS ABS: 0.1 10*3/uL (ref 0.0–0.7)
Eosinophils Relative: 1 % (ref 0–5)
HCT: 37.6 % (ref 36.0–46.0)
HEMOGLOBIN: 12.2 g/dL (ref 12.0–15.0)
LYMPHS PCT: 32 % (ref 12–46)
Lymphs Abs: 2.3 10*3/uL (ref 0.7–4.0)
MCH: 29.1 pg (ref 26.0–34.0)
MCHC: 32.4 g/dL (ref 30.0–36.0)
MCV: 89.7 fL (ref 78.0–100.0)
MPV: 11.7 fL (ref 8.6–12.4)
Monocytes Absolute: 0.4 10*3/uL (ref 0.1–1.0)
Monocytes Relative: 6 % (ref 3–12)
Neutro Abs: 4.3 10*3/uL (ref 1.7–7.7)
Neutrophils Relative %: 61 % (ref 43–77)
Platelets: 251 10*3/uL (ref 150–400)
RBC: 4.19 MIL/uL (ref 3.87–5.11)
RDW: 15.1 % (ref 11.5–15.5)
WBC: 7.1 10*3/uL (ref 4.0–10.5)

## 2014-10-22 LAB — HIV-1 RNA QUANT-NO REFLEX-BLD
HIV 1 RNA Quant: <20 copies/mL (ref <20)
HIV-1 RNA Quant, Log: <1.3 {Log} (ref <1.30)

## 2014-10-22 LAB — T-HELPER CELL (CD4) - (RCID CLINIC ONLY)
CD4 T CELL HELPER: 24 % — AB (ref 33–55)
CD4 T Cell Abs: 560 /uL (ref 400–2700)

## 2014-11-17 ENCOUNTER — Ambulatory Visit (HOSPITAL_COMMUNITY)
Admission: RE | Admit: 2014-11-17 | Discharge: 2014-11-17 | Disposition: A | Discharge status: Home / Self Care | Payer: No Typology Code available for payment source | Source: Ambulatory Visit | Attending: Oncology | Admitting: Oncology

## 2014-11-17 ENCOUNTER — Other Ambulatory Visit (HOSPITAL_BASED_OUTPATIENT_CLINIC_OR_DEPARTMENT_OTHER): Payer: No Typology Code available for payment source

## 2014-11-17 ENCOUNTER — Ambulatory Visit (HOSPITAL_BASED_OUTPATIENT_CLINIC_OR_DEPARTMENT_OTHER): Payer: No Typology Code available for payment source

## 2014-11-17 VITALS — BP 156/76 | HR 76 | Temp 98.0°F

## 2014-11-17 DIAGNOSIS — C50211 Malignant neoplasm of upper-inner quadrant of right female breast: Secondary | ICD-10-CM

## 2014-11-17 DIAGNOSIS — Z452 Encounter for adjustment and management of vascular access device: Secondary | ICD-10-CM

## 2014-11-17 DIAGNOSIS — Z95828 Presence of other vascular implants and grafts: Secondary | ICD-10-CM

## 2014-11-17 LAB — COMPREHENSIVE METABOLIC PANEL (CC13)
ALT: 23 U/L (ref 0–55)
AST: 26 U/L (ref 5–34)
Albumin: 3.5 g/dL (ref 3.5–5.0)
Alkaline Phosphatase: 127 U/L (ref 40–150)
Anion Gap: 10 mEq/L (ref 3–11)
BUN: 19.1 mg/dL (ref 7.0–26.0)
CALCIUM: 8.5 mg/dL (ref 8.4–10.4)
CHLORIDE: 110 meq/L — AB (ref 98–109)
CO2: 23 meq/L (ref 22–29)
CREATININE: 0.9 mg/dL (ref 0.6–1.1)
EGFR: 76 mL/min/{1.73_m2} — AB (ref >90)
GLUCOSE: 100 mg/dL (ref 70–140)
Potassium: 4.2 mEq/L (ref 3.5–5.1)
Sodium: 143 mEq/L (ref 136–145)
Total Bilirubin: <0.2 mg/dL (ref 0.20–1.20)
Total Protein: 7.8 g/dL (ref 6.4–8.3)

## 2014-11-17 LAB — CBC WITH DIFFERENTIAL/PLATELET
BASO%: 0.2 % (ref 0.0–2.0)
BASOS ABS: 0 10*3/uL (ref 0.0–0.1)
EOS ABS: 0.1 10*3/uL (ref 0.0–0.5)
EOS%: 1.2 % (ref 0.0–7.0)
HCT: 38.3 % (ref 34.8–46.6)
HGB: 11.5 g/dL — ABNORMAL LOW (ref 11.6–15.9)
LYMPH%: 32.3 % (ref 14.0–49.7)
MCH: 28.7 pg (ref 25.1–34.0)
MCHC: 30 g/dL — AB (ref 31.5–36.0)
MCV: 95.5 fL (ref 79.5–101.0)
MONO#: 0.5 10*3/uL (ref 0.1–0.9)
MONO%: 7.4 % (ref 0.0–14.0)
NEUT#: 3.6 10*3/uL (ref 1.5–6.5)
NEUT%: 58.9 % (ref 38.4–76.8)
PLATELETS: 212 10*3/uL (ref 145–400)
RBC: 4.01 10*6/uL (ref 3.70–5.45)
RDW: 14.9 % — ABNORMAL HIGH (ref 11.2–14.5)
WBC: 6.1 10*3/uL (ref 3.9–10.3)
lymph#: 2 10*3/uL (ref 0.9–3.3)

## 2014-11-17 LAB — GLUCOSE, CAPILLARY: GLUCOSE-CAPILLARY: 81 mg/dL (ref 70–99)

## 2014-11-17 MED ORDER — SODIUM CHLORIDE 0.9 % IJ SOLN
10.0000 mL | INTRAMUSCULAR | Status: DC | PRN
  Administered 2014-11-17: 10 mL via INTRAVENOUS
  Filled 2014-11-17: qty 10

## 2014-11-17 MED ORDER — FLUDEOXYGLUCOSE F - 18 (FDG) INJECTION
10.3000 | Freq: Once | INTRAVENOUS | Status: AC | PRN
  Administered 2014-11-17: 10.3 via INTRAVENOUS

## 2014-11-17 MED ORDER — HEPARIN SOD (PORK) LOCK FLUSH 100 UNIT/ML IV SOLN
500.0000 [IU] | Freq: Once | INTRAVENOUS | Status: AC
  Administered 2014-11-17: 500 [IU] via INTRAVENOUS
  Filled 2014-11-17: qty 5

## 2014-11-17 NOTE — Patient Instructions (Signed)

## 2014-11-19 ENCOUNTER — Ambulatory Visit (HOSPITAL_BASED_OUTPATIENT_CLINIC_OR_DEPARTMENT_OTHER): Payer: No Typology Code available for payment source | Admitting: Oncology

## 2014-11-19 ENCOUNTER — Telehealth: Payer: Self-pay | Admitting: Oncology

## 2014-11-19 VITALS — BP 163/72 | HR 62 | Temp 97.5°F | Resp 18 | Ht 65.0 in | Wt 193.3 lb

## 2014-11-19 DIAGNOSIS — F418 Other specified anxiety disorders: Secondary | ICD-10-CM

## 2014-11-19 DIAGNOSIS — C50211 Malignant neoplasm of upper-inner quadrant of right female breast: Secondary | ICD-10-CM

## 2014-11-19 NOTE — Telephone Encounter (Signed)
Pt confirmed labs/ovflush was added after MD visit per 02/04 POF, gave pt AVS... KJ

## 2014-11-19 NOTE — Progress Notes (Signed)
Hematology and Oncology Follow Up Visit  Kelly Chavez [id number] 1946-09-24 69 y.o. 11/19/2014 1:55 PM Kelly Chavez, MDSnider, Kelly Griffins, MD   Principle Diagnosis: 69 year old woman with stage IIB (T2 N1) right-sided breast cancer. 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   Prior Therapy:  She is status post biopsy on 04/09/2014 which confirmed the presence of invasive carcinoma with axillary lymph node involvement. She is status post right modified radical mastectomy and insertion of a Port-A-Cath done on 06/15/2014. Her final pathology revealed T4bN1a. Her tumor showed lymphovascular invasion with involvement of the nipple but not the nipple and dermal lymphatics. One out of 16 lymph nodes involved.  Current therapy: Under evaluation for systemic therapy. She has continued to refuse therapy for the time being.  Interim History:  [name] presents today for a followup visit. Since her last visit, she continues to improve dramatically. She is reporting some soreness around the incision site and on her arm but is subsiding clinically. She also have reported  lymphedema noted in her right arm. She is still ambulatory using two canes without any decline. Her appetite and performance status continue to be excellent. Her mood have also been relatively stable. She does not report any constitutional symptoms of fevers or chills or sweats. Has not reported any weight loss or appetite changes. She has not reported any headaches or blurry vision or syncope. She has not reported any seizure activity. Not reporting any chest pain shortness of breath cough or hemoptysis. She she has not reported any nausea or vomiting or abdominal pain. Has not reported any frequency urgency or hesitancy. Has not reported any skeletal complaints. Has not reported any arthralgias or myalgias. She is reporting however a lot of anxiety and  depression related to her diagnosis and she is mentally not ready to proceed with any further therapy. Rest of the review of systems unremarkable.    Medications: I have reviewed the patient's current medications.  Current Outpatient Prescriptions  Medication Sig Dispense Refill  . amLODipine (NORVASC) 10 MG tablet Take 10 mg by mouth daily.    Marland Kitchen atenolol (TENORMIN) 50 MG tablet Take 50 mg by mouth daily.    . Darunavir Ethanolate (PREZISTA) 800 MG tablet Take 1 tablet (800 mg total) by mouth daily. 90 tablet 3  . emtricitabine-tenofovir (TRUVADA) 200-300 MG per tablet Take 1 tablet by mouth daily. 90 tablet 3  . fluvastatin (LESCOL) 40 MG capsule Take 40 mg by mouth 2 (two) times daily.    . hydrochlorothiazide (MICROZIDE) 12.5 MG capsule Take 1 capsule (12.5 mg total) by mouth daily. 30 capsule 3  . HYDROcodone-acetaminophen (NORCO/VICODIN) 5-325 MG per tablet Take 1-2 tablets by mouth every 4 (four) hours as needed for moderate pain. 30 tablet 0  . lidocaine-prilocaine (EMLA) cream Apply 1 application topically as needed. 30 g 0  . ritonavir (NORVIR) 100 MG TABS tablet Take 1 tablet (100 mg total) by mouth daily. 90 tablet 3   No current facility-administered medications for this visit.     Allergies: No Known Allergies  Past Medical History, Surgical history, Social history, and Family History were reviewed and updated.  Physical Exam: Blood pressure 163/72, pulse 62, temperature 97.5 F (36.4 C), temperature source Oral, resp. rate 18, height 5' 5"  (1.651 m), weight 193 lb 4.8 oz (87.68 kg), SpO2 100 %. ECOG: 1 General appearance: alert and cooperative. Mood fairly stable. Head: Normocephalic, without  obvious abnormality Neck: no adenopathy Lymph nodes: Cervical, supraclavicular, and axillary nodes normal. Heart:regular rate and rhythm, S1, S2 normal, no murmur, click, rub or gallop Chest wall examination: Well-healed scar noted. No erythema or induration. Lung: Clear without  wheezes, rhonchi or dullness to percussion. Abdomin: soft, non-tender, without masses or organomegaly EXT:no erythema, induration, or nodules. Increase right arm edema.  Skin showed no rashes or lesions.  Lab Results: Lab Results  Component Value Date   WBC 6.1 11/17/2014   HGB 11.5* 11/17/2014   HCT 38.3 11/17/2014   MCV 95.5 11/17/2014   PLT 212 11/17/2014     Chemistry      Component Value Date/Time   NA 143 11/17/2014 0916   NA 138 10/20/2014 1803   K 4.2 11/17/2014 0916   K 4.2 10/20/2014 1803   CL 104 10/20/2014 1803   CO2 23 11/17/2014 0916   CO2 24 10/20/2014 1803   BUN 19.1 11/17/2014 0916   BUN 20 10/20/2014 1803   CREATININE 0.9 11/17/2014 0916   CREATININE 0.96 10/20/2014 1803   CREATININE 0.88 06/12/2014 1507      Component Value Date/Time   CALCIUM 8.5 11/17/2014 0916   CALCIUM 9.1 10/20/2014 1803   ALKPHOS 127 11/17/2014 0916   ALKPHOS 114 10/20/2014 1803   AST 26 11/17/2014 0916   AST 24 10/20/2014 1803   ALT 23 11/17/2014 0916   ALT 21 10/20/2014 1803   BILITOT <0.20 11/17/2014 0916   BILITOT 0.3 10/20/2014 1803     EXAM: NUCLEAR MEDICINE PET SKULL BASE TO THIGH  TECHNIQUE: 10.9 mCi F-18 FDG was injected intravenously. Full-ring PET imaging was performed from the skull base to thigh after the radiotracer. CT data was obtained and used for attenuation correction and anatomic localization.  FASTING BLOOD GLUCOSE: Value: 81 mg/dl  COMPARISON: 04/30/2014  FINDINGS: NECK  No hypermetabolic lymph nodes identified within the neck.  CHEST  Postoperative change within the right chest wall compatible with mastectomy. Increased uptake within the pectoralis musculature is identified. This is nonspecific and may reflect post therapeutic inflammation. Status post right axillary nodal dissection. There is increased uptake in the right axilla which in the setting of surgery an external beam radiation is nonspecific and may be  inflammatory. The heart size appears mildly enlarged. There is a small hiatal hernia. The trachea is patent and midline. Normal appearance of the esophagus. No hypermetabolic mediastinal or hilar lymph nodes. No pleural effusion. No hypermetabolic pulmonary nodule or mass identified.  ABDOMEN/PELVIS  No abnormal hypermetabolic activity within the liver, pancreas, or spleen. Nonspecific FDG uptake within the left adrenal gland has an SUV max equal to 3.4. This is compared with 2.3 previously. No hypermetabolic lymph nodes in the abdomen or pelvis.  SKELETON  No focal hypermetabolic activity to suggest skeletal metastasis.  IMPRESSION: 1. No acute findings and no specific features identified to suggest residual/recurrence of tumor or metastatic disease. 2. Status post right mastectomy and right axillary nodal dissection. Increased uptake in the right chest wall and right axilla is nonspecific and may reflect post therapeutic inflammation. 3. Mild asymmetric increased uptake in the left adrenal gland is nonspecific and likely physiologic. No discrete mass identified. Attention on followup imaging is recommended.  Impression and Plan:  69 year old woman with the following issues:  1. Recent diagnosis of invasive ductal carcinoma of the right breast presented with a 4 cm mass as well as palpable adenopathy. She is status post mastectomy with her tumor found to be T4b.N1a disease with  nipple involvement. PET scan on 11/17/2014 was reviewed today and showed no evidence of metastatic disease.  The role adjuvant chemotherapy as well as risks and benefits were discussed today. She is still very concerned about the side effects despite my counseling today of the supportive measures we have to alleviate those effects. I feel that she is mentally in a better place but still not motivated enough to proceed with systemic chemotherapy. She is very happy about the results from the PET scan and  are certainly encouraging. But she also understands that she is at a high risk of recurrence without any adjuvant chemotherapy. The plan at this point is to continue with observation and surveillance and reevaluate her in 8 weeks.  2. IV access: Port-A-Cath inserted and will be utilized for systemic chemotherapy. This will be flushed every 8 weeks.  3. Adjuvant radiation therapy: She understands that she will require adjuvant radiation therapy upon completion of systemic chemotherapy. She is not really sure she was to proceed with this either but I will continue to address that with her.  4. Anxiety/depression: She continues to receive consoling for that. She is better today.   5. Follow-up: Will be in 8 weeks sooner if needed to.  Zola Button, MD 2/4/20161:55 PM

## 2014-11-23 ENCOUNTER — Other Ambulatory Visit: Payer: Self-pay | Admitting: *Deleted

## 2014-11-23 DIAGNOSIS — I10 Essential (primary) hypertension: Secondary | ICD-10-CM

## 2014-11-23 MED ORDER — ATENOLOL 50 MG PO TABS: 50.0000 mg | ORAL_TABLET | Freq: Every day | ORAL | Status: DC

## 2014-11-23 NOTE — Telephone Encounter (Signed)
Pt not comfortable with Dr. Liston Alba as a PCP and not going to her office in the future.

## 2014-12-01 ENCOUNTER — Ambulatory Visit: Payer: No Typology Code available for payment source | Admitting: Internal Medicine

## 2015-01-13 ENCOUNTER — Ambulatory Visit: Payer: No Typology Code available for payment source | Admitting: Internal Medicine

## 2015-01-14 ENCOUNTER — Ambulatory Visit: Payer: No Typology Code available for payment source | Admitting: Internal Medicine

## 2015-01-15 ENCOUNTER — Ambulatory Visit (INDEPENDENT_AMBULATORY_CARE_PROVIDER_SITE_OTHER): Payer: No Typology Code available for payment source | Admitting: Internal Medicine

## 2015-01-15 ENCOUNTER — Encounter: Payer: Self-pay | Admitting: Internal Medicine

## 2015-01-15 VITALS — BP 152/92 | HR 64 | Temp 98.0°F | Ht 64.0 in | Wt 189.0 lb

## 2015-01-15 DIAGNOSIS — Z21 Asymptomatic human immunodeficiency virus [HIV] infection status: Secondary | ICD-10-CM

## 2015-01-15 DIAGNOSIS — C50911 Malignant neoplasm of unspecified site of right female breast: Secondary | ICD-10-CM | POA: Diagnosis not present

## 2015-01-15 DIAGNOSIS — F22 Delusional disorders: Secondary | ICD-10-CM | POA: Diagnosis not present

## 2015-01-15 DIAGNOSIS — I10 Essential (primary) hypertension: Secondary | ICD-10-CM | POA: Diagnosis not present

## 2015-01-18 ENCOUNTER — Other Ambulatory Visit: Payer: Self-pay | Admitting: *Deleted

## 2015-01-18 DIAGNOSIS — C50211 Malignant neoplasm of upper-inner quadrant of right female breast: Secondary | ICD-10-CM

## 2015-01-18 NOTE — Progress Notes (Signed)
Patient ID: Kelly Chavez, female   DOB: [birth year], 69 y.o.   MRN: [id number]       Patient ID: Kelly Chavez, female   DOB: 1946-09-25, 69 y.o.   MRN: [id number]  HPI Kelly Chavez is a 69 year old woman with well controlled HIV disease, CD 4 count of 560/VL<20, on truvada-boosted darunavir. 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 She is status post right modified radical mastectomy and insertion of a Port-A-Cath done on 06/15/2014. Her PET scan in Feb 2016 did not show any metastatic disease. She follows up in oncology every 2 month to flush port as well as meet with oncologist to discuss well being and any decisions for treatment. For now, Kelly Chavez has decided not to undergo systemic chemotherapy.  She reports that a few weeks ago, she had lost her 68 yr. old mother. She feels that her mother lived a good life, still would not impact her decision to seek care for breast cancer.  She denies any fever, chills, nightsweats, no pain at chest wall from mastectomy but still has some mild asymmetry to right arm vs. Left arm. Good appetite, she feels slow getting around the home and her appointments, which she took public transit to attend appointment.  We discussed option for community home health RN, amber, to come visit her.  Outpatient Encounter Prescriptions as of 01/15/2015  Medication Sig  . atenolol (TENORMIN) 50 MG tablet Take 1 tablet (50 mg total) by mouth daily.  . Darunavir Ethanolate (PREZISTA) 800 MG tablet Take 1 tablet (800 mg total) by mouth daily.  Marland Kitchen emtricitabine-tenofovir (TRUVADA) 200-300 MG per tablet Take 1 tablet by mouth daily.  Marland Kitchen HYDROcodone-acetaminophen (NORCO/VICODIN) 5-325 MG per tablet Take 1-2 tablets by mouth every 4 (four) hours as needed for moderate pain.  Marland Kitchen lidocaine-prilocaine (EMLA) cream Apply 1  application topically as needed.  . ritonavir (NORVIR) 100 MG TABS tablet Take 1 tablet (100 mg total) by mouth daily.  Marland Kitchen amLODipine (NORVASC) 10 MG tablet Take 10 mg by mouth daily.  . fluvastatin (LESCOL) 40 MG capsule Take 40 mg by mouth 2 (two) times daily.  . hydrochlorothiazide (MICROZIDE) 12.5 MG capsule Take 1 capsule (12.5 mg total) by mouth daily. (Patient not taking: Reported on 01/15/2015)     Patient Active Problem List   Diagnosis Date Noted  . Hyperlipidemia 05/31/2014  . Pedal edema 05/11/2014  . Paranoia 05/11/2014  . Breast cancer of upper-inner quadrant of right female breast 04/15/2014  . History of pituitary tumor 01/28/2014  . Avascular necrosis of bones of both hips 01/28/2014  . Genital herpes 01/28/2014  . Ovarian cyst, bilateral 01/26/2014  . Essential hypertension, benign 01/26/2014  . Unspecified vitamin D deficiency 01/26/2014  . DJD (degenerative joint disease) 01/26/2014  . HIV disease 01/07/2014     Health Maintenance Due  Topic Date Due  . TETANUS/TDAP  10/21/1965  . COLONOSCOPY  10/21/1996  . ZOSTAVAX  10/21/2006  . DEXA SCAN  10/22/2011     Review of Systems  Constitutional: Negative for fever, chills, diaphoresis, activity change, appetite change, fatigue and unexpected weight change.  HENT: Negative for congestion, sore throat, rhinorrhea, sneezing, trouble swallowing and sinus pressure.  Eyes: Negative for photophobia and visual disturbance.  Respiratory: Negative for cough, chest tightness, shortness of breath, wheezing and stridor.  Cardiovascular: Negative for chest pain, palpitations  and leg swelling.  Gastrointestinal: Negative for nausea, vomiting, abdominal pain, diarrhea, constipation, blood in stool, abdominal distention and anal bleeding.  Genitourinary: Negative for dysuria, hematuria, flank pain and difficulty urinating.  Musculoskeletal: Negative for myalgias, back pain, joint swelling, arthralgias and gait problem.  Skin:  Negative for color change, pallor, rash and wound.  Neurological: Negative for dizziness, tremors, weakness and light-headedness.  Hematological: Negative for adenopathy. Does not bruise/bleed easily.  Psychiatric/Behavioral: Negative for behavioral problems, confusion, sleep disturbance, dysphoric mood, decreased concentration and agitation.    Physical Exam   BP 152/92 mmHg  Pulse 64  Temp(Src) 98 F (36.7 C) (Oral)  Ht 5' 4"  (1.626 m)  Wt 189 lb (85.73 kg)  BMI 32.43 kg/m2 Physical Exam  Constitutional:  oriented to person, place, and time. appears well-developed and well-nourished. No distress.  HENT:  Mouth/Throat: Oropharynx is clear and moist. No oropharyngeal exudate.  Cardiovascular: Normal rate, regular rhythm and normal heart sounds. Exam reveals no gallop and no friction rub.  No murmur heard.  Pulmonary/Chest: Effort normal and breath sounds normal. No respiratory distress.  has no wheezes.  Chest wall = left sided port is nontender. Mastectomy in right Lymphadenopathy: no cervical adenopathy.  Neurological: alert and oriented to person, place, and time. Ext: asymmetric enlarged right vs. Left arm, no worse  Skin: Skin is warm and dry. No rash noted. No erythema.    Lab Results  Component Value Date   CD4TCELL 24* 10/20/2014   Lab Results  Component Value Date   CD4TABS 560 10/20/2014   CD4TABS 640 01/22/2014   Lab Results  Component Value Date   HIV1RNAQUANT <20 10/20/2014   Lab Results  Component Value Date   HEPBSAB NEG 01/22/2014   No results found for: RPR  CBC Lab Results  Component Value Date   WBC 6.1 11/17/2014   RBC 4.01 11/17/2014   HGB 11.5* 11/17/2014   HCT 38.3 11/17/2014   PLT 212 11/17/2014   MCV 95.5 11/17/2014   MCH 28.7 11/17/2014   MCHC 30.0* 11/17/2014   RDW 14.9* 11/17/2014   LYMPHSABS 2.0 11/17/2014   MONOABS 0.5 11/17/2014   EOSABS 0.1 11/17/2014   BASOSABS 0.0 11/17/2014   BMET Lab Results  Component Value Date     NA 143 11/17/2014   K 4.2 11/17/2014   CL 104 10/20/2014   CO2 23 11/17/2014   GLUCOSE 100 11/17/2014   BUN 19.1 11/17/2014   CREATININE 0.9 11/17/2014   CALCIUM 8.5 11/17/2014   GFRNONAA 61 10/20/2014   GFRAA 71 10/20/2014     Assessment and Plan HIV disease = well controlled, continue on current regimen  HTN= elevated today, but just came in from waiting room. Will not aim for tight control since only slightly above goal. Will continue to monitor  Breast cancer = will continue to support her decision at this time, she does not want to have further chemotherapy at this time.  Paranoia = she does not endorse seeing people or thinking people are coming into her home, as she has had in the past. She is still guarded in having RN come to her home  Primary care = would like to establish her with a provider since she does not have PCP listed other than myself. I feel that she is looking frail in comparison to when I saw her last. She would benefit from having PCP also involved in her care.

## 2015-01-19 ENCOUNTER — Telehealth: Payer: Self-pay | Admitting: Oncology

## 2015-01-19 ENCOUNTER — Other Ambulatory Visit (HOSPITAL_BASED_OUTPATIENT_CLINIC_OR_DEPARTMENT_OTHER): Payer: No Typology Code available for payment source

## 2015-01-19 ENCOUNTER — Ambulatory Visit (HOSPITAL_BASED_OUTPATIENT_CLINIC_OR_DEPARTMENT_OTHER): Payer: No Typology Code available for payment source

## 2015-01-19 ENCOUNTER — Ambulatory Visit (HOSPITAL_BASED_OUTPATIENT_CLINIC_OR_DEPARTMENT_OTHER): Payer: No Typology Code available for payment source | Admitting: Oncology

## 2015-01-19 VITALS — BP 164/61 | HR 55 | Temp 97.7°F | Resp 18 | Ht 64.0 in | Wt 193.0 lb

## 2015-01-19 DIAGNOSIS — C50211 Malignant neoplasm of upper-inner quadrant of right female breast: Secondary | ICD-10-CM

## 2015-01-19 DIAGNOSIS — Z95828 Presence of other vascular implants and grafts: Secondary | ICD-10-CM

## 2015-01-19 DIAGNOSIS — Z452 Encounter for adjustment and management of vascular access device: Secondary | ICD-10-CM | POA: Diagnosis not present

## 2015-01-19 DIAGNOSIS — Z171 Estrogen receptor negative status [ER-]: Secondary | ICD-10-CM | POA: Diagnosis not present

## 2015-01-19 DIAGNOSIS — D500 Iron deficiency anemia secondary to blood loss (chronic): Secondary | ICD-10-CM

## 2015-01-19 LAB — COMPREHENSIVE METABOLIC PANEL (CC13)
ALBUMIN: 3.4 g/dL — AB (ref 3.5–5.0)
ALT: 21 U/L (ref 0–55)
AST: 22 U/L (ref 5–34)
Alkaline Phosphatase: 125 U/L (ref 40–150)
Anion Gap: 8 mEq/L (ref 3–11)
BUN: 17.1 mg/dL (ref 7.0–26.0)
CO2: 24 meq/L (ref 22–29)
Calcium: 9 mg/dL (ref 8.4–10.4)
Chloride: 109 mEq/L (ref 98–109)
Creatinine: 0.9 mg/dL (ref 0.6–1.1)
EGFR: 77 mL/min/{1.73_m2} — ABNORMAL LOW (ref >90)
Glucose: 76 mg/dl (ref 70–140)
Potassium: 4.2 mEq/L (ref 3.5–5.1)
SODIUM: 141 meq/L (ref 136–145)
TOTAL PROTEIN: 7.6 g/dL (ref 6.4–8.3)
Total Bilirubin: <0.2 mg/dL (ref 0.20–1.20)

## 2015-01-19 LAB — CBC WITH DIFFERENTIAL/PLATELET
BASO%: 0.2 % (ref 0.0–2.0)
BASOS ABS: 0 10*3/uL (ref 0.0–0.1)
EOS ABS: 0.1 10*3/uL (ref 0.0–0.5)
EOS%: 1.3 % (ref 0.0–7.0)
HEMATOCRIT: 38.5 % (ref 34.8–46.6)
HEMOGLOBIN: 12.1 g/dL (ref 11.6–15.9)
LYMPH%: 35.3 % (ref 14.0–49.7)
MCH: 30 pg (ref 25.1–34.0)
MCHC: 31.4 g/dL — ABNORMAL LOW (ref 31.5–36.0)
MCV: 95.3 fL (ref 79.5–101.0)
MONO#: 0.5 10*3/uL (ref 0.1–0.9)
MONO%: 9.3 % (ref 0.0–14.0)
NEUT#: 3 10*3/uL (ref 1.5–6.5)
NEUT%: 53.9 % (ref 38.4–76.8)
Platelets: 160 10*3/uL (ref 145–400)
RBC: 4.04 10*6/uL (ref 3.70–5.45)
RDW: 14.7 % — ABNORMAL HIGH (ref 11.2–14.5)
WBC: 5.5 10*3/uL (ref 3.9–10.3)
lymph#: 1.9 10*3/uL (ref 0.9–3.3)

## 2015-01-19 MED ORDER — HEPARIN SOD (PORK) LOCK FLUSH 100 UNIT/ML IV SOLN
500.0000 [IU] | Freq: Once | INTRAVENOUS | Status: AC
Start: 2015-01-19 — End: 2015-01-19
  Administered 2015-01-19: 500 [IU] via INTRAVENOUS
  Filled 2015-01-19: qty 5

## 2015-01-19 MED ORDER — SODIUM CHLORIDE 0.9 % IJ SOLN
10.0000 mL | INTRAMUSCULAR | Status: DC | PRN
  Administered 2015-01-19: 10 mL via INTRAVENOUS
  Filled 2015-01-19: qty 10

## 2015-01-19 NOTE — Telephone Encounter (Signed)
gave and printed appt sched and avs for pt for June...pt did not want to come wee of 5.30

## 2015-01-19 NOTE — Progress Notes (Signed)
Hematology and Oncology Follow Up Visit  Kelly Chavez [id number] November 15, 1945 70 y.o. 01/19/2015 1:44 PM Kelly Chavez, Kelly Chavez, Kelly Griffins, MD   Principle Diagnosis: 69 year old woman with stage IIB (T2 N1) right-sided breast cancer. 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   Prior Therapy:  She is status post biopsy on 04/09/2014 which confirmed the presence of invasive carcinoma with axillary lymph node involvement. She is status post right modified radical mastectomy and insertion of a Port-A-Cath done on 06/15/2014. Her final pathology revealed T4bN1a. Her tumor showed lymphovascular invasion with involvement of the nipple but not the nipple and dermal lymphatics. One out of 16 lymph nodes involved.  Current therapy: Under evaluation for systemic therapy. She has continued to refuse therapy for the time being.  Interim History:  Ms. Kelly Chavez presents today for a followup visit. Since her last visit, she continues to do well. She is fully recovered from her operation without any delayed effects. She does have some mild right-sided lymphedema which have been stable. Her appetite have been excellent and her mobility is back to normal. He is able to drive and attends to activities of daily living without any decline. She is reporting some soreness around the incision site and on her arm but is subsiding clinically. . Her mood have also been relatively stable.   She does not report any constitutional symptoms of fevers or chills or sweats. Has not reported any weight loss or appetite changes. She has not reported any headaches or blurry vision or syncope. She has not reported any seizure activity. Not reporting any chest pain shortness of breath cough or hemoptysis. She she has not reported any nausea or vomiting or abdominal pain. Has not reported any frequency urgency or hesitancy. Has not reported any skeletal  complaints. Has not reported any arthralgias or myalgias. She is reporting however a lot of anxiety and depression related to her diagnosis and she is mentally not ready to proceed with any further therapy. Rest of the review of systems unremarkable.    Medications: I have reviewed the patient's current medications.  Current Outpatient Prescriptions  Medication Sig Dispense Refill  . amLODipine (NORVASC) 10 MG tablet Take 10 mg by mouth daily.    Marland Kitchen atenolol (TENORMIN) 50 MG tablet Take 1 tablet (50 mg total) by mouth daily. 30 tablet 3  . Darunavir Ethanolate (PREZISTA) 800 MG tablet Take 1 tablet (800 mg total) by mouth daily. 90 tablet 3  . emtricitabine-tenofovir (TRUVADA) 200-300 MG per tablet Take 1 tablet by mouth daily. 90 tablet 3  . hydrochlorothiazide (MICROZIDE) 12.5 MG capsule Take 1 capsule (12.5 mg total) by mouth daily. 30 capsule 3  . HYDROcodone-acetaminophen (NORCO/VICODIN) 5-325 MG per tablet Take 1-2 tablets by mouth every 4 (four) hours as needed for moderate pain. 30 tablet 0  . lidocaine-prilocaine (EMLA) cream Apply 1 application topically as needed. 30 g 0  . ritonavir (NORVIR) 100 MG TABS tablet Take 1 tablet (100 mg total) by mouth daily. 90 tablet 3  . fluvastatin (LESCOL) 40 MG capsule Take 40 mg by mouth 2 (two) times daily.     No current facility-administered medications for this visit.     Allergies: No Known Allergies  Past Medical History, Surgical history, Social history, and Family History were reviewed and updated.  Physical Exam: Blood pressure 164/61, pulse 55, temperature 97.7 F (36.5 C), temperature source Oral, resp. rate 18, height 5'  4" (1.626 m), weight 193 lb (87.544 kg), SpO2 100 %. ECOG: 1 General appearance: alert and cooperative. Mood fairly stable. Head: Normocephalic, without obvious abnormality Neck: no adenopathy Lymph nodes: Cervical, supraclavicular, and axillary nodes normal. Heart:regular rate and rhythm, S1, S2 normal, no  murmur, click, rub or gallop Chest wall examination: Well-healed scar noted. No erythema or induration. Lung: Clear without wheezes, rhonchi or dullness to percussion. Abdomin: soft, non-tender, without masses or organomegaly EXT:no erythema, induration, or nodules. Increase right arm edema.  Skin showed no rashes or lesions.  Lab Results: Lab Results  Component Value Date   WBC 6.1 11/17/2014   HGB 11.5* 11/17/2014   HCT 38.3 11/17/2014   MCV 95.5 11/17/2014   PLT 212 11/17/2014     Chemistry      Component Value Date/Time   NA 143 11/17/2014 0916   NA 138 10/20/2014 1803   K 4.2 11/17/2014 0916   K 4.2 10/20/2014 1803   CL 104 10/20/2014 1803   CO2 23 11/17/2014 0916   CO2 24 10/20/2014 1803   BUN 19.1 11/17/2014 0916   BUN 20 10/20/2014 1803   CREATININE 0.9 11/17/2014 0916   CREATININE 0.96 10/20/2014 1803   CREATININE 0.88 06/12/2014 1507      Component Value Date/Time   CALCIUM 8.5 11/17/2014 0916   CALCIUM 9.1 10/20/2014 1803   ALKPHOS 127 11/17/2014 0916   ALKPHOS 114 10/20/2014 1803   AST 26 11/17/2014 0916   AST 24 10/20/2014 1803   ALT 23 11/17/2014 0916   ALT 21 10/20/2014 1803   BILITOT <0.20 11/17/2014 0916   BILITOT 0.3 10/20/2014 1803      Impression and Plan:  69 year old woman with the following issues:  1. Recent diagnosis of invasive ductal carcinoma of the right breast presented with a 4 cm mass as well as palpable adenopathy. She is status post mastectomy with her tumor found to be T4b.N1a disease with nipple involvement. PET scan on 11/17/2014 was reviewed today and showed no evidence of metastatic disease.  The role adjuvant chemotherapy was discussed again and she continues to refuse. She is still very concerned about the side effects despite my counseling today of the supportive measures we have to alleviate those effects. She understands why not receiving chemotherapy she is at increased risk of cancer recurrence. Despite that, she have  continued to decline at. I have offered her alternatively is continuous observation and surveillance with repeat PET/CT scan in 6 months and she is open to this idea instead.  2. IV access: Port-A-Cath inserted and will be utilized for systemic chemotherapy. This will be flushed every 8 weeks.  3. Adjuvant radiation therapy: She continues to decline this is well.  4. Anxiety/depression: She continues to receive consoling for that. She is stable from that standpoint.  5. Follow-up: Will be in 8 weeks sooner if needed to.  Zola Button, MD 4/5/20161:44 PM

## 2015-01-19 NOTE — Patient Instructions (Signed)

## 2015-02-08 ENCOUNTER — Other Ambulatory Visit: Payer: Self-pay | Admitting: *Deleted

## 2015-02-08 DIAGNOSIS — B20 Human immunodeficiency virus [HIV] disease: Secondary | ICD-10-CM

## 2015-02-08 MED ORDER — RITONAVIR 100 MG PO TABS: 100.0000 mg | ORAL_TABLET | Freq: Every day | ORAL | Status: DC

## 2015-02-08 MED ORDER — DARUNAVIR ETHANOLATE 800 MG PO TABS: 800.0000 mg | ORAL_TABLET | Freq: Every day | ORAL | Status: DC

## 2015-02-08 MED ORDER — EMTRICITABINE-TENOFOVIR DF 200-300 MG PO TABS: 1.0000 | ORAL_TABLET | Freq: Every day | ORAL | Status: DC

## 2015-02-16 ENCOUNTER — Other Ambulatory Visit: Payer: Self-pay | Admitting: *Deleted

## 2015-02-16 DIAGNOSIS — I10 Essential (primary) hypertension: Secondary | ICD-10-CM

## 2015-02-16 MED ORDER — ATENOLOL 50 MG PO TABS: 50.0000 mg | ORAL_TABLET | Freq: Every day | ORAL | Status: DC

## 2015-02-22 ENCOUNTER — Ambulatory Visit: Payer: No Typology Code available for payment source | Admitting: Internal Medicine

## 2015-03-17 ENCOUNTER — Other Ambulatory Visit: Payer: Self-pay | Admitting: *Deleted

## 2015-03-17 DIAGNOSIS — B20 Human immunodeficiency virus [HIV] disease: Secondary | ICD-10-CM

## 2015-03-17 MED ORDER — EMTRICITABINE-TENOFOVIR DF 200-300 MG PO TABS: 1.0000 | ORAL_TABLET | Freq: Every day | ORAL | Status: DC

## 2015-03-18 ENCOUNTER — Ambulatory Visit (INDEPENDENT_AMBULATORY_CARE_PROVIDER_SITE_OTHER): Payer: No Typology Code available for payment source | Admitting: Internal Medicine

## 2015-03-18 ENCOUNTER — Encounter: Payer: Self-pay | Admitting: Internal Medicine

## 2015-03-18 VITALS — BP 172/101 | HR 64 | Temp 97.5°F | Ht 65.0 in | Wt 189.5 lb

## 2015-03-18 DIAGNOSIS — B20 Human immunodeficiency virus [HIV] disease: Secondary | ICD-10-CM

## 2015-03-18 DIAGNOSIS — R600 Localized edema: Secondary | ICD-10-CM | POA: Diagnosis not present

## 2015-03-18 DIAGNOSIS — Z23 Encounter for immunization: Secondary | ICD-10-CM

## 2015-03-18 DIAGNOSIS — I10 Essential (primary) hypertension: Secondary | ICD-10-CM | POA: Diagnosis not present

## 2015-03-18 MED ORDER — HYDROCHLOROTHIAZIDE 12.5 MG PO CAPS: 12.5000 mg | ORAL_CAPSULE | Freq: Every day | ORAL | Status: DC

## 2015-03-19 LAB — T-HELPER CELL (CD4) - (RCID CLINIC ONLY)
CD4 % Helper T Cell: 23 % — ABNORMAL LOW (ref 33–55)
CD4 T Cell Abs: 490 /uL (ref 400–2700)

## 2015-03-21 LAB — HIV-1 RNA QUANT-NO REFLEX-BLD
HIV 1 RNA Quant: 31 copies/mL — ABNORMAL HIGH (ref <20)
HIV-1 RNA Quant, Log: 1.49 {Log} — ABNORMAL HIGH (ref <1.30)

## 2015-03-24 ENCOUNTER — Other Ambulatory Visit (HOSPITAL_BASED_OUTPATIENT_CLINIC_OR_DEPARTMENT_OTHER): Payer: No Typology Code available for payment source

## 2015-03-24 ENCOUNTER — Ambulatory Visit (HOSPITAL_BASED_OUTPATIENT_CLINIC_OR_DEPARTMENT_OTHER): Payer: No Typology Code available for payment source | Admitting: Oncology

## 2015-03-24 ENCOUNTER — Ambulatory Visit (HOSPITAL_BASED_OUTPATIENT_CLINIC_OR_DEPARTMENT_OTHER): Payer: No Typology Code available for payment source

## 2015-03-24 ENCOUNTER — Telehealth: Payer: Self-pay | Admitting: Oncology

## 2015-03-24 VITALS — BP 161/70 | HR 51 | Temp 98.3°F | Resp 17 | Ht 65.0 in | Wt 186.9 lb

## 2015-03-24 DIAGNOSIS — R5383 Other fatigue: Secondary | ICD-10-CM | POA: Diagnosis not present

## 2015-03-24 DIAGNOSIS — C50211 Malignant neoplasm of upper-inner quadrant of right female breast: Secondary | ICD-10-CM

## 2015-03-24 DIAGNOSIS — Z95828 Presence of other vascular implants and grafts: Secondary | ICD-10-CM

## 2015-03-24 DIAGNOSIS — R591 Generalized enlarged lymph nodes: Secondary | ICD-10-CM | POA: Diagnosis not present

## 2015-03-24 DIAGNOSIS — D500 Iron deficiency anemia secondary to blood loss (chronic): Secondary | ICD-10-CM

## 2015-03-24 LAB — CBC WITH DIFFERENTIAL/PLATELET
BASO%: 0.9 % (ref 0.0–2.0)
Basophils Absolute: 0.1 10*3/uL (ref 0.0–0.1)
EOS ABS: 0.1 10*3/uL (ref 0.0–0.5)
EOS%: 1.7 % (ref 0.0–7.0)
HCT: 38.4 % (ref 34.8–46.6)
HGB: 12.1 g/dL (ref 11.6–15.9)
LYMPH%: 32 % (ref 14.0–49.7)
MCH: 29.7 pg (ref 25.1–34.0)
MCHC: 31.6 g/dL (ref 31.5–36.0)
MCV: 94 fL (ref 79.5–101.0)
MONO#: 0.5 10*3/uL (ref 0.1–0.9)
MONO%: 9.6 % (ref 0.0–14.0)
NEUT#: 3.1 10*3/uL (ref 1.5–6.5)
NEUT%: 55.8 % (ref 38.4–76.8)
Platelets: 185 10*3/uL (ref 145–400)
RBC: 4.08 10*6/uL (ref 3.70–5.45)
RDW: 14.5 % (ref 11.2–14.5)
WBC: 5.6 10*3/uL (ref 3.9–10.3)
lymph#: 1.8 10*3/uL (ref 0.9–3.3)

## 2015-03-24 MED ORDER — SODIUM CHLORIDE 0.9 % IJ SOLN
10.0000 mL | INTRAMUSCULAR | Status: DC | PRN
  Administered 2015-03-24: 10 mL via INTRAVENOUS
  Filled 2015-03-24: qty 10

## 2015-03-24 MED ORDER — HEPARIN SOD (PORK) LOCK FLUSH 100 UNIT/ML IV SOLN
500.0000 [IU] | Freq: Once | INTRAVENOUS | Status: AC
  Administered 2015-03-24: 500 [IU] via INTRAVENOUS
  Filled 2015-03-24: qty 5

## 2015-03-24 NOTE — Patient Instructions (Signed)

## 2015-03-24 NOTE — Progress Notes (Signed)
Hematology and Oncology Follow Up Visit  Kelly Chavez [id number] Sep 26, 1946 69 y.o. 03/24/2015 3:41 PM Kelly Chavez, MDSnider, Kelly Griffins, MD   Principle Diagnosis: 69 year old woman with stage IIB (T2 N1) right-sided breast cancer. 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   Prior Therapy:  She is status post biopsy on 04/09/2014 which confirmed the presence of invasive carcinoma with axillary lymph node involvement. She is status post right modified radical mastectomy and insertion of a Port-A-Cath done on 06/15/2014. Her final pathology revealed T4bN1a. Her tumor showed lymphovascular invasion with involvement of the nipple but not the nipple and dermal lymphatics. One out of 16 lymph nodes involved.  Current therapy:  She has continued to refuse systemic adjuvant therapy for the time being.  Interim History:  Kelly Chavez presents today for a followup visit. Since her last visit, she continues to recover well. She does have some mild right-sided lymphedema which have been stable. Her appetite have been excellent and her mobility is close to normal. He is able to drive and attends to activities of daily living without any decline. She still have some fatigue on exertion and has to stop after a period of time together her strength. She is using a walker as well. She is reporting some soreness around the incision site and on her arm but is subsiding clinically. Her mood have also been relatively stable.   She does not report any constitutional symptoms of fevers or chills or sweats. Has not reported any weight loss or appetite changes. She has not reported any headaches or blurry vision or syncope. She has not reported any seizure activity. Not reporting any chest pain shortness of breath cough or hemoptysis. She she has not reported any nausea or vomiting or abdominal pain. Has not reported any frequency urgency  or hesitancy. Has not reported any skeletal complaints. Has not reported any arthralgias or myalgias. She is reporting however a lot of anxiety and depression related to her diagnosis and she is mentally not ready to proceed with any further therapy. Rest of the review of systems unremarkable.    Medications: I have reviewed the patient's current medications.  Current Outpatient Prescriptions  Medication Sig Dispense Refill  . amLODipine (NORVASC) 10 MG tablet Take 10 mg by mouth daily.    Marland Kitchen atenolol (TENORMIN) 50 MG tablet Take 1 tablet (50 mg total) by mouth daily. 30 tablet 3  . Darunavir Ethanolate (PREZISTA) 800 MG tablet Take 1 tablet (800 mg total) by mouth daily. 90 tablet 3  . emtricitabine-tenofovir (TRUVADA) 200-300 MG per tablet Take 1 tablet by mouth daily. 90 tablet 3  . fluvastatin (LESCOL) 40 MG capsule Take 40 mg by mouth 2 (two) times daily.    . hydrochlorothiazide (MICROZIDE) 12.5 MG capsule Take 1 capsule (12.5 mg total) by mouth daily. 30 capsule 3  . HYDROcodone-acetaminophen (NORCO/VICODIN) 5-325 MG per tablet Take 1-2 tablets by mouth every 4 (four) hours as needed for moderate pain. 30 tablet 0  . lidocaine-prilocaine (EMLA) cream Apply 1 application topically as needed. 30 g 0  . ritonavir (NORVIR) 100 MG TABS tablet Take 1 tablet (100 mg total) by mouth daily. 90 tablet 3   No current facility-administered medications for this visit.     Allergies: No Known Allergies  Past Medical History, Surgical history, Social history, and Family History were reviewed and updated.  Physical Exam: Blood pressure 161/70, pulse 51, temperature  98.3 F (36.8 C), temperature source Oral, resp. rate 17, height 5' 5"  (1.651 m), weight 186 lb 14.4 oz (84.777 kg), SpO2 96 %. ECOG: 1 General appearance: alert and cooperative. Mood stable. Her affect is appropriate. Head: Normocephalic, without obvious abnormality Neck: no adenopathy Lymph nodes: Cervical, supraclavicular, and  axillary nodes normal. Heart:regular rate and rhythm, S1, S2 normal, no murmur, click, rub or gallop Chest wall examination: Well-healed scar noted. No erythema or induration. Port-A-Cath in place without any complications. Lung: Clear without wheezes, rhonchi or dullness to percussion. Abdomin: soft, non-tender, without masses or organomegaly EXT:no erythema, induration, or nodules. Increase right arm edema.  Skin showed no rashes or lesions.  Lab Results: Lab Results  Component Value Date   WBC 5.6 03/24/2015   HGB 12.1 03/24/2015   HCT 38.4 03/24/2015   MCV 94.0 03/24/2015   PLT 185 03/24/2015     Chemistry      Component Value Date/Time   NA 141 01/19/2015 1317   NA 138 10/20/2014 1803   K 4.2 01/19/2015 1317   K 4.2 10/20/2014 1803   CL 104 10/20/2014 1803   CO2 24 01/19/2015 1317   CO2 24 10/20/2014 1803   BUN 17.1 01/19/2015 1317   BUN 20 10/20/2014 1803   CREATININE 0.9 01/19/2015 1317   CREATININE 0.96 10/20/2014 1803   CREATININE 0.88 06/12/2014 1507      Component Value Date/Time   CALCIUM 9.0 01/19/2015 1317   CALCIUM 9.1 10/20/2014 1803   ALKPHOS 125 01/19/2015 1317   ALKPHOS 114 10/20/2014 1803   AST 22 01/19/2015 1317   AST 24 10/20/2014 1803   ALT 21 01/19/2015 1317   ALT 21 10/20/2014 1803   BILITOT <0.20 01/19/2015 1317   BILITOT 0.3 10/20/2014 1803      Impression and Plan:  69 year old woman with the following issues:  1. Invasive ductal carcinoma of the right breast presented with a 4 cm mass as well as palpable adenopathy. She is status post mastectomy with her tumor found to be T4b.N1a disease with nipple involvement on 06/15/2014.   PET scan on 11/17/2014 showed no evidence of metastatic disease.  The role of chemotherapy was discussed again and she continues to refuse the idea at this time. She is still concerned about the side effects associated with chemotherapy. She understands without chemotherapy she is at increased risk of cancer  recurrence.   She is agreeable to continue with active surveillance and repeat imaging studies in 2 months. She is more open to chemotherapy as a last resort if the cancer comes back.  2. IV access: Port-A-Cath inserted and will be utilized for systemic chemotherapy. This will be flushed every 8 weeks.  3. Adjuvant radiation therapy: She declined that as well.  4. Anxiety/depression: We will have been stable at this time.  5. Follow-up: Will be in 8 weeks sooner if needed to.  [facility], MD 6/8/20163:41 PM

## 2015-03-24 NOTE — Telephone Encounter (Signed)
Gave and printed appt sched adn avs for pt for Aug °

## 2015-04-01 ENCOUNTER — Other Ambulatory Visit: Payer: Self-pay | Admitting: Physician Assistant

## 2015-04-09 NOTE — Progress Notes (Signed)
Patient ID: Kelly Chavez, female   DOB: 07-23-1946, 69 y.o.   MRN: [id number]       Patient ID: Kelly Chavez, female   DOB: 09-18-1946, 69 y.o.   MRN: [id number]  HPI 69yo F with HIV disease, well controlled, CD 4 count of 490/VL<20 on truvada/DRVr. 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 She has decided not to pursue chemotherapy at this time. She states that she is doing ok. No significant pain to right arm from lymphedema. She is still cautious in accepting advisement for her health. She has not established care with pcp. She is not interested in having home health rn visit.  Outpatient Encounter Prescriptions as of 03/18/2015  Medication Sig  . amLODipine (NORVASC) 10 MG tablet Take 10 mg by mouth daily.  Marland Kitchen atenolol (TENORMIN) 50 MG tablet Take 1 tablet (50 mg total) by mouth daily.  . Darunavir Ethanolate (PREZISTA) 800 MG tablet Take 1 tablet (800 mg total) by mouth daily.  Marland Kitchen emtricitabine-tenofovir (TRUVADA) 200-300 MG per tablet Take 1 tablet by mouth daily.  . fluvastatin (LESCOL) 40 MG capsule Take 40 mg by mouth 2 (two) times daily.  . hydrochlorothiazide (MICROZIDE) 12.5 MG capsule Take 1 capsule (12.5 mg total) by mouth daily.  Marland Kitchen HYDROcodone-acetaminophen (NORCO/VICODIN) 5-325 MG per tablet Take 1-2 tablets by mouth every 4 (four) hours as needed for moderate pain.  . ritonavir (NORVIR) 100 MG TABS tablet Take 1 tablet (100 mg total) by mouth daily.  . [DISCONTINUED] hydrochlorothiazide (MICROZIDE) 12.5 MG capsule Take 1 capsule (12.5 mg total) by mouth daily.  . [DISCONTINUED] lidocaine-prilocaine (EMLA) cream Apply 1 application topically as needed.   No facility-administered encounter medications on file as of 03/18/2015.     Patient Active Problem List   Diagnosis Date Noted  . Hyperlipidemia 05/31/2014  . Pedal edema 05/11/2014  . Paranoia 05/11/2014  . Breast cancer of upper-inner quadrant of right female breast 04/15/2014  . History of  pituitary tumor 01/28/2014  . Avascular necrosis of bones of both hips 01/28/2014  . Genital herpes 01/28/2014  . Ovarian cyst, bilateral 01/26/2014  . Essential hypertension, benign 01/26/2014  . Unspecified vitamin D deficiency 01/26/2014  . DJD (degenerative joint disease) 01/26/2014  . HIV disease 01/07/2014     Health Maintenance Due  Topic Date Due  . TETANUS/TDAP  10/21/1965  . COLONOSCOPY  10/21/1996  . ZOSTAVAX  10/21/2006  . DEXA SCAN  10/22/2011  . PNA vac Low Risk Adult (2 of 2 - PCV13) 01/23/2015     Review of Systems + paranoid, 10 point ros is negative Physical Exam   BP 172/101 mmHg  Pulse 64  Temp(Src) 97.5 F (36.4 C) (Oral)  Ht 5\' 5"  (1.651 m)  Wt 189 lb 8 oz (85.957 kg)  BMI 31.53 kg/m2 Physical Exam  Constitutional:  oriented to person, place, and time. appears well-developed and well-nourished. No distress.  HENT: [place]/AT, PERRLA, no scleral icterus Mouth/Throat: Oropharynx is clear and moist. No oropharyngeal exudate.  Cardiovascular: Normal rate, regular rhythm and normal heart sounds. Exam reveals no gallop and no friction rub.  No murmur heard.  Pulmonary/Chest: Effort normal and breath sounds normal. No respiratory distress.  has no wheezes.  Neck = supple, no nuchal rigidity Abdominal: Soft. Bowel sounds are normal.  exhibits no distension. There is no tenderness.  Lymphadenopathy: no cervical adenopathy. No axillary adenopathy Skin: Skin is warm and dry. No rash noted. No erythema. Well healed surgical incision of right breast.no  pain with port site Psychiatric: guarded Ext: + 1 edema right arm  Lab Results  Component Value Date   CD4TCELL 23* 03/18/2015   Lab Results  Component Value Date   CD4TABS 490 03/18/2015   CD4TABS 560 10/20/2014   CD4TABS 640 01/22/2014   Lab Results  Component Value Date   HIV1RNAQUANT 31* 03/18/2015   Lab Results  Component Value Date   HEPBSAB NEG 01/22/2014   No results found for: RPR  CBC Lab  Results  Component Value Date   WBC 5.6 03/24/2015   RBC 4.08 03/24/2015   HGB 12.1 03/24/2015   HCT 38.4 03/24/2015   PLT 185 03/24/2015   MCV 94.0 03/24/2015   MCH 29.7 03/24/2015   MCHC 31.6 03/24/2015   RDW 14.5 03/24/2015   LYMPHSABS 1.8 03/24/2015   MONOABS 0.5 03/24/2015   EOSABS 0.1 03/24/2015   BASOSABS 0.1 03/24/2015   BMET Lab Results  Component Value Date   NA 141 01/19/2015   K 4.2 01/19/2015   CL 104 10/20/2014   CO2 24 01/19/2015   GLUCOSE 76 01/19/2015   BUN 17.1 01/19/2015   CREATININE 0.9 01/19/2015   CALCIUM 9.0 01/19/2015   GFRNONAA 61 10/20/2014   GFRAA 71 10/20/2014     Assessment and Plan  hiv disease = well controlled, continue on current regimen. She is not interested in changing at this time.  htn = poorly controlled. Not interested in increasing her medications  Breast cancer = will discuss if she wants to keep her port in place with oncologist at next visit  Paranoia = appears to be stable, but would like her to see psychiatrist. She is hesistant in seeking care.

## 2015-04-15 ENCOUNTER — Telehealth: Payer: Self-pay | Admitting: *Deleted

## 2015-04-15 NOTE — Telephone Encounter (Signed)
Verified EMLA script from 04/01/15 with Pharmacist, Trish.

## 2015-04-29 ENCOUNTER — Other Ambulatory Visit: Payer: Self-pay | Admitting: *Deleted

## 2015-04-29 DIAGNOSIS — Z113 Encounter for screening for infections with a predominantly sexual mode of transmission: Secondary | ICD-10-CM

## 2015-04-29 DIAGNOSIS — B20 Human immunodeficiency virus [HIV] disease: Secondary | ICD-10-CM

## 2015-05-18 ENCOUNTER — Ambulatory Visit (INDEPENDENT_AMBULATORY_CARE_PROVIDER_SITE_OTHER): Payer: No Typology Code available for payment source | Admitting: Internal Medicine

## 2015-05-18 ENCOUNTER — Encounter: Payer: Self-pay | Admitting: Internal Medicine

## 2015-05-18 VITALS — BP 145/92 | HR 68 | Temp 98.2°F | Wt 187.0 lb

## 2015-05-18 DIAGNOSIS — C50911 Malignant neoplasm of unspecified site of right female breast: Secondary | ICD-10-CM | POA: Diagnosis not present

## 2015-05-18 DIAGNOSIS — I10 Essential (primary) hypertension: Secondary | ICD-10-CM | POA: Diagnosis not present

## 2015-05-18 DIAGNOSIS — B20 Human immunodeficiency virus [HIV] disease: Secondary | ICD-10-CM | POA: Diagnosis not present

## 2015-05-26 ENCOUNTER — Ambulatory Visit (HOSPITAL_BASED_OUTPATIENT_CLINIC_OR_DEPARTMENT_OTHER): Payer: No Typology Code available for payment source

## 2015-05-26 ENCOUNTER — Other Ambulatory Visit (HOSPITAL_BASED_OUTPATIENT_CLINIC_OR_DEPARTMENT_OTHER): Payer: No Typology Code available for payment source

## 2015-05-26 VITALS — BP 160/57 | HR 62 | Temp 98.1°F | Resp 16

## 2015-05-26 DIAGNOSIS — C50211 Malignant neoplasm of upper-inner quadrant of right female breast: Secondary | ICD-10-CM

## 2015-05-26 DIAGNOSIS — Z95828 Presence of other vascular implants and grafts: Secondary | ICD-10-CM

## 2015-05-26 LAB — CBC WITH DIFFERENTIAL/PLATELET
BASO%: 0.6 % (ref 0.0–2.0)
Basophils Absolute: 0 10*3/uL (ref 0.0–0.1)
EOS ABS: 0.1 10*3/uL (ref 0.0–0.5)
EOS%: 1.4 % (ref 0.0–7.0)
HCT: 38.7 % (ref 34.8–46.6)
HGB: 12.2 g/dL (ref 11.6–15.9)
LYMPH%: 34.9 % (ref 14.0–49.7)
MCH: 29.7 pg (ref 25.1–34.0)
MCHC: 31.6 g/dL (ref 31.5–36.0)
MCV: 94 fL (ref 79.5–101.0)
MONO#: 0.4 10*3/uL (ref 0.1–0.9)
MONO%: 7.8 % (ref 0.0–14.0)
NEUT#: 2.7 10*3/uL (ref 1.5–6.5)
NEUT%: 55.3 % (ref 38.4–76.8)
PLATELETS: 199 10*3/uL (ref 145–400)
RBC: 4.12 10*6/uL (ref 3.70–5.45)
RDW: 14.4 % (ref 11.2–14.5)
WBC: 5 10*3/uL (ref 3.9–10.3)
lymph#: 1.7 10*3/uL (ref 0.9–3.3)

## 2015-05-26 LAB — COMPREHENSIVE METABOLIC PANEL (CC13)
ALT: 24 U/L (ref 0–55)
AST: 22 U/L (ref 5–34)
Albumin: 3.5 g/dL (ref 3.5–5.0)
Alkaline Phosphatase: 119 U/L (ref 40–150)
Anion Gap: 9 mEq/L (ref 3–11)
BUN: 16.7 mg/dL (ref 7.0–26.0)
CO2: 24 mEq/L (ref 22–29)
CREATININE: 1 mg/dL (ref 0.6–1.1)
Calcium: 9.3 mg/dL (ref 8.4–10.4)
Chloride: 108 mEq/L (ref 98–109)
EGFR: 64 mL/min/{1.73_m2} — ABNORMAL LOW (ref >90)
Glucose: 76 mg/dl (ref 70–140)
Potassium: 4.2 mEq/L (ref 3.5–5.1)
Sodium: 141 mEq/L (ref 136–145)
Total Bilirubin: 0.28 mg/dL (ref 0.20–1.20)
Total Protein: 7.6 g/dL (ref 6.4–8.3)

## 2015-05-26 MED ORDER — HEPARIN SOD (PORK) LOCK FLUSH 100 UNIT/ML IV SOLN
500.0000 [IU] | Freq: Once | INTRAVENOUS | Status: AC
  Administered 2015-05-26: 500 [IU] via INTRAVENOUS
  Filled 2015-05-26: qty 5

## 2015-05-26 MED ORDER — SODIUM CHLORIDE 0.9 % IJ SOLN
10.0000 mL | INTRAMUSCULAR | Status: DC | PRN
  Administered 2015-05-26: 10 mL via INTRAVENOUS
  Filled 2015-05-26: qty 10

## 2015-05-26 NOTE — Patient Instructions (Signed)

## 2015-05-27 ENCOUNTER — Encounter (HOSPITAL_COMMUNITY)
Admission: RE | Admit: 2015-05-27 | Discharge: 2015-05-27 | Disposition: A | Discharge status: Home / Self Care | Payer: Medicare Other | Source: Ambulatory Visit | Attending: Oncology | Admitting: Oncology

## 2015-05-27 DIAGNOSIS — C50211 Malignant neoplasm of upper-inner quadrant of right female breast: Secondary | ICD-10-CM | POA: Diagnosis not present

## 2015-05-27 LAB — GLUCOSE, CAPILLARY: Glucose-Capillary: 75 mg/dL (ref 65–99)

## 2015-05-27 MED ORDER — FLUDEOXYGLUCOSE F - 18 (FDG) INJECTION
8.7400 | Freq: Once | INTRAVENOUS | Status: DC | PRN
  Administered 2015-05-27: 8.74 via INTRAVENOUS
  Filled 2015-05-27: qty 8.74

## 2015-05-28 ENCOUNTER — Ambulatory Visit (HOSPITAL_BASED_OUTPATIENT_CLINIC_OR_DEPARTMENT_OTHER): Payer: No Typology Code available for payment source | Admitting: Oncology

## 2015-05-28 ENCOUNTER — Telehealth: Payer: Self-pay | Admitting: Oncology

## 2015-05-28 VITALS — BP 155/63 | HR 63 | Temp 98.2°F | Resp 18 | Ht 65.0 in | Wt 185.7 lb

## 2015-05-28 DIAGNOSIS — R5383 Other fatigue: Secondary | ICD-10-CM | POA: Diagnosis not present

## 2015-05-28 DIAGNOSIS — F411 Generalized anxiety disorder: Secondary | ICD-10-CM

## 2015-05-28 DIAGNOSIS — F329 Major depressive disorder, single episode, unspecified: Secondary | ICD-10-CM

## 2015-05-28 DIAGNOSIS — C50211 Malignant neoplasm of upper-inner quadrant of right female breast: Secondary | ICD-10-CM

## 2015-05-28 DIAGNOSIS — R591 Generalized enlarged lymph nodes: Secondary | ICD-10-CM

## 2015-05-28 NOTE — Telephone Encounter (Signed)
per pof to sch pt appt-gave pt copy of avs °

## 2015-05-28 NOTE — Progress Notes (Signed)
Patient ID: Kelly Chavez, female   DOB: 12-24-1945, 69 y.o.   MRN: [id number]      Rfv: follow up on hiv disease Patient ID: Kelly Chavez, female   DOB: 14-Jul-1946, 69 y.o.   MRN: [id number]  HPI 69 yo F with HIV disease, CD 4 count of 490/VL 31 on truvada/DRVr.she is also following up at the cancer center for breast ca which she had resection, but declined further treatment at this time. She continues to take her hiv medications as directed. We have discussed switching her off of tenofovir to TAF but she is hesistant on something new. Likewise, she is not interested in changing any of her bp medications.  Has occ pain to right arm similar to when she had accidental blood pressure cuffing to right arm at last visit.  Outpatient Encounter Prescriptions as of 05/18/2015  Medication Sig  . atenolol (TENORMIN) 50 MG tablet Take 1 tablet (50 mg total) by mouth daily.  . Darunavir Ethanolate (PREZISTA) 800 MG tablet Take 1 tablet (800 mg total) by mouth daily.  Marland Kitchen emtricitabine-tenofovir (TRUVADA) 200-300 MG per tablet Take 1 tablet by mouth daily.  Marland Kitchen HYDROcodone-acetaminophen (NORCO/VICODIN) 5-325 MG per tablet Take 1-2 tablets by mouth every 4 (four) hours as needed for moderate pain.  Marland Kitchen lidocaine-prilocaine (EMLA) cream APPLY 1 APPLICATION TOPICALLY AS NEEDED.  Marland Kitchen ritonavir (NORVIR) 100 MG TABS tablet Take 1 tablet (100 mg total) by mouth daily.  . [DISCONTINUED] amLODipine (NORVASC) 10 MG tablet Take 10 mg by mouth daily.  . [DISCONTINUED] fluvastatin (LESCOL) 40 MG capsule Take 40 mg by mouth 2 (two) times daily.  . [DISCONTINUED] hydrochlorothiazide (MICROZIDE) 12.5 MG capsule Take 1 capsule (12.5 mg total) by mouth daily.   No facility-administered encounter medications on file as of 05/18/2015.     Patient Active Problem List   Diagnosis Date Noted  . Hyperlipidemia 05/31/2014  . Pedal edema 05/11/2014  . Paranoia 05/11/2014  . Breast cancer of upper-inner quadrant of right female breast  04/15/2014  . History of pituitary tumor 01/28/2014  . Avascular necrosis of bones of both hips 01/28/2014  . Genital herpes 01/28/2014  . Ovarian cyst, bilateral 01/26/2014  . Essential hypertension, benign 01/26/2014  . Unspecified vitamin D deficiency 01/26/2014  . DJD (degenerative joint disease) 01/26/2014  . HIV disease 01/07/2014     Health Maintenance Due  Topic Date Due  . TETANUS/TDAP  10/21/1965  . COLONOSCOPY  10/21/1996  . ZOSTAVAX  10/21/2006  . DEXA SCAN  10/22/2011  . PNA vac Low Risk Adult (2 of 2 - PCV13) 01/23/2015  . INFLUENZA VACCINE  05/17/2015     Review of Systems 10 point ros is negative, no fever, chills, nightsweats, appetite Physical Exam   BP 145/92 mmHg  Pulse 68  Temp(Src) 98.2 F (36.8 C) (Oral)  Wt 187 lb (84.823 kg) Physical Exam  Constitutional:  oriented to person, place, and time. appears well-developed and well-nourished. No distress.  HENT: [place]/AT, PERRLA, no scleral icterus Mouth/Throat: Oropharynx is clear and moist. No oropharyngeal exudate.  Cardiovascular: Normal rate, regular rhythm and normal heart sounds. Exam reveals no gallop and no friction rub.  No murmur heard.  Pulmonary/Chest: Effort normal and breath sounds normal. No respiratory distress.  has no wheezes.  Neck = supple, no nuchal rigidity Abdominal: Soft. Bowel sounds are normal.  exhibits no distension. There is no tenderness.  Lymphadenopathy: no cervical adenopathy. No axillary adenopathy Neurological: alert and oriented to person, place, and time.  Chest = surgical  scar to right breast is well healed Ext = lymphadema to right arm unchanged Skin: Skin is warm and dry. No rash noted. No erythema.  Psychiatric: a normal mood and affect.  behavior is normal.   Lab Results  Component Value Date   CD4TCELL 23* 03/18/2015   Lab Results  Component Value Date   CD4TABS 490 03/18/2015   CD4TABS 560 10/20/2014   CD4TABS 640 01/22/2014   Lab Results  Component  Value Date   HIV1RNAQUANT 31* 03/18/2015   Lab Results  Component Value Date   HEPBSAB NEG 01/22/2014   No results found for: RPR  CBC Lab Results  Component Value Date   WBC 5.0 05/26/2015   RBC 4.12 05/26/2015   HGB 12.2 05/26/2015   HCT 38.7 05/26/2015   PLT 199 05/26/2015   MCV 94.0 05/26/2015   MCH 29.7 05/26/2015   MCHC 31.6 05/26/2015   RDW 14.4 05/26/2015   LYMPHSABS 1.7 05/26/2015   MONOABS 0.4 05/26/2015   EOSABS 0.1 05/26/2015   BASOSABS 0.0 05/26/2015   BMET Lab Results  Component Value Date   NA 141 05/26/2015   K 4.2 05/26/2015   CL 104 10/20/2014   CO2 24 05/26/2015   GLUCOSE 76 05/26/2015   BUN 16.7 05/26/2015   CREATININE 1.0 05/26/2015   CALCIUM 9.3 05/26/2015   GFRNONAA 61 10/20/2014   GFRAA 71 10/20/2014     Assessment and Plan hiv disease = well controlled, continue on current regimen  Hypertension = would like to change her to amlodipine but patient would like to stay on atenolol  Breast cancer = encourage to follow up with next appoint with Dr Nelle Don.  rtc in 3 month, plan for flu vaccination in the Fall

## 2015-05-28 NOTE — Progress Notes (Signed)
Hematology and Oncology Follow Up Visit  Kelly Chavez [id number] Sep 14, 1946 69 y.o. 05/28/2015 3:18 PM Kelly Chavez, MDSnider, Kelly Griffins, MD   Principle Diagnosis: 69 year old woman with stage IIB (T2 N1) right-sided breast cancer. 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   Prior Therapy:  She is status post biopsy on 04/09/2014 which confirmed the presence of invasive carcinoma with axillary lymph node involvement. She is status post right modified radical mastectomy and insertion of a Port-A-Cath done on 06/15/2014. Her final pathology revealed T4bN1a. Her tumor showed lymphovascular invasion with involvement of the nipple but not the nipple and dermal lymphatics. One out of 16 lymph nodes involved.  Current therapy:  She has continued to refuse adjuvant therapy for the time being. She continues on observation and surveillance.  Interim History:  Kelly Chavez presents today for a followup visit. Since her last visit, she reports no complaints. She continues to enjoy a reasonable quality of life and her mobility have returned to baseline. She does have some mild right-sided lymphedema which have been stable. Her appetite have been excellent and is able to drive and attends to activities of daily living without any decline. She still have some fatigue on exertion and has to stop after a period of time and some occasional productive cough in the morning. She is using a walker as well. She is reporting some soreness around the incision site and on her arm but is subsiding clinically. Her mood have also been relatively stable.   She does not report any constitutional symptoms of fevers or chills or sweats. Has not reported any weight loss or appetite changes. She has not reported any headaches or blurry vision or syncope. She has not reported any seizure activity. Not reporting any chest pain shortness of breath  cough or hemoptysis. She she has not reported any nausea or vomiting or abdominal pain. Has not reported any frequency urgency or hesitancy. Has not reported any skeletal complaints. Has not reported any arthralgias or myalgias. She is reporting however a lot of anxiety and depression related to her diagnosis and she is mentally not ready to proceed with any further therapy. Rest of the review of systems unremarkable.    Medications: I have reviewed the patient's current medications.  Current Outpatient Prescriptions  Medication Sig Dispense Refill  . atenolol (TENORMIN) 50 MG tablet Take 1 tablet (50 mg total) by mouth daily. 30 tablet 3  . Darunavir Ethanolate (PREZISTA) 800 MG tablet Take 1 tablet (800 mg total) by mouth daily. 90 tablet 3  . emtricitabine-tenofovir (TRUVADA) 200-300 MG per tablet Take 1 tablet by mouth daily. 90 tablet 3  . HYDROcodone-acetaminophen (NORCO/VICODIN) 5-325 MG per tablet Take 1-2 tablets by mouth every 4 (four) hours as needed for moderate pain. 30 tablet 0  . lidocaine-prilocaine (EMLA) cream APPLY 1 APPLICATION TOPICALLY AS NEEDED. 30 g 0  . ritonavir (NORVIR) 100 MG TABS tablet Take 1 tablet (100 mg total) by mouth daily. 90 tablet 3   No current facility-administered medications for this visit.   Facility-Administered Medications Ordered in Other Visits  Medication Dose Route Frequency Provider Last Rate Last Dose  . fludeoxyglucose F - 18 (FDG) injection 8.74 milli Curie  8.74 milli Curie Intravenous Once PRN Medication Radiologist, MD   8.74 milli Curie at 05/27/15 1200     Allergies: No Known Allergies  Past Medical History, Surgical history, Social history, and Family History  were reviewed and updated.  Physical Exam: Blood pressure 155/63, pulse 63, temperature 98.2 F (36.8 C), temperature source Oral, resp. rate 18, height 5' 5"  (1.651 m), weight 185 lb 11.2 oz (84.233 kg), SpO2 100 %. ECOG: 1 General appearance: alert and cooperative.  Pleasant-appearing woman with invasion mood and not in any distress. Head: Normocephalic, without obvious abnormality Neck: no adenopathy Lymph nodes: Cervical, supraclavicular, and axillary nodes normal. Heart:regular rate and rhythm, S1, S2 normal, no murmur, click, rub or gallop Chest wall examination: Well-healed scar noted. No erythema or induration. Port-A-Cath in place without any complications. Lung: Clear without wheezes, rhonchi or dullness to percussion. Abdomin: soft, non-tender, without masses or organomegaly EXT:no erythema, induration, or nodules. Slight arm edema noted. Skin showed no rashes or lesions.  Lab Results: Lab Results  Component Value Date   WBC 5.0 05/26/2015   HGB 12.2 05/26/2015   HCT 38.7 05/26/2015   MCV 94.0 05/26/2015   PLT 199 05/26/2015     Chemistry      Component Value Date/Time   NA 141 05/26/2015 1307   NA 138 10/20/2014 1803   K 4.2 05/26/2015 1307   K 4.2 10/20/2014 1803   CL 104 10/20/2014 1803   CO2 24 05/26/2015 1307   CO2 24 10/20/2014 1803   BUN 16.7 05/26/2015 1307   BUN 20 10/20/2014 1803   CREATININE 1.0 05/26/2015 1307   CREATININE 0.96 10/20/2014 1803   CREATININE 0.88 06/12/2014 1507      Component Value Date/Time   CALCIUM 9.3 05/26/2015 1307   CALCIUM 9.1 10/20/2014 1803   ALKPHOS 119 05/26/2015 1307   ALKPHOS 114 10/20/2014 1803   AST 22 05/26/2015 1307   AST 24 10/20/2014 1803   ALT 24 05/26/2015 1307   ALT 21 10/20/2014 1803   BILITOT 0.28 05/26/2015 1307   BILITOT 0.3 10/20/2014 1803      EXAM: NUCLEAR MEDICINE PET SKULL BASE TO THIGH  TECHNIQUE: 8.7 mCi F-18 FDG was injected intravenously. Full-ring PET imaging was performed from the skull base to thigh after the radiotracer. CT data was obtained and used for attenuation correction and anatomic localization.  FASTING BLOOD GLUCOSE: Value: 75 mg/dl  COMPARISON: 11/17/2014.  FINDINGS: NECK  No areas of abnormal  hypermetabolism.  CHEST  Status post right-sided mastectomy. Redemonstration of relatively diffuse low-level hypermetabolism about the right chest wall. This measures a S.U.V. max of 2.8, and corresponds to similar interstitial thickening, including medial to surgical clips. On the prior exam, this measured a S.U.V. max of 3.0. No well-defined soft tissue mass identified. No axillary or mediastinal/ hilar nodal hypermetabolism.  Hypermetabolism corresponding to the right lobe of the thyroid. No well-defined dominant nodule identified. This measures a S.U.V. max of 3.9 versus on the prior exam, where it measured a S.U.V. max of 3.4.  ABDOMEN/PELVIS  No abnormal abdominal pelvic nodal hypermetabolism. Note is made of urinary contamination about the perineum.  SKELETON  Hypermetabolism in the right glenohumeral joint which is degenerative.  CT IMAGES PERFORMED FOR ATTENUATION CORRECTION  Right carotid atherosclerosis. No cervical adenopathy. Left-sided Port-A-Cath which terminates at the low SVC. Mild cardiomegaly with proximal LAD coronary artery atherosclerosis. Right axillary node dissection. Scarred right kidney. Small hiatal hernia. Hysterectomy. Mild pelvic floor laxity. Marked joint space narrowing involving the central aspect of both hips with relatively mild osteophyte formation.  IMPRESSION: 1. Status post right mastectomy. Similar low-level hypermetabolism about the right chest wall, likely treatment related. No well-defined hypermetabolism or dominant mass to suggest locally  recurrent disease. 2. No thoracic nodal or distant metastasis. 3. Atherosclerosis, including within the coronary arteries. 4. Advanced joint space narrowing, including about the hips. Most likely related to osteoarthritis. 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 This could be partially causative. 5. Persistent mild hypermetabolism about the right  lobe of the thyroid, without dominant nodule identified. This could either be re-evaluated on follow-up or more entirely characterized with dedicated ultrasound.    Impression and Plan:  69 year old woman with the following issues:  1. Invasive ductal carcinoma of the right breast presented with a 4 cm mass as well as palpable adenopathy. She is status post mastectomy with her tumor found to be T4b.N1a disease with nipple involvement on 06/15/2014.   PET scan on 05/27/2015 was reviewed today and discussed with the patient and her son Ronalee Belts today. It showed showed no evidence of metastatic disease.  The role of chemotherapy was discussed again. She understands that she has high risk malignancy and the standard of care is to administer adjuvant chemotherapy. She understands this very well as she has received systemic chemotherapy adjuvantly for her colon cancer and endometrial cancer. Despite this knowledge, she continues to refuse chemotherapy at this time. She says that she would like to enjoy her life without any interruption moving forward and she would consider chemotherapy in the future if you have developed recurrent disease. She understand that the window of opportunity to cure her cancer would be now and she is okay with that.  She is agreeable to continue active surveillance and repeat imaging studies in 6-8 months.  2. IV access: Port-A-Cath inserted and will be utilized for systemic chemotherapy. This will be flushed every 6 weeks.  3. Adjuvant radiation therapy: She declined that in the past and I'll address that with her again today. I discussed with her the role of radiation to eliminate local recurrence but her rationale for refusing chemotherapy was used again and I will continue to honor her wishes.  4. Anxiety/depression: We will have been stable at this time.  5. Follow-up: Will be in 6 weeks for a Port-A-Cath flush and in 3 months for a clinical visit.  Zola Button,  MD 8/12/20163:18 PM

## 2015-06-21 ENCOUNTER — Other Ambulatory Visit: Payer: Self-pay | Admitting: Internal Medicine

## 2015-06-21 DIAGNOSIS — I10 Essential (primary) hypertension: Secondary | ICD-10-CM

## 2015-07-09 ENCOUNTER — Ambulatory Visit (HOSPITAL_BASED_OUTPATIENT_CLINIC_OR_DEPARTMENT_OTHER): Payer: No Typology Code available for payment source

## 2015-07-09 DIAGNOSIS — Z452 Encounter for adjustment and management of vascular access device: Secondary | ICD-10-CM | POA: Diagnosis not present

## 2015-07-09 DIAGNOSIS — C50211 Malignant neoplasm of upper-inner quadrant of right female breast: Secondary | ICD-10-CM | POA: Diagnosis not present

## 2015-07-09 DIAGNOSIS — Z95828 Presence of other vascular implants and grafts: Secondary | ICD-10-CM

## 2015-07-09 MED ORDER — SODIUM CHLORIDE 0.9 % IJ SOLN
10.0000 mL | INTRAMUSCULAR | Status: DC | PRN
  Administered 2015-07-09: 10 mL via INTRAVENOUS
  Filled 2015-07-09: qty 10

## 2015-07-09 MED ORDER — HEPARIN SOD (PORK) LOCK FLUSH 100 UNIT/ML IV SOLN
500.0000 [IU] | Freq: Once | INTRAVENOUS | Status: AC
Start: 2015-07-09 — End: 2015-07-09
  Administered 2015-07-09: 500 [IU] via INTRAVENOUS
  Filled 2015-07-09: qty 5

## 2015-07-09 NOTE — Patient Instructions (Signed)

## 2015-07-29 ENCOUNTER — Telehealth: Payer: Self-pay | Admitting: Oncology

## 2015-07-29 NOTE — Telephone Encounter (Signed)
[facility] - moved 11/15 appointments to 11/10. Spoke with patient and per patient moved appointments from 11/10 to 11/30. Patient has new date/time.

## 2015-08-17 ENCOUNTER — Encounter: Payer: Self-pay | Admitting: Internal Medicine

## 2015-08-17 ENCOUNTER — Ambulatory Visit (INDEPENDENT_AMBULATORY_CARE_PROVIDER_SITE_OTHER): Payer: No Typology Code available for payment source | Admitting: Internal Medicine

## 2015-08-17 ENCOUNTER — Other Ambulatory Visit: Payer: Self-pay | Admitting: *Deleted

## 2015-08-17 VITALS — BP 150/82 | HR 73 | Temp 98.4°F | Wt 189.0 lb

## 2015-08-17 DIAGNOSIS — B20 Human immunodeficiency virus [HIV] disease: Secondary | ICD-10-CM | POA: Diagnosis not present

## 2015-08-17 DIAGNOSIS — Z23 Encounter for immunization: Secondary | ICD-10-CM

## 2015-08-17 DIAGNOSIS — R196 Halitosis: Secondary | ICD-10-CM

## 2015-08-17 DIAGNOSIS — Z113 Encounter for screening for infections with a predominantly sexual mode of transmission: Secondary | ICD-10-CM | POA: Diagnosis not present

## 2015-08-17 DIAGNOSIS — I10 Essential (primary) hypertension: Secondary | ICD-10-CM | POA: Diagnosis not present

## 2015-08-17 DIAGNOSIS — F22 Delusional disorders: Secondary | ICD-10-CM

## 2015-08-17 LAB — CBC WITH DIFFERENTIAL/PLATELET
BASOS ABS: 0.1 10*3/uL (ref 0.0–0.1)
BASOS PCT: 1 % (ref 0–1)
EOS ABS: 0.1 10*3/uL (ref 0.0–0.7)
EOS PCT: 1 % (ref 0–5)
HCT: 38.1 % (ref 36.0–46.0)
Hemoglobin: 12.9 g/dL (ref 12.0–15.0)
LYMPHS PCT: 35 % (ref 12–46)
Lymphs Abs: 1.9 10*3/uL (ref 0.7–4.0)
MCH: 30.9 pg (ref 26.0–34.0)
MCHC: 33.9 g/dL (ref 30.0–36.0)
MCV: 91.1 fL (ref 78.0–100.0)
MONO ABS: 0.4 10*3/uL (ref 0.1–1.0)
MPV: 11.7 fL (ref 8.6–12.4)
Monocytes Relative: 7 % (ref 3–12)
Neutro Abs: 3 10*3/uL (ref 1.7–7.7)
Neutrophils Relative %: 56 % (ref 43–77)
PLATELETS: 219 10*3/uL (ref 150–400)
RBC: 4.18 MIL/uL (ref 3.87–5.11)
RDW: 14.3 % (ref 11.5–15.5)
WBC: 5.4 10*3/uL (ref 4.0–10.5)

## 2015-08-17 LAB — COMPLETE METABOLIC PANEL WITH GFR
ALT: 19 U/L (ref 6–29)
AST: 22 U/L (ref 10–35)
Albumin: 3.9 g/dL (ref 3.6–5.1)
Alkaline Phosphatase: 122 U/L (ref 33–130)
BUN: 19 mg/dL (ref 7–25)
CHLORIDE: 106 mmol/L (ref 98–110)
CO2: 22 mmol/L (ref 20–31)
CREATININE: 1.08 mg/dL — AB (ref 0.50–0.99)
Calcium: 9.4 mg/dL (ref 8.6–10.4)
GFR, Est African American: 61 mL/min (ref >=60)
GFR, Est Non African American: 53 mL/min — ABNORMAL LOW (ref >=60)
Glucose, Bld: 91 mg/dL (ref 65–99)
POTASSIUM: 5.1 mmol/L (ref 3.5–5.3)
SODIUM: 140 mmol/L (ref 135–146)
Total Bilirubin: 0.2 mg/dL (ref 0.2–1.2)
Total Protein: 7.5 g/dL (ref 6.1–8.1)

## 2015-08-17 LAB — RPR

## 2015-08-17 MED ORDER — CHLORHEXIDINE GLUCONATE 0.12% ORAL RINSE (MEDLINE KIT): 15.0000 mL | Freq: Two times a day (BID) | OROMUCOSAL | Status: DC

## 2015-08-17 NOTE — Progress Notes (Signed)
Patient ID: [name], female   DOB: January 01, 1946, 69 y.o.   MRN: [id number]       Patient ID: Jolanda Mccann, female   DOB: Dec 05, 1945, 69 y.o.   MRN: [id number]  HPI  69yo F with HIV disease, Cd 4 count 490/VL 31. On truvada-DRVr regimen. Well controlled. She also has hx of right breast Ca s/p mastectomy but has declined having chemotherapy. She states that she is doing ok, still occasionally feeling like someone is watching her? Though not having any hallucinations. She is still resistant to meeting with psychiatry for further evaluation of her symptoms. She reports that her hallitosis persists and needs help with referral to dentistry  Outpatient Encounter Prescriptions as of 08/17/2015  Medication Sig  . atenolol (TENORMIN) 50 MG tablet TAKE 1 TABLET BY MOUTH EVERY DAY  . Darunavir Ethanolate (PREZISTA) 800 MG tablet Take 1 tablet (800 mg total) by mouth daily.  Marland Kitchen emtricitabine-tenofovir (TRUVADA) 200-300 MG per tablet Take 1 tablet by mouth daily.  Marland Kitchen lidocaine-prilocaine (EMLA) cream APPLY 1 APPLICATION TOPICALLY AS NEEDED.  Marland Kitchen ritonavir (NORVIR) 100 MG TABS tablet Take 1 tablet (100 mg total) by mouth daily.  Marland Kitchen HYDROcodone-acetaminophen (NORCO/VICODIN) 5-325 MG per tablet Take 1-2 tablets by mouth every 4 (four) hours as needed for moderate pain. (Patient not taking: Reported on 08/17/2015)   No facility-administered encounter medications on file as of 08/17/2015.     Patient Active Problem List   Diagnosis Date Noted  . Hyperlipidemia 05/31/2014  . Pedal edema 05/11/2014  . Paranoia ([place]) 05/11/2014  . Breast cancer of upper-inner quadrant of right female breast ([place]) 04/15/2014  . History of pituitary tumor 01/28/2014  . Avascular necrosis of bones of both hips ([place]) 01/28/2014  . Genital herpes 01/28/2014  . Ovarian cyst, bilateral 01/26/2014  . Essential hypertension, benign 01/26/2014  . Unspecified vitamin D deficiency 01/26/2014  . DJD (degenerative joint disease) 01/26/2014    . HIV disease ([place]) 01/07/2014     Health Maintenance Due  Topic Date Due  . TETANUS/TDAP  10/21/1965  . COLONOSCOPY  10/21/1996  . ZOSTAVAX  10/21/2006  . DEXA SCAN  10/22/2011  . PNA vac Low Risk Adult (2 of 2 - PCV13) 01/23/2015  . INFLUENZA VACCINE  05/17/2015     Review of Systems 10 point ros is negative except what is mentioned above. Physical Exam   BP 150/82 mmHg  Pulse 73  Temp(Src) 98.4 F (36.9 C) (Oral)  Wt 189 lb (85.73 kg) Physical Exam  Constitutional:  oriented to person, place, and time. appears well-developed and well-nourished. No distress.  HENT: [place]/AT, PERRLA, no scleral icterus Mouth/Throat: Oropharynx is clear and moist. No oropharyngeal exudate. Poor dentition. hallitosis Cardiovascular: Normal rate, regular rhythm and normal heart sounds. Exam reveals no gallop and no friction rub.  No murmur heard.  Pulmonary/Chest: Effort normal and breath sounds normal. No respiratory distress.  has no wheezes.  Neck = supple, no nuchal rigidity Lymphadenopathy: no cervical adenopathy. No axillary adenopathy   Lab Results  Component Value Date   CD4TCELL 23* 03/18/2015   Lab Results  Component Value Date   CD4TABS 490 03/18/2015   CD4TABS 560 10/20/2014   CD4TABS 640 01/22/2014   Lab Results  Component Value Date   HIV1RNAQUANT 31* 03/18/2015   Lab Results  Component Value Date   HEPBSAB NEG 01/22/2014   No results found for: RPR  CBC Lab Results  Component Value Date   WBC 5.0 05/26/2015   RBC 4.12 05/26/2015  HGB 12.2 05/26/2015   HCT 38.7 05/26/2015   PLT 199 05/26/2015   MCV 94.0 05/26/2015   MCH 29.7 05/26/2015   MCHC 31.6 05/26/2015   RDW 14.4 05/26/2015   LYMPHSABS 1.7 05/26/2015   MONOABS 0.4 05/26/2015   EOSABS 0.1 05/26/2015   BASOSABS 0.0 05/26/2015   BMET Lab Results  Component Value Date   NA 141 05/26/2015   K 4.2 05/26/2015   CL 104 10/20/2014   CO2 24 05/26/2015   GLUCOSE 76 05/26/2015   BUN 16.7 05/26/2015    CREATININE 1.0 05/26/2015   CALCIUM 9.3 05/26/2015   GFRNONAA 61 10/20/2014   GFRAA 71 10/20/2014     Assessment and Plan  hiv disease = introduced the idea to switch to newer regimen of TAF based regimen plus cDRV which she is contemplating but not ready to switch  HTN = not well controlled, patient again reluctant to change her home regimen  Paranoia = getting closer to accepting referral to psychiatry  hallitosis =gave peridex and will need to refer to dentistry    Will refer to amber to see if can do further outreach and coordination of care

## 2015-08-18 LAB — T-HELPER CELL (CD4) - (RCID CLINIC ONLY)
CD4 % Helper T Cell: 26 % — ABNORMAL LOW (ref 33–55)
CD4 T Cell Abs: 500 /uL (ref 400–2700)

## 2015-08-19 LAB — HIV-1 RNA QUANT-NO REFLEX-BLD: HIV 1 RNA Quant: <20 copies/mL (ref <20)

## 2015-08-24 ENCOUNTER — Telehealth: Payer: Self-pay | Admitting: *Deleted

## 2015-08-24 NOTE — Telephone Encounter (Signed)
RN received referral from Dr Baxter Flattery stating the patient may need a little assistance with adhering to her regimen and stating in care. RN reviewed the patient chart prior to contacting the patient. RN called the patient and left a message stating my name, reason for my call and asked the patient to return my call. Cell number left for the patient. RN will f/u if I do not hear from the patient soon

## 2015-08-26 ENCOUNTER — Ambulatory Visit: Payer: No Typology Code available for payment source | Admitting: Oncology

## 2015-08-26 ENCOUNTER — Other Ambulatory Visit: Payer: No Typology Code available for payment source

## 2015-08-31 ENCOUNTER — Other Ambulatory Visit: Payer: No Typology Code available for payment source

## 2015-08-31 ENCOUNTER — Ambulatory Visit: Payer: No Typology Code available for payment source | Admitting: Oncology

## 2015-09-13 ENCOUNTER — Inpatient Hospital Stay (HOSPITAL_COMMUNITY): Payer: Medicare Other

## 2015-09-13 ENCOUNTER — Inpatient Hospital Stay (HOSPITAL_BASED_OUTPATIENT_CLINIC_OR_DEPARTMENT_OTHER)
Admission: EM | Admit: 2015-09-13 | Discharge: 2015-09-15 | DRG: 064 | Disposition: A | Discharge status: Home Health | Payer: Medicare Other | Attending: Internal Medicine | Admitting: Internal Medicine

## 2015-09-13 ENCOUNTER — Emergency Department (HOSPITAL_BASED_OUTPATIENT_CLINIC_OR_DEPARTMENT_OTHER): Payer: Medicare Other

## 2015-09-13 ENCOUNTER — Encounter (HOSPITAL_BASED_OUTPATIENT_CLINIC_OR_DEPARTMENT_OTHER): Payer: Self-pay | Admitting: Emergency Medicine

## 2015-09-13 DIAGNOSIS — Z833 Family history of diabetes mellitus: Secondary | ICD-10-CM

## 2015-09-13 DIAGNOSIS — I672 Cerebral atherosclerosis: Secondary | ICD-10-CM | POA: Diagnosis present

## 2015-09-13 DIAGNOSIS — R402362 Coma scale, best motor response, obeys commands, at arrival to emergency department: Secondary | ICD-10-CM | POA: Diagnosis present

## 2015-09-13 DIAGNOSIS — I639 Cerebral infarction, unspecified: Secondary | ICD-10-CM | POA: Diagnosis not present

## 2015-09-13 DIAGNOSIS — I6302 Cerebral infarction due to thrombosis of basilar artery: Secondary | ICD-10-CM | POA: Diagnosis not present

## 2015-09-13 DIAGNOSIS — R2990 Unspecified symptoms and signs involving the nervous system: Secondary | ICD-10-CM

## 2015-09-13 DIAGNOSIS — I6350 Cerebral infarction due to unspecified occlusion or stenosis of unspecified cerebral artery: Secondary | ICD-10-CM | POA: Diagnosis not present

## 2015-09-13 DIAGNOSIS — Z8249 Family history of ischemic heart disease and other diseases of the circulatory system: Secondary | ICD-10-CM | POA: Diagnosis not present

## 2015-09-13 DIAGNOSIS — I119 Hypertensive heart disease without heart failure: Secondary | ICD-10-CM | POA: Diagnosis not present

## 2015-09-13 DIAGNOSIS — F22 Delusional disorders: Secondary | ICD-10-CM | POA: Diagnosis present

## 2015-09-13 DIAGNOSIS — E669 Obesity, unspecified: Secondary | ICD-10-CM | POA: Diagnosis present

## 2015-09-13 DIAGNOSIS — R402142 Coma scale, eyes open, spontaneous, at arrival to emergency department: Secondary | ICD-10-CM | POA: Diagnosis present

## 2015-09-13 DIAGNOSIS — Z90710 Acquired absence of both cervix and uterus: Secondary | ICD-10-CM | POA: Diagnosis not present

## 2015-09-13 DIAGNOSIS — R4781 Slurred speech: Secondary | ICD-10-CM | POA: Diagnosis present

## 2015-09-13 DIAGNOSIS — R402252 Coma scale, best verbal response, oriented, at arrival to emergency department: Secondary | ICD-10-CM | POA: Diagnosis present

## 2015-09-13 DIAGNOSIS — Z9221 Personal history of antineoplastic chemotherapy: Secondary | ICD-10-CM | POA: Diagnosis not present

## 2015-09-13 DIAGNOSIS — Z85038 Personal history of other malignant neoplasm of large intestine: Secondary | ICD-10-CM | POA: Diagnosis not present

## 2015-09-13 DIAGNOSIS — G8324 Monoplegia of upper limb affecting left nondominant side: Secondary | ICD-10-CM | POA: Diagnosis present

## 2015-09-13 DIAGNOSIS — R29810 Facial weakness: Secondary | ICD-10-CM | POA: Diagnosis present

## 2015-09-13 DIAGNOSIS — Z9049 Acquired absence of other specified parts of digestive tract: Secondary | ICD-10-CM

## 2015-09-13 DIAGNOSIS — F419 Anxiety disorder, unspecified: Secondary | ICD-10-CM | POA: Diagnosis present

## 2015-09-13 DIAGNOSIS — Z9011 Acquired absence of right breast and nipple: Secondary | ICD-10-CM | POA: Diagnosis not present

## 2015-09-13 DIAGNOSIS — B20 Human immunodeficiency virus [HIV] disease: Secondary | ICD-10-CM | POA: Diagnosis present

## 2015-09-13 DIAGNOSIS — D649 Anemia, unspecified: Secondary | ICD-10-CM | POA: Diagnosis present

## 2015-09-13 DIAGNOSIS — Z6830 Body mass index (BMI) 30.0-30.9, adult: Secondary | ICD-10-CM | POA: Diagnosis not present

## 2015-09-13 DIAGNOSIS — E785 Hyperlipidemia, unspecified: Secondary | ICD-10-CM | POA: Diagnosis present

## 2015-09-13 DIAGNOSIS — R4182 Altered mental status, unspecified: Secondary | ICD-10-CM

## 2015-09-13 DIAGNOSIS — R404 Transient alteration of awareness: Secondary | ICD-10-CM | POA: Diagnosis not present

## 2015-09-13 DIAGNOSIS — I6789 Other cerebrovascular disease: Secondary | ICD-10-CM | POA: Diagnosis not present

## 2015-09-13 DIAGNOSIS — Z853 Personal history of malignant neoplasm of breast: Secondary | ICD-10-CM | POA: Diagnosis not present

## 2015-09-13 LAB — COMPREHENSIVE METABOLIC PANEL
ALBUMIN: 3.6 g/dL (ref 3.5–5.0)
ALK PHOS: 89 U/L (ref 38–126)
ALT: 22 U/L (ref 14–54)
ANION GAP: 7 (ref 5–15)
AST: 30 U/L (ref 15–41)
BILIRUBIN TOTAL: 0.5 mg/dL (ref 0.3–1.2)
BUN: 19 mg/dL (ref 6–20)
CALCIUM: 8.7 mg/dL — AB (ref 8.9–10.3)
CO2: 25 mmol/L (ref 22–32)
CREATININE: 0.88 mg/dL (ref 0.44–1.00)
Chloride: 107 mmol/L (ref 101–111)
GFR calc Af Amer: >60 mL/min (ref >60)
GFR calc non Af Amer: >60 mL/min (ref >60)
GLUCOSE: 91 mg/dL (ref 65–99)
Potassium: 3.6 mmol/L (ref 3.5–5.1)
Sodium: 139 mmol/L (ref 135–145)
TOTAL PROTEIN: 7.1 g/dL (ref 6.5–8.1)

## 2015-09-13 LAB — RAPID URINE DRUG SCREEN, HOSP PERFORMED
Amphetamines: NOT DETECTED
BARBITURATES: NOT DETECTED
BENZODIAZEPINES: NOT DETECTED
Cocaine: NOT DETECTED
Opiates: NOT DETECTED
Tetrahydrocannabinol: NOT DETECTED

## 2015-09-13 LAB — PROTIME-INR
INR: 1.19 (ref 0.00–1.49)
Prothrombin Time: 15.3 seconds — ABNORMAL HIGH (ref 11.6–15.2)

## 2015-09-13 LAB — DIFFERENTIAL
Basophils Absolute: 0 10*3/uL (ref 0.0–0.1)
Basophils Relative: 0 %
EOS PCT: 1 %
Eosinophils Absolute: 0 10*3/uL (ref 0.0–0.7)
LYMPHS ABS: 1.8 10*3/uL (ref 0.7–4.0)
LYMPHS PCT: 34 %
Monocytes Absolute: 0.6 10*3/uL (ref 0.1–1.0)
Monocytes Relative: 11 %
NEUTROS ABS: 3 10*3/uL (ref 1.7–7.7)
NEUTROS PCT: 54 %

## 2015-09-13 LAB — URINALYSIS, ROUTINE W REFLEX MICROSCOPIC
Bilirubin Urine: NEGATIVE
Glucose, UA: NEGATIVE mg/dL
Hgb urine dipstick: NEGATIVE
Ketones, ur: 15 mg/dL — AB
LEUKOCYTES UA: NEGATIVE
NITRITE: NEGATIVE
PROTEIN: NEGATIVE mg/dL
SPECIFIC GRAVITY, URINE: 1.023 (ref 1.005–1.030)
pH: 6 (ref 5.0–8.0)

## 2015-09-13 LAB — CBC
HCT: 38.2 % (ref 36.0–46.0)
HEMOGLOBIN: 11.8 g/dL — AB (ref 12.0–15.0)
MCH: 30 pg (ref 26.0–34.0)
MCHC: 30.9 g/dL (ref 30.0–36.0)
MCV: 97.2 fL (ref 78.0–100.0)
Platelets: 211 10*3/uL (ref 150–400)
RBC: 3.93 MIL/uL (ref 3.87–5.11)
RDW: 13.6 % (ref 11.5–15.5)
WBC: 5.5 10*3/uL (ref 4.0–10.5)

## 2015-09-13 LAB — ETHANOL: Alcohol, Ethyl (B): <5 mg/dL (ref <5)

## 2015-09-13 LAB — TROPONIN I

## 2015-09-13 LAB — CBG MONITORING, ED: GLUCOSE-CAPILLARY: 90 mg/dL (ref 65–99)

## 2015-09-13 LAB — APTT: aPTT: 154 seconds — ABNORMAL HIGH (ref 24–37)

## 2015-09-13 MED ORDER — RITONAVIR 100 MG PO TABS
100.0000 mg | ORAL_TABLET | Freq: Every day | ORAL | Status: DC
  Administered 2015-09-14 – 2015-09-15 (×2): 100 mg via ORAL
  Filled 2015-09-13 (×2): qty 1

## 2015-09-13 MED ORDER — ATENOLOL 25 MG PO TABS
50.0000 mg | ORAL_TABLET | Freq: Every day | ORAL | Status: DC
  Administered 2015-09-14 – 2015-09-15 (×2): 50 mg via ORAL
  Filled 2015-09-13 (×2): qty 2

## 2015-09-13 MED ORDER — EMTRICITABINE-TENOFOVIR AF 200-25 MG PO TABS
1.0000 | ORAL_TABLET | Freq: Every day | ORAL | Status: DC
  Administered 2015-09-14 – 2015-09-15 (×2): 1 via ORAL
  Filled 2015-09-13 (×2): qty 1

## 2015-09-13 MED ORDER — DARUNAVIR ETHANOLATE 800 MG PO TABS
800.0000 mg | ORAL_TABLET | Freq: Every day | ORAL | Status: DC
  Administered 2015-09-14 – 2015-09-15 (×2): 800 mg via ORAL
  Filled 2015-09-13 (×3): qty 1

## 2015-09-13 MED ORDER — HEPARIN SODIUM (PORCINE) 5000 UNIT/ML IJ SOLN
5000.0000 [IU] | Freq: Three times a day (TID) | INTRAMUSCULAR | Status: DC
  Administered 2015-09-13 – 2015-09-15 (×6): 5000 [IU] via SUBCUTANEOUS
  Filled 2015-09-13 (×4): qty 1

## 2015-09-13 MED ORDER — STROKE: EARLY STAGES OF RECOVERY BOOK
Freq: Once | Status: AC
  Administered 2015-09-14: 17:00:00

## 2015-09-13 MED ORDER — CHLORHEXIDINE GLUCONATE 0.12% ORAL RINSE (MEDLINE KIT)
15.0000 mL | Freq: Two times a day (BID) | OROMUCOSAL | Status: DC
  Administered 2015-09-14 (×2): 15 mL via OROMUCOSAL

## 2015-09-13 MED ORDER — LIDOCAINE-EPINEPHRINE 1 %-1:100000 IJ SOLN
20.0000 mL | Freq: Once | INTRAMUSCULAR | Status: DC
  Filled 2015-09-13: qty 20

## 2015-09-13 NOTE — Consult Note (Addendum)
Referring Physician: ED at [facility]     Chief Complaint: dysarthria, left face weakness  HPI:                                                                                                                                         Teneisha Gignac is an 69 y.o. 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 Her son is at the bedside and said that yesterday he noticed that she was slurring her words, and then her left face became droopy. He got concerned because she did not improve and decided to take her to the ED at [facility]. She denies associated HA, vertigo, focal weakness or numbness, double vision, difficulty swallowing, langauge or visual impairment. CT brain showed no acute abnormality. MRI/MRA brain were personally reviewed and demonstrated an acute left pontine infarct and moderate to marked narrowing mid aspect basilar artery respectively.    Date last known well: 09/12/15 Time last known well: unable to determine tPA Given: no, out of the window   Past Medical History  Diagnosis Date  . 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 Cancer of sigmoid colon [facility]) 2011    sigmoid colectomy   . Status post chemotherapy 2012  . Brain tumor (benign) ([place])   . Avascular necrosis of bones of both hips ([place])   . Invasive ductal carcinoma of right breast ([place]) 04/09/14  . HIV (human immunodeficiency virus infection) ([place])   . Hypertension   . Anxiety   . Complication of anesthesia     senitive to all    Past Surgical History  Procedure Laterality Date  . Tonsillectomy    . Total abdominal hysterectomy w/ bilateral salpingoophorectomy  2012  . Colon surgery      partial colectomy  . Mastectomy modified radical Right 06/15/2014     Procedure: RIGHT MODIFIED RADICAL MASTECTOMY;  Surgeon: Shann Medal, MD;  Location: [place];  Service: General;  Laterality: Right;  . Portacath placement N/A 06/15/2014    Procedure: INSERTION PORT-A-CATH LEFT SUBCLAVIAN;  Surgeon: Shann Medal, MD;  Location: [place];  Service: General;  Laterality: N/A;  . Abdominal hysterectomy      Family History  Problem Relation Age of Onset  . Diabetes Father   . Hyperlipidemia Father   . Hypertension Mother   . Hyperlipidemia Mother   . Dementia Mother   . Diabetes Sister   . Diabetes Brother    Social History:  reports that she has never smoked. She has never used smokeless tobacco. She reports that she does not drink alcohol or use illicit drugs. Family history: no epilepsy, MS, or brain tumor Allergies: No Known Allergies  Medications:                                                                                                                           Scheduled: .  stroke: mapping our early stages of recovery book   Does not apply Once  . [START ON 09/14/2015] atenolol  50 mg Oral Daily  . chlorhexidine gluconate  15 mL Mouth/Throat BID  . [START ON 09/14/2015] Darunavir Ethanolate  800 mg Oral Q breakfast  . [START ON 09/14/2015] emtricitabine-tenofovir AF  1 tablet Oral Daily  . heparin  5,000 Units Subcutaneous 3 times per day  . lidocaine-EPINEPHrine  20 mL Intradermal Once  . [START ON 09/14/2015] ritonavir  100 mg Oral Q breakfast    ROS:                                                                                                                                       History obtained from chart review and the patient  General ROS: negative for - chills, fatigue, fever, night sweats, weight gain or weight loss Psychological ROS: negative for - behavioral disorder, hallucinations, memory difficulties, mood swings or suicidal ideation Ophthalmic ROS: negative for - blurry vision, double vision, eye pain or loss of  vision ENT ROS: negative for - epistaxis, nasal discharge, oral lesions, sore throat, tinnitus or vertigo Allergy and Immunology ROS: negative for - hives or itchy/watery eyes Hematological and Lymphatic ROS: negative for - bleeding problems, bruising or swollen lymph nodes Endocrine ROS: negative for - galactorrhea, hair pattern changes, polydipsia/polyuria or temperature intolerance Respiratory ROS: negative for - cough, hemoptysis, shortness of breath or wheezing Cardiovascular ROS: negative for - chest pain, dyspnea on exertion, edema or irregular heartbeat Gastrointestinal ROS: negative for - abdominal pain, diarrhea, hematemesis, nausea/vomiting or stool incontinence Genito-Urinary ROS: negative for - dysuria, hematuria, incontinence or urinary frequency/urgency Musculoskeletal ROS: negative for - joint swelling or muscular weakness Neurological ROS: as noted in HPI Dermatological ROS: negative for rash and skin lesion changes  Physical exam:  Constitutional: well developed, pleasant female in no apparent distress. Blood pressure 162/72, pulse 57, temperature 98.5 F (36.9 C), temperature source Oral, resp. rate 18, height _0  (1.651 m), weight 81.9 kg (180 lb 8.9 oz), SpO2 98 %. Eyes: no jaundice or exophthalmos.  Head: normocephalic. Neck: supple, no bruits, no JVD. Cardiac: no murmurs. Lungs: clear.  Abdomen: soft, no tender, no mass. Extremities: no edema, clubbing, or cyanosis.  Skin: no rash  Neurologic Examination:                                                                                                      General: NAD Mental Status: Alert, oriented, thought content appropriate.  Speech fluent without evidence of aphasia.  Able to follow 3 step commands without difficulty. Cranial Nerves: II: Discs flat bilaterally; Visual fields grossly normal, pupils equal, round, reactive to light and accommodation III,IV, VI: ptosis not present, extra-ocular motions intact  bilaterally V,VII: smile symmetric, facial light touch sensation normal bilaterally VIII: hearing normal bilaterally IX,X: uvula rises symmetrically XI: bilateral shoulder shrug XII: midline tongue extension without atrophy or fasciculations  Motor: Right : Upper extremity   5/5    Left:     Upper extremity   5/5  Lower extremity   5/5     Lower extremity   5/5 Tone and bulk:normal tone throughout; no atrophy noted Sensory: Pinprick and light touch intact throughout, bilaterally Deep Tendon Reflexes:  Right: Upper Extremity   Left: Upper extremity   biceps (C-5 to C-6) 2/4   biceps (C-5 to C-6) 2/4 tricep (C7) 2/4    triceps (C7) 2/4 Brachioradialis (C6) 2/4  Brachioradialis (C6) 2/4  Lower Extremity Lower Extremity  quadriceps (L-2 to L-4) 2/4   quadriceps (L-2 to L-4) 2/4 Achilles (S1) 2/4   Achilles (S1) 2/4  Plantars: Right: downgoing   Left: downgoing Cerebellar: normal finger-to-nose,  normal heel-to-shin test Gait: No tested due to multiple leads    Results for orders placed or performed during the hospital encounter of 09/13/15 (from the past 48 hour(s))  Ethanol     Status: None   Collection Time: 09/13/15  1:45 PM  Result Value Ref Range   Alcohol, Ethyl (B) <5 <5 mg/dL    Comment:        LOWEST DETECTABLE LIMIT FOR SERUM ALCOHOL IS 5 mg/dL FOR MEDICAL PURPOSES ONLY   Protime-INR     Status: Abnormal   Collection Time: 09/13/15  1:45 PM  Result Value Ref Range   Prothrombin Time 15.3 (H) 11.6 - 15.2 seconds   INR 1.19 0.00 - 1.49  APTT     Status: Abnormal   Collection Time: 09/13/15  1:45 PM  Result Value Ref Range   aPTT 154 (H) 24 - 37 seconds    Comment:        IF BASELINE aPTT IS ELEVATED, SUGGEST PATIENT RISK ASSESSMENT BE USED TO DETERMINE APPROPRIATE ANTICOAGULANT THERAPY.   CBC     Status: Abnormal   Collection Time: 09/13/15  1:45 PM  Result Value Ref Range   WBC 5.5 4.0 - 10.5 K/uL   RBC 3.93 3.87 - 5.11 MIL/uL   Hemoglobin 11.8 (L)  12.0 - 15.0 g/dL   HCT 38.2 36.0 - 46.0 %   MCV 97.2 78.0 - 100.0 fL   MCH 30.0 26.0 - 34.0 pg   MCHC 30.9 30.0 - 36.0 g/dL   RDW 13.6 11.5 -  15.5 %   Platelets 211 150 - 400 K/uL  Differential     Status: None   Collection Time: 09/13/15  1:45 PM  Result Value Ref Range   Neutrophils Relative % 54 %   Neutro Abs 3.0 1.7 - 7.7 K/uL   Lymphocytes Relative 34 %   Lymphs Abs 1.8 0.7 - 4.0 K/uL   Monocytes Relative 11 %   Monocytes Absolute 0.6 0.1 - 1.0 K/uL   Eosinophils Relative 1 %   Eosinophils Absolute 0.0 0.0 - 0.7 K/uL   Basophils Relative 0 %   Basophils Absolute 0.0 0.0 - 0.1 K/uL  Comprehensive metabolic panel     Status: Abnormal   Collection Time: 09/13/15  1:45 PM  Result Value Ref Range   Sodium 139 135 - 145 mmol/L   Potassium 3.6 3.5 - 5.1 mmol/L   Chloride 107 101 - 111 mmol/L   CO2 25 22 - 32 mmol/L   Glucose, Bld 91 65 - 99 mg/dL   BUN 19 6 - 20 mg/dL   Creatinine, Ser 0.88 0.44 - 1.00 mg/dL   Calcium 8.7 (L) 8.9 - 10.3 mg/dL   Total Protein 7.1 6.5 - 8.1 g/dL   Albumin 3.6 3.5 - 5.0 g/dL   AST 30 15 - 41 U/L   ALT 22 14 - 54 U/L   Alkaline Phosphatase 89 38 - 126 U/L   Total Bilirubin 0.5 0.3 - 1.2 mg/dL   GFR calc non Af Amer >60 >60 mL/min   GFR calc Af Amer >60 >60 mL/min    Comment: (NOTE) The eGFR has been calculated using the CKD EPI equation. This calculation has not been validated in all clinical situations. eGFR's persistently <60 mL/min signify possible Chronic Kidney Disease.    Anion gap 7 5 - 15  Troponin I     Status: None   Collection Time: 09/13/15  1:45 PM  Result Value Ref Range   Troponin I <0.03 <0.031 ng/mL    Comment:        NO INDICATION OF MYOCARDIAL INJURY.   CBG monitoring, ED     Status: None   Collection Time: 09/13/15  1:55 PM  Result Value Ref Range   Glucose-Capillary 90 65 - 99 mg/dL  Urine rapid drug screen (hosp performed)not at [facility]     Status: None   Collection Time: 09/13/15  4:00 PM  Result Value Ref  Range   Opiates NONE DETECTED NONE DETECTED   Cocaine NONE DETECTED NONE DETECTED   Benzodiazepines NONE DETECTED NONE DETECTED   Amphetamines NONE DETECTED NONE DETECTED   Tetrahydrocannabinol NONE DETECTED NONE DETECTED   Barbiturates NONE DETECTED NONE DETECTED    Comment:        DRUG SCREEN FOR MEDICAL PURPOSES ONLY.  IF CONFIRMATION IS NEEDED FOR ANY PURPOSE, NOTIFY LAB WITHIN 5 DAYS.        LOWEST DETECTABLE LIMITS FOR URINE DRUG SCREEN Drug Class       Cutoff (ng/mL) Amphetamine      1000 Barbiturate      200 Benzodiazepine   449 Tricyclics       675 Opiates          300 Cocaine          300 THC              50   Urinalysis, Routine w reflex microscopic (not at [facility])     Status: Abnormal   Collection Time: 09/13/15  4:00 PM  Result Value Ref Range   Color, Urine YELLOW YELLOW   APPearance CLEAR CLEAR   Specific Gravity, Urine 1.023 1.005 - 1.030   pH 6.0 5.0 - 8.0   Glucose, UA NEGATIVE NEGATIVE mg/dL   Hgb urine dipstick NEGATIVE NEGATIVE   Bilirubin Urine NEGATIVE NEGATIVE   Ketones, ur 15 (A) NEGATIVE mg/dL   Protein, ur NEGATIVE NEGATIVE mg/dL   Nitrite NEGATIVE NEGATIVE   Leukocytes, UA NEGATIVE NEGATIVE    Comment: MICROSCOPIC NOT DONE ON URINES WITH NEGATIVE PROTEIN, BLOOD, LEUKOCYTES, NITRITE, OR GLUCOSE <1000 mg/dL.   Ct Head Wo Contrast  09/13/2015  CLINICAL DATA:  Altered mental status.  Confusion.  Slurred speech. EXAM: CT HEAD WITHOUT CONTRAST TECHNIQUE: Contiguous axial images were obtained from the base of the skull through the vertex without intravenous contrast. COMPARISON:  None. FINDINGS: Diffusely enlarged ventricles and subarachnoid spaces. Patchy white matter low density in both cerebral hemispheres, midbrain and pons. No intracranial hemorrhage, mass lesion or CT evidence of acute infarction. Right sphenoid sinus mucosal thickening. Diffuse hyperostosis of the skull with calcification of the interhemispheric falx. IMPRESSION: 1. Small vessel  ischemic changes in the white matter of both cerebral hemispheres, midbrain and pons. This is most likely chronic. 2. Mild diffuse cerebral and cerebellar atrophy. 3. Moderate chronic right sphenoid sinusitis. Electronically Signed   By: Claudie Revering M.D.   On: 09/13/2015 13:36    Assessment: 69 y.o. 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 She did not receive IV thrombolysis due to late presentation. Aspirin. Complete stroke work up. Stroke team will follow up tomorrow.  Stroke Risk Factors - age,  HTN, HIV, cancer  Plan: 1. HgbA1c, fasting lipid panel 2. MRI, MRA  of the brain without contrast 3. Echocardiogram 4. Carotid dopplers 5. Prophylactic therapy-aspirin 6. Risk factor modification 7. Telemetry monitoring 8. Frequent neuro checks 9. PT/OT SLP  Dorian Pod, MD [facility] [phone]  09/13/2015, 10:11 PM

## 2015-09-13 NOTE — ED Notes (Signed)
Patient states she started feeling confused, but it resolved by 11:30am.  She states her family member wanted to be checked out because they feel like her speech is still slurred.

## 2015-09-13 NOTE — Progress Notes (Signed)
Pt arrived to room 55m07. Pt is alert and oriented.  Son at bedside. Safety measures in place.  Will continue to monitor.   Fredrich Romans, RN

## 2015-09-13 NOTE — H&P (Signed)
[facility] History and Physical  Alvine Mostafa CZY:[id number] DOB: 10-19-1945 DOA: 09/13/2015  Referring physician: EDP PCP: [name], MD   Chief Complaint: AMS   HPI: Kelly Chavez is a 69 y.o. female who presents to the ED at [facility] with 12 hours of confusion and disorientation that occurred yesterday.  This resolved apparently.  But today patient has generalized weakness, worse on the L side, with question of new left sided facial droop per family.  Review of Systems: Systems reviewed.  As above, otherwise negative  Past Medical History  Diagnosis Date  . 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 Cancer of sigmoid colon [facility]) 2011    sigmoid colectomy   . Status post chemotherapy 2012  . Brain tumor (benign) ([place])   . Avascular necrosis of bones of both hips ([place])   . 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 HIV (human immunodeficiency virus infection) ([place])   . Hypertension   . Anxiety   . Complication of anesthesia     senitive to all   Past Surgical History  Procedure Laterality Date  . Tonsillectomy    . Total abdominal hysterectomy w/ bilateral salpingoophorectomy  2012  . Colon surgery      partial colectomy  . Mastectomy modified radical Right 06/15/2014    Procedure: RIGHT MODIFIED RADICAL MASTECTOMY;  Surgeon: Shann Medal, MD;  Location: [place];  Service: General;  Laterality: Right;  . Portacath placement N/A 06/15/2014    Procedure: INSERTION PORT-A-CATH LEFT SUBCLAVIAN;  Surgeon: Shann Medal, MD;  Location: [place];  Service: General;  Laterality: N/A;  . Abdominal hysterectomy     Social History:  reports that she has never smoked. She has never used smokeless tobacco. She reports that she does not drink alcohol or use illicit drugs.  No Known Allergies  Family History  Problem Relation Age of Onset  . Diabetes Father   . Hyperlipidemia Father   . Hypertension Mother   . Hyperlipidemia Mother   . Dementia  Mother   . Diabetes Sister   . Diabetes Brother      Prior to Admission medications   Medication Sig Start Date End Date Taking? Authorizing Provider  atenolol (TENORMIN) 50 MG tablet TAKE 1 TABLET BY MOUTH EVERY DAY 06/22/15   [name], MD  chlorhexidine gluconate (PERIDEX) 0.12 % solution Use as directed 15 mLs in the mouth or throat 2 (two) times daily. 08/17/15   [name], MD  Darunavir Ethanolate (PREZISTA) 800 MG tablet Take 1 tablet (800 mg total) by mouth daily. 02/08/15   [name], MD  emtricitabine-tenofovir (TRUVADA) 200-300 MG per tablet Take 1 tablet by mouth daily. 03/17/15   [name], MD  HYDROcodone-acetaminophen (NORCO/VICODIN) 5-325 MG per tablet Take 1-2 tablets by mouth every 4 (four) hours as needed for moderate pain. Patient not taking: Reported on 08/17/2015 06/16/14   Alphonsa Overall, MD  lidocaine-prilocaine (EMLA) cream APPLY 1 APPLICATION TOPICALLY AS NEEDED. 04/01/15   Wyatt Portela, MD  ritonavir (NORVIR) 100 MG TABS tablet Take 1 tablet (100 mg total) by mouth daily. 02/08/15   [name], MD   Physical Exam: Filed Vitals:   09/13/15 1900 09/13/15 2017  BP: 128/66 162/72  Pulse: 56 57  Temp:  98.5 F (36.9 C)  Resp:  18    BP 162/72 mmHg  Pulse 57  Temp(Src) 98.5 F (36.9 C) (Oral)  Resp 18  Ht 5' 5"  (1.651 m)  Wt 81.9 kg (  180 lb 8.9 oz)  BMI 30.05 kg/m2  SpO2 98%  General Appearance:    Alert, oriented, no distress, appears stated age  Head:    Normocephalic, atraumatic  Eyes:    PERRL, EOMI, sclera non-icteric        Nose:   Nares without drainage or epistaxis. Mucosa, turbinates normal  Throat:   Moist mucous membranes. Oropharynx without erythema or exudate.  Neck:   Supple. No carotid bruits.  No thyromegaly.  No lymphadenopathy.   Back:     No CVA tenderness, no spinal tenderness  Lungs:     Clear to auscultation bilaterally, without wheezes, rhonchi or rales  Chest wall:    No tenderness to palpitation  Heart:     Regular rate and rhythm without murmurs, gallops, rubs  Abdomen:     Soft, non-tender, nondistended, normal bowel sounds, no organomegaly  Genitalia:    deferred  Rectal:    deferred  Extremities:   No clubbing, cyanosis or edema.  Pulses:   2+ and symmetric all extremities  Skin:   Skin color, texture, turgor normal, no rashes or lesions  Lymph nodes:   Cervical, supraclavicular, and axillary nodes normal  Neurologic:   Generalized weakness, worse on L side.  Question of slurred speech.    Labs on Admission:  Basic Metabolic Panel:  Recent Labs Lab 09/13/15 1345  NA 139  K 3.6  CL 107  CO2 25  GLUCOSE 91  BUN 19  CREATININE 0.88  CALCIUM 8.7*   Liver Function Tests:  Recent Labs Lab 09/13/15 1345  AST 30  ALT 22  ALKPHOS 89  BILITOT 0.5  PROT 7.1  ALBUMIN 3.6   No results for input(s): LIPASE, AMYLASE in the last 168 hours. No results for input(s): AMMONIA in the last 168 hours. CBC:  Recent Labs Lab 09/13/15 1345  WBC 5.5  NEUTROABS 3.0  HGB 11.8*  HCT 38.2  MCV 97.2  PLT 211   Cardiac Enzymes:  Recent Labs Lab 09/13/15 1345  TROPONINI <0.03    BNP (last 3 results) No results for input(s): PROBNP in the last 8760 hours. CBG:  Recent Labs Lab 09/13/15 1355  GLUCAP 90    Radiological Exams on Admission: Ct Head Wo Contrast  09/13/2015  CLINICAL DATA:  Altered mental status.  Confusion.  Slurred speech. EXAM: CT HEAD WITHOUT CONTRAST TECHNIQUE: Contiguous axial images were obtained from the base of the skull through the vertex without intravenous contrast. COMPARISON:  None. FINDINGS: Diffusely enlarged ventricles and subarachnoid spaces. Patchy white matter low density in both cerebral hemispheres, midbrain and pons. No intracranial hemorrhage, mass lesion or CT evidence of acute infarction. Right sphenoid sinus mucosal thickening. Diffuse hyperostosis of the skull with calcification of the interhemispheric falx. IMPRESSION: 1. Small vessel  ischemic changes in the white matter of both cerebral hemispheres, midbrain and pons. This is most likely chronic. 2. Mild diffuse cerebral and cerebellar atrophy. 3. Moderate chronic right sphenoid sinusitis. Electronically Signed   By: Claudie Revering M.D.   On: 09/13/2015 13:36    EKG: Independently reviewed.  Assessment/Plan Principal Problem:   Stroke-like symptoms Active Problems:   HIV disease ([place])   Paranoia ([place])   1. Stroke-like symptoms -  1. Neuro aware of patient arrival 2. Patient refused LP at [facility] (paranoid, which appears to be baseline for patient see below), will see how much neuro wants to push this as needed in this patients case.  Given 0 SIRS criteria, and known CD4  count of 500 earlier this month with undetectable viral load: feel that HSV, cryptococcus, are somewhat less likely, so I didn't further push the issue with patient yet. 3. Stroke pathway 4. MRI pending 5. PT/OT/SLP 6. 2D echo and carotid duplex not yet ordered pending neuro eval. 2. HIV - 1. Continue home meds 3. Paranoia - Patient refused LP as noted above (see Dr. Miachel Roux note for further info), the paranoia does appear to be longstanding and ongoing (see Dr. Storm Frisk office note earlier this month). 4. 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 MRI brain pending    Code Status: Full Code  Family Communication: No family in room Disposition Plan: Admit to inpatient   Time spent: 70 min  GARDNER, JARED M. [facility] Pager [phone]  If 7AM-7PM, please contact the day team taking care of the patient [email or web address] Password [facility] 09/13/2015, 8:49 PM

## 2015-09-13 NOTE — ED Notes (Signed)
[facility] was called to get an estimated arrival time.  [facility] stated it would be 1.5 to 2 hours before they arrived.

## 2015-09-13 NOTE — Progress Notes (Signed)
69 yo F with a chief complaint of confusion and slurred speech. This started yesterday evening. Patient today decided that she was given an drive around town and then felt that she was driving into other peoples lanes.  family earlier was concerned that she continue to have slurred speech.  Found to have generalized weakness , no focal sx , admit to tele for CVA workup, neurology aware

## 2015-09-13 NOTE — ED Provider Notes (Signed)
CSN: [id number]     Arrival date & time 09/13/15  1219 History   First MD Initiated Contact with Patient 09/13/15 1229     Chief Complaint  Patient presents with  . Altered Mental Status    yesterday - resolved today     (Consider location/radiation/quality/duration/timing/severity/associated sxs/prior Treatment) Patient is a 69 y.o. female presenting with altered mental status. The history is provided by the patient and a relative.  Altered Mental Status Presenting symptoms: confusion and disorientation   Severity:  Moderate Most recent episode:  Yesterday Episode history:  Single Duration:  12 hours Timing:  Constant Progression:  Partially resolved Chronicity:  New Context: not head injury, taking medications as prescribed, not a nursing home resident and not a recent change in medication   Associated symptoms: no fever, no headaches, no nausea, no palpitations and no vomiting     69 yo F with a chief complaint of confusion and slurred speech. This started yesterday evening. Patient today decided that she was given an drive around town and then felt that she was driving into other peoples lanes. Patient then drove home and was feeling mildly better. She had talked with her family earlier was concerned that she continue to have slurred speech. They then suggested that her friends take her to the hospital. Patient feels that her confusion has resolved as well as she had some weakness 2. Patient feels that her slurred speech has persisted. Left-sided weakness the patient is unable to explain the reasoning behind.  Past Medical History  Diagnosis Date  . 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 Cancer of sigmoid colon [facility]) 2011    sigmoid colectomy   . Status post chemotherapy 2012  . Brain tumor (benign) ([place])   . Avascular necrosis of bones of both hips ([place])   . 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 HIV (human immunodeficiency virus infection)  ([place])   . Hypertension   . Anxiety   . Complication of anesthesia     senitive to all   Past Surgical History  Procedure Laterality Date  . Tonsillectomy    . Total abdominal hysterectomy w/ bilateral salpingoophorectomy  2012  . Colon surgery      partial colectomy  . Mastectomy modified radical Right 06/15/2014    Procedure: RIGHT MODIFIED RADICAL MASTECTOMY;  Surgeon: Shann Medal, MD;  Location: [place];  Service: General;  Laterality: Right;  . Portacath placement N/A 06/15/2014    Procedure: INSERTION PORT-A-CATH LEFT SUBCLAVIAN;  Surgeon: Shann Medal, MD;  Location: [place];  Service: General;  Laterality: N/A;  . Abdominal hysterectomy     Family History  Problem Relation Age of Onset  . Diabetes Father   . Hyperlipidemia Father   . Hypertension Mother   . Hyperlipidemia Mother   . Dementia Mother   . Diabetes Sister   . Diabetes Brother    Social History  Substance Use Topics  . Smoking status: Never Smoker   . Smokeless tobacco: Never Used  . Alcohol Use: No   OB History    Gravida Para Term Preterm AB TAB SAB Ectopic Multiple Living   1              Review of Systems  Constitutional: Negative for fever and chills.  HENT: Negative for congestion and rhinorrhea.   Eyes: Negative for redness and visual disturbance.  Respiratory: Negative for shortness of breath and wheezing.   Cardiovascular: Negative for chest pain  and palpitations.  Gastrointestinal: Negative for nausea and vomiting.  Genitourinary: Negative for dysuria and urgency.  Musculoskeletal: Negative for myalgias and arthralgias.  Skin: Negative for pallor and wound.  Neurological: Negative for dizziness and headaches.  Psychiatric/Behavioral: Positive for confusion.      Allergies  Review of patient's allergies indicates no known allergies.  Home Medications   Prior to Admission medications   Medication Sig Start Date End Date Taking? Authorizing Provider  atenolol (TENORMIN) 50 MG  tablet TAKE 1 TABLET BY MOUTH EVERY DAY 06/22/15   [name], MD  chlorhexidine gluconate (PERIDEX) 0.12 % solution Use as directed 15 mLs in the mouth or throat 2 (two) times daily. 08/17/15   [name], MD  Darunavir Ethanolate (PREZISTA) 800 MG tablet Take 1 tablet (800 mg total) by mouth daily. 02/08/15   [name], MD  emtricitabine-tenofovir (TRUVADA) 200-300 MG per tablet Take 1 tablet by mouth daily. 03/17/15   [name], MD  HYDROcodone-acetaminophen (NORCO/VICODIN) 5-325 MG per tablet Take 1-2 tablets by mouth every 4 (four) hours as needed for moderate pain. Patient not taking: Reported on 08/17/2015 06/16/14   Alphonsa Overall, MD  lidocaine-prilocaine (EMLA) cream APPLY 1 APPLICATION TOPICALLY AS NEEDED. 04/01/15   Wyatt Portela, MD  ritonavir (NORVIR) 100 MG TABS tablet Take 1 tablet (100 mg total) by mouth daily. 02/08/15   [name], MD   BP 142/65 mmHg  Pulse 54  Temp(Src) 98.4 F (36.9 C) (Oral)  Resp 18  Ht 5\' 3"  (1.6 m)  Wt 167 lb (75.751 kg)  BMI 29.59 kg/m2  SpO2 100% Physical Exam  Constitutional: She is oriented to person, place, and time. She appears well-developed and well-nourished. No distress.  HENT:  Head: Normocephalic and atraumatic.  Eyes: EOM are normal. Pupils are equal, round, and reactive to light.  Neck: Normal range of motion. Neck supple.  Cardiovascular: Normal rate and regular rhythm.  Exam reveals no gallop and no friction rub.   No murmur heard. Pulmonary/Chest: Effort normal. She has no wheezes. She has no rales.  Abdominal: Soft. She exhibits no distension. There is no tenderness. There is no rebound and no guarding.  Musculoskeletal: She exhibits no edema or tenderness.  Neurological: She is alert and oriented to person, place, and time. No sensory deficit. GCS eye subscore is 4. GCS verbal subscore is 5. GCS motor subscore is 6. She displays no Babinski's sign on the right side. She displays no Babinski's sign on the left side.   Reflex Scores:      Tricep reflexes are 2+ on the right side and 2+ on the left side.      Bicep reflexes are 2+ on the right side and 2+ on the left side.      Brachioradialis reflexes are 2+ on the right side and 2+ on the left side.      Patellar reflexes are 2+ on the right side and 2+ on the left side.      Achilles reflexes are 2+ on the right side and 2+ on the left side. Mild left-sided facial droop that the family feels is new. Slurred speech. 4 out of 5 muscle strength to the left upper and left lower extremity which does fit patient states is chronic.  Skin: Skin is warm and dry. She is not diaphoretic.  Psychiatric: She has a normal mood and affect. Her behavior is normal.  Nursing note and vitals reviewed.   ED Course  Procedures (including critical care time) Labs  Review Labs Reviewed  PROTIME-INR - Abnormal; Notable for the following:    Prothrombin Time 15.3 (*)    All other components within normal limits  APTT - Abnormal; Notable for the following:    aPTT 154 (*)    All other components within normal limits  CBC - Abnormal; Notable for the following:    Hemoglobin 11.8 (*)    All other components within normal limits  COMPREHENSIVE METABOLIC PANEL - Abnormal; Notable for the following:    Calcium 8.7 (*)    All other components within normal limits  CSF CULTURE  GRAM STAIN  ETHANOL  DIFFERENTIAL  TROPONIN I  URINE RAPID DRUG SCREEN, HOSP PERFORMED  URINALYSIS, ROUTINE W REFLEX MICROSCOPIC (NOT AT [facility])  CSF CELL COUNT WITH DIFFERENTIAL  CSF CELL COUNT WITH DIFFERENTIAL  GLUCOSE, CSF  PROTEIN, CSF  CRYPTOCOCCAL ANTIGEN, CSF  HERPES SIMPLEX VIRUS(HSV) DNA BY PCR  CBG MONITORING, ED    Imaging Review Ct Head Wo Contrast  09/13/2015  CLINICAL DATA:  Altered mental status.  Confusion.  Slurred speech. EXAM: CT HEAD WITHOUT CONTRAST TECHNIQUE: Contiguous axial images were obtained from the base of the skull through the vertex without intravenous contrast.  COMPARISON:  None. FINDINGS: Diffusely enlarged ventricles and subarachnoid spaces. Patchy white matter low density in both cerebral hemispheres, midbrain and pons. No intracranial hemorrhage, mass lesion or CT evidence of acute infarction. Right sphenoid sinus mucosal thickening. Diffuse hyperostosis of the skull with calcification of the interhemispheric falx. IMPRESSION: 1. Small vessel ischemic changes in the white matter of both cerebral hemispheres, midbrain and pons. This is most likely chronic. 2. Mild diffuse cerebral and cerebellar atrophy. 3. Moderate chronic right sphenoid sinusitis. Electronically Signed   By: Claudie Revering M.D.   On: 09/13/2015 13:36   I have personally reviewed and evaluated these images and lab results as part of my medical decision-making.   EKG Interpretation   Date/Time:  Monday September 13 2015 12:31:29 EST Ventricular Rate:  50 PR Interval:  168 QRS Duration: 82 QT Interval:  444 QTC Calculation: 404 R Axis:   -14 Text Interpretation:  Sinus bradycardia Left ventricular hypertrophy  Nonspecific T wave abnormality Abnormal ECG No old tracing to compare  Confirmed by Amarise Lillo MD, DANIEL [phone]) on 09/13/2015 12:35:39 PM      MDM   Final diagnoses:  Transient alteration of awareness  Stroke-like symptoms    69 yo F with a chief complaints of slurred speech. Patient had transient unsteadiness that she thinks partially resolved. Patient having continued slurred speech so the family sent her here. Neuro exam with left-sided weakness. Patient to weak to actually sit up and walk. CT of the head with no new significant intracranial findings. Will discuss the case with neurology.  Neurology will like the patient admitted over at [facility]. Will transfer the patient there. Requesting an LP prior to transfer. Discussed with the patient, she is concerned that this will leave her permanently paralyzed. She states that people have been experimenting on her and that  have left her with a brain infection that is causing her to cough up sputum. Attempted to explain to the patient for approximately 20 minutes need for LP. Currently declining at this time.  The patients results and plan were reviewed and discussed.   Any x-rays performed were independently reviewed by myself.   Differential diagnosis were considered with the presenting HPI.  Medications  lidocaine-EPINEPHrine (XYLOCAINE W/EPI) 1 %-1:100000 (with pres) injection 20 mL (not administered)  Filed Vitals:   09/13/15 1230 09/13/15 1345 09/13/15 1400 09/13/15 1516  BP: 174/65 134/70 156/73 142/65  Pulse: 51 57 52 54  Temp: 98.3 F (36.8 C)   98.4 F (36.9 C)  TempSrc: Oral   Oral  Resp: 20   18  Height: 5\' 3"  (1.6 m)     Weight: 167 lb (75.751 kg)     SpO2: 100% 100% 100% 100%    Final diagnoses:  Transient alteration of awareness  Stroke-like symptoms    Admission/ observation were discussed with the admitting physician, patient and/or family and they are comfortable with the plan.    Deno Etienne, DO 09/13/15 1600

## 2015-09-14 ENCOUNTER — Ambulatory Visit (HOSPITAL_COMMUNITY): Payer: Medicare Other

## 2015-09-14 DIAGNOSIS — I6302 Cerebral infarction due to thrombosis of basilar artery: Principal | ICD-10-CM

## 2015-09-14 DIAGNOSIS — I639 Cerebral infarction, unspecified: Secondary | ICD-10-CM | POA: Diagnosis present

## 2015-09-14 DIAGNOSIS — I6789 Other cerebrovascular disease: Secondary | ICD-10-CM

## 2015-09-14 LAB — LIPID PANEL
Cholesterol: 199 mg/dL (ref 0–200)
HDL: 33 mg/dL — ABNORMAL LOW
LDL Cholesterol: 140 mg/dL — ABNORMAL HIGH (ref 0–99)
Total CHOL/HDL Ratio: 6 ratio
Triglycerides: 129 mg/dL
VLDL: 26 mg/dL (ref 0–40)

## 2015-09-14 MED ORDER — ASPIRIN 325 MG PO TABS
325.0000 mg | ORAL_TABLET | Freq: Every day | ORAL | Status: DC
  Administered 2015-09-14 – 2015-09-15 (×2): 325 mg via ORAL
  Filled 2015-09-14 (×2): qty 1

## 2015-09-14 MED ORDER — CHLORHEXIDINE GLUCONATE 0.12 % MT SOLN
OROMUCOSAL | Status: AC
  Administered 2015-09-14: 15:00:00
  Filled 2015-09-14: qty 15

## 2015-09-14 MED ORDER — HALOPERIDOL LACTATE 5 MG/ML IJ SOLN: 2.0000 mg | Freq: Four times a day (QID) | INTRAMUSCULAR | Status: DC | PRN

## 2015-09-14 MED ORDER — ATORVASTATIN CALCIUM 40 MG PO TABS
40.0000 mg | ORAL_TABLET | Freq: Every day | ORAL | Status: DC
  Administered 2015-09-14: 40 mg via ORAL
  Filled 2015-09-14: qty 1

## 2015-09-14 NOTE — Evaluation (Signed)
Physical Therapy Evaluation Patient Details Name: Kelly Chavez MRN: [id number] DOB: 01/27/1946 Today's Date: 09/14/2015   History of Present Illness  Adm with confusion and Lt sided weakness. MRI acute Lt pons infarct  PMHx- brain tumor with craniotomy, colon Ca, breast Ca, bil hip avascular necrosis with limited ROM    Clinical Impression  Patient evaluated by Physical Therapy with no further acute PT needs identified. Pt has very limited bil hip ROM (PTA) and has modified her techniques for mobility to compensate for this. She demonstrated excellent safety awareness (related to mobility). She reports she is at her baseline. All education has been completed and the patient has no further questions.  PT is signing off. Thank you for this referral.  **Note- pt reports she drives herself to grocery store. ? Appropriateness/safe given deficits. MD may want to address     Follow Up Recommendations No PT follow up    Equipment Recommendations  None recommended by PT    Recommendations for Other Services       Precautions / Restrictions Precautions Precautions: Fall Precaution Comments: pt is very safety conscious       Mobility  Bed Mobility Overal bed mobility: Modified Independent             General bed mobility comments: HOB flat; incr time and effort, however pt has her method she uses to compensate for decr hip ROM  Transfers Overall transfer level: Modified independent Equipment used: Rolling walker (2 wheeled);Quad cane             General transfer comment: Pt must leverage lower legs against bed or heavy chair to come to stand (due to lack of hip flexion); x 4including BSC  Ambulation/Gait Ambulation/Gait assistance: Min guard;Modified independent (Device/Increase time) Ambulation Distance (Feet): 150 Feet (30, 100, 20) Assistive device: Rolling walker (2 wheeled);Quad cane Gait Pattern/deviations: Step-through pattern;Decreased stride length Gait  velocity: slow and cautious Gait velocity interpretation: <1.8 ft/sec, indicative of risk for recurrent falls General Gait Details: due to limited hip ROM, pt "vaults" to advance LEs via pelvic rotation  Stairs            Wheelchair Mobility    Modified Rankin (Stroke Patients Only) Modified Rankin (Stroke Patients Only) Pre-Morbid Rankin Score: No significant disability Modified Rankin: Slight disability     Balance Overall balance assessment: Modified Independent (denies falls in past year (except fall just PTA while confus)                                           Pertinent Vitals/Pain Pain Assessment: No/denies pain    Home Living Family/patient expects to be discharged to:: Private residence Living Arrangements: Alone Available Help at Discharge:  (none immediate; son lives out of town) Type of Home: Apartment Home Access: Level entry     Home Layout: One level Home Equipment: Shower seat - built in;Bedside commode;Cane - single point;Walker - 4 wheels (uses BSC over toilet)      Prior Function Level of Independence: Independent with assistive device(s)         Comments: RW outside and for certain things inside home; used cane into bathroom; drives; gets groceries with electric cart     Hand Dominance        Extremity/Trunk Assessment   Upper Extremity Assessment: Defer to OT evaluation;LUE deficits/detail       LUE Deficits /  Details: pt reports these changes are not new   Lower Extremity Assessment: RLE deficits/detail;LLE deficits/detail RLE Deficits / Details: limited hip flexion to ~45 due to avascular necrosis; knee WFL; ankles to neutral; unable to fully assess hip strength; knee extension 5/5, dorsiflexion 5/5 LLE Deficits / Details: limited hip flexion to ~45 due to avascular necrosis; knee WFL; ankles to neutral; unable to fully assess hip strength; knee extension 5/5, dorsiflexion 5/5  Cervical / Trunk Assessment:  Kyphotic  Communication   Communication: Expressive difficulties (states her speech is slower and slightly slurred)  Cognition Arousal/Alertness: Awake/alert Behavior During Therapy: WFL for tasks assessed/performed Overall Cognitive Status: Within Functional Limits for tasks assessed                      General Comments      Exercises        Assessment/Plan    PT Assessment Patent does not need any further PT services  PT Diagnosis Difficulty walking   PT Problem List    PT Treatment Interventions     PT Goals (Current goals can be found in the Care Plan section) Acute Rehab PT Goals Patient Stated Goal: return home; has been thinking it's about time to look at Assisted Living PT Goal Formulation: All assessment and education complete, DC therapy    Frequency     Barriers to discharge        Co-evaluation               End of Session Equipment Utilized During Treatment: Gait belt Activity Tolerance: Patient tolerated treatment well Patient left: in chair;with call bell/phone within reach;with chair alarm set           Time: [id number] PT Time Calculation (min) (ACUTE ONLY): 59 min   Charges:   PT Evaluation $Initial PT Evaluation Tier I: 1 Procedure PT Treatments $Gait Training: 38-52 mins   PT G Codes:        Axell Trigueros 10-08-2015, 3:03 PM Pager [phone]

## 2015-09-14 NOTE — Progress Notes (Signed)
Vas US carotid and 2d echo ordered.

## 2015-09-14 NOTE — Care Management Note (Signed)
Case Management Note  Patient Details  Name: Kelly Chavez MRN: [id number] Date of Birth: April 14, 1946  Subjective/Objective:   Patient was admitted with CVA. Patient is from home alone.                  Action/Plan: Awaiting further testing and PT/OT recommendations. CM will continue to follow for discharge needs.   Expected Discharge Date:                  Expected Discharge Plan:     In-House Referral:     Discharge planning Services     Post Acute Care Choice:    Choice offered to:     DME Arranged:    DME Agency:     HH Arranged:    HH Agency:     Status of Service:  In process, will continue to follow  Medicare Important Message Given:    Date Medicare IM Given:    Medicare IM give by:    Date Additional Medicare IM Given:    Additional Medicare Important Message give by:     If discussed at [place] of Stay Meetings, dates discussed:    Additional Comments:  Pollie Friar, RN 09/14/2015, 10:05 AM

## 2015-09-14 NOTE — Progress Notes (Signed)
  Echocardiogram 2D Echocardiogram has been performed.  Jennette Dubin 09/14/2015, 3:20 PM

## 2015-09-14 NOTE — Progress Notes (Signed)
[facility] PROGRESS NOTE    Progress Note   Kelly Chavez [id number] DOB: 06/28/1946 DOA: 09/13/2015 PCP: [name], MD   Brief Narrative:   Kelly Chavez is an 69 y.o. female Sincerely ED with 12 hours of confusion and disorientation that started a day prior to admission but apparently resolved when she arrived to the ED.  Assessment/Plan:   CVA: HgbA1c, fasting lipid panel HDL < 40, LDL > 120 cont statins. MRI, MRA of the brain without contrast Showed acute nonhemorrhagic left pontine infarct.Probably no need for LP as her symptoms can be explained by acute infarct. PT consult, OT consult Pending. Echocardiogram and carotid dopplers Pending. Prophylactic therapy-Antiplatelet med: Aspirin - dose 325 mg PO daily, She is not on aspirin at home. Avoid D5 fluids as may be harmfull Cardiac Monitoring No events, sinus bradycardia Neurochecks q4h   HIV disease ([place]) Continue current home regimen.  Paranoia ([place]): On no medications, start Haldol when necessary for agitation. 12-lead EKG tomorrow morning. Question if her symptoms of paranoia worsen to the stroke itself unlikely, due to location.    DVT Prophylaxis - Lovenox ordered.  Family Communication: none Disposition Plan: Home 1-2 days Code Status:     Code Status Orders        Start     Ordered   09/13/15 2044  Full code   Continuous     09/13/15 2045    Advance Directive Documentation        Most Recent Value   Type of Advance Directive  Healthcare Power of Attorney   Pre-existing out of facility DNR order (yellow form or pink MOST form)     "MOST" Form in Place?          IV Access:    Peripheral IV   Procedures and diagnostic studies:   Ct Head Wo Contrast  09/13/2015  CLINICAL DATA:  Altered mental status.  Confusion.  Slurred speech. EXAM: CT HEAD WITHOUT CONTRAST TECHNIQUE: Contiguous axial images were obtained from the base of the skull through the vertex without intravenous  contrast. COMPARISON:  None. FINDINGS: Diffusely enlarged ventricles and subarachnoid spaces. Patchy white matter low density in both cerebral hemispheres, midbrain and pons. No intracranial hemorrhage, mass lesion or CT evidence of acute infarction. Right sphenoid sinus mucosal thickening. Diffuse hyperostosis of the skull with calcification of the interhemispheric falx. IMPRESSION: 1. Small vessel ischemic changes in the white matter of both cerebral hemispheres, midbrain and pons. This is most likely chronic. 2. Mild diffuse cerebral and cerebellar atrophy. 3. Moderate chronic right sphenoid sinusitis. Electronically Signed   By: Kelly Revering M.D.   On: 09/13/2015 13:36   Mr Brain Wo Contrast  09/13/2015  CLINICAL DATA:  69 year old female with 12 hours of confusion and disorientation. Generalized weakness worse on the left. Left facial droop. Slurred speech. HIV positive. Hypertension. 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 Breast cancer post radical mastectomy August 2015. Subsequent encounter. EXAM: MRI HEAD WITHOUT CONTRAST MRA HEAD WITHOUT CONTRAST TECHNIQUE: Multiplanar, multiecho pulse sequences of the brain and surrounding structures were obtained without intravenous contrast. Angiographic images of the head were obtained using MRA technique without contrast. COMPARISON:  09/13/2015 head CT. FINDINGS: MRI HEAD FINDINGS Acute nonhemorrhagic left pons infarct. Small vessel disease type changes. Global atrophy without hydrocephalus. Prominent ossification of the falx and hyperostosis of the calvarium. Exophthalmos. No intracranial mass lesion noted on this unenhanced exam. Expanded partially empty sella without flattening of the posterior globes as seen with pseudotumor cerebri. Major intracranial  vascular structures are patent. Mild spinal stenosis C4-5 and C5-6. Cervical medullary junction and pineal region unremarkable. Minimal mucosal thickening ethmoid sinus air cells. MRA HEAD FINDINGS Mild to  moderate narrowing distal left vertebral artery. Mild narrowing distal right vertebral artery. Moderate to marked narrowing mid aspect basilar artery. Nonvisualized anterior inferior cerebellar arteries. Narrowing and irregularity posterior inferior cerebellar arteries more notable on the left. Marked tandem stenoses posterior cerebral arteries more notable on the left. Moderate to marked narrowing left internal carotid artery cavernous and supraclinoid segment Marked narrowing mid aspect M1 segment right middle cerebral artery. 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 Moderate to marked narrowing of portions of the A2 segment of the anterior cerebral artery bilaterally. Mild to moderate narrowing middle cerebral artery branch vessels bilaterally. No aneurysm noted. IMPRESSION: MRI HEAD Acute nonhemorrhagic left pons infarct. Small vessel disease type changes. Global atrophy without hydrocephalus. Expanded partially empty sella. Mild spinal stenosis C4-5 and C5-6. MRA HEAD Mild to moderate narrowing distal left vertebral artery. Mild narrowing distal right vertebral artery. Moderate to marked narrowing mid aspect basilar artery. Nonvisualized anterior inferior cerebellar arteries. Narrowing and irregularity posterior inferior cerebellar arteries more notable on the left. Marked tandem stenoses posterior cerebral arteries more notable on the left. Moderate to marked narrowing left internal carotid artery cavernous and supraclinoid segment Marked narrowing mid aspect M1 segment right middle cerebral artery. 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 Moderate to marked narrowing of portions of the A2 segment of the anterior cerebral artery bilaterally. Mild to moderate narrowing middle cerebral artery branch vessels bilaterally. Electronically Signed   By: Kelly Del M.D.   On: 09/13/2015 22:52   Mr [name] Head/brain Wo  Cm  09/13/2015  CLINICAL DATA:  69 year old female with 12 hours of confusion and disorientation. Generalized weakness worse on the left. Left facial droop. Slurred speech. HIV positive. Hypertension. 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 Breast cancer post radical mastectomy August 2015. Subsequent encounter. EXAM: MRI HEAD WITHOUT CONTRAST MRA HEAD WITHOUT CONTRAST TECHNIQUE: Multiplanar, multiecho pulse sequences of the brain and surrounding structures were obtained without intravenous contrast. Angiographic images of the head were obtained using MRA technique without contrast. COMPARISON:  09/13/2015 head CT. FINDINGS: MRI HEAD FINDINGS Acute nonhemorrhagic left pons infarct. Small vessel disease type changes. Global atrophy without hydrocephalus. Prominent ossification of the falx and hyperostosis of the calvarium. Exophthalmos. No intracranial mass lesion noted on this unenhanced exam. Expanded partially empty sella without flattening of the posterior globes as seen with pseudotumor cerebri. Major intracranial vascular structures are patent. Mild spinal stenosis C4-5 and C5-6. Cervical medullary junction and pineal region unremarkable. Minimal mucosal thickening ethmoid sinus air cells. MRA HEAD FINDINGS Mild to moderate narrowing distal left vertebral artery. Mild narrowing distal right vertebral artery. Moderate to marked narrowing mid aspect basilar artery. Nonvisualized anterior inferior cerebellar arteries. Narrowing and irregularity posterior inferior cerebellar arteries more notable on the left. Marked tandem stenoses posterior cerebral arteries more notable on the left. Moderate to marked narrowing left internal carotid artery cavernous and supraclinoid segment Marked narrowing mid aspect M1 segment right middle cerebral artery. 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 Moderate to marked narrowing of portions of the A2 segment of the anterior  cerebral artery bilaterally. Mild to moderate narrowing middle cerebral artery branch vessels bilaterally. No aneurysm noted. IMPRESSION: MRI HEAD Acute nonhemorrhagic left pons infarct. Small vessel  disease type changes. Global atrophy without hydrocephalus. Expanded partially empty sella. Mild spinal stenosis C4-5 and C5-6. MRA HEAD Mild to moderate narrowing distal left vertebral artery. Mild narrowing distal right vertebral artery. Moderate to marked narrowing mid aspect basilar artery. Nonvisualized anterior inferior cerebellar arteries. Narrowing and irregularity posterior inferior cerebellar arteries more notable on the left. Marked tandem stenoses posterior cerebral arteries more notable on the left. Moderate to marked narrowing left internal carotid artery cavernous and supraclinoid segment Marked narrowing mid aspect M1 segment right middle cerebral artery. 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 Moderate to marked narrowing of portions of the A2 segment of the anterior cerebral artery bilaterally. Mild to moderate narrowing middle cerebral artery branch vessels bilaterally. Electronically Signed   By: Kelly Del M.D.   On: 09/13/2015 22:52     Medical Consultants:    None.  Anti-Infectives:   Anti-infectives    Start     Dose/Rate Route Frequency Ordered Stop   09/14/15 1000  emtricitabine-tenofovir AF (DESCOVY) 200-25 MG per tablet 1 tablet     1 tablet Oral Daily 09/13/15 2024     09/14/15 0800  ritonavir (NORVIR) tablet 100 mg     100 mg Oral Daily with breakfast 09/13/15 2024     09/14/15 0800  Darunavir Ethanolate (PREZISTA) tablet 800 mg     800 mg Oral Daily with breakfast 09/13/15 2024        Subjective:    Kelly Chavez She relates no new deficits.  Objective:    Filed Vitals:   09/14/15 0400 09/14/15 0600 09/14/15 0700 09/14/15 0909  BP: 126/56 136/67 137/56 143/85  Pulse: 54 60 70 62  Temp: 98.2 F (36.8 C)  98.4 F (36.9 C)  98.5 F (36.9 C)  TempSrc: Oral Oral  Oral  Resp: 18 16  20   Height:      Weight:      SpO2: 98% 98% 99% 98%    Intake/Output Summary (Last 24 hours) at 09/14/15 0941 Last data filed at 09/14/15 0908  Gross per 24 hour  Intake    120 ml  Output      0 ml  Net    120 ml   Filed Weights   09/13/15 1230 09/13/15 2017  Weight: 75.751 kg (167 lb) 81.9 kg (180 lb 8.9 oz)    Exam: Gen:  NAD Cardiovascular:  RRR, No M/R/G Chest and lungs:   CTAB Abdomen:  Abdomen soft, NT/ND, + BS Extremities:  No C/E/C Neuro exam:Seems Non-focal on physical exam.Lower extremity weakness symmetrical bilaterally.    Data Reviewed:    Labs: Basic Metabolic Panel:  Recent Labs Lab 09/13/15 1345  NA 139  K 3.6  CL 107  CO2 25  GLUCOSE 91  BUN 19  CREATININE 0.88  CALCIUM 8.7*   GFR Estimated Creatinine Clearance: 64.7 mL/min (by C-G formula based on Cr of 0.88). Liver Function Tests:  Recent Labs Lab 09/13/15 1345  AST 30  ALT 22  ALKPHOS 89  BILITOT 0.5  PROT 7.1  ALBUMIN 3.6   No results for input(s): LIPASE, AMYLASE in the last 168 hours. No results for input(s): AMMONIA in the last 168 hours. Coagulation profile  Recent Labs Lab 09/13/15 1345  INR 1.19    CBC:  Recent Labs Lab 09/13/15 1345  WBC 5.5  NEUTROABS 3.0  HGB 11.8*  HCT 38.2  MCV 97.2  PLT 211   Cardiac Enzymes:  Recent Labs Lab  09/13/15 1345  TROPONINI <0.03   BNP (last 3 results) No results for input(s): PROBNP in the last 8760 hours. CBG:  Recent Labs Lab 09/13/15 1355  GLUCAP 90   D-Dimer: No results for input(s): DDIMER in the last 72 hours. Hgb A1c: No results for input(s): HGBA1C in the last 72 hours. Lipid Profile:  Recent Labs  09/14/15 0227  CHOL 199  HDL 33*  LDLCALC 140*  TRIG 129  CHOLHDL 6.0   Thyroid function studies: No results for input(s): TSH, T4TOTAL, T3FREE, THYROIDAB in the last 72 hours.  Invalid input(s): FREET3 Anemia  work up: No results for input(s): VITAMINB12, FOLATE, FERRITIN, TIBC, IRON, RETICCTPCT in the last 72 hours. Sepsis Labs:  Recent Labs Lab 09/13/15 1345  WBC 5.5   Microbiology No results found for this or any previous visit (from the past 240 hour(s)).   Medications:   .  stroke: mapping our early stages of recovery book   Does not apply Once  . atenolol  50 mg Oral Daily  . chlorhexidine gluconate  15 mL Mouth/Throat BID  . Darunavir Ethanolate  800 mg Oral Q breakfast  . emtricitabine-tenofovir AF  1 tablet Oral Daily  . heparin  5,000 Units Subcutaneous 3 times per day  . lidocaine-EPINEPHrine  20 mL Intradermal Once  . ritonavir  100 mg Oral Q breakfast   Continuous Infusions:   Time spent: 25 min   LOS: 1 day   Charlynne Cousins  [facility] Pager [phone]  *Please refer to [email or web address], password [email or web address] to get updated schedule on who will round on this patient, as hospitalists switch teams weekly. If 7PM-7AM, please contact night-coverage at [email or web address], password [email or web address] for any overnight needs.  09/14/2015, 9:41 AM

## 2015-09-14 NOTE — Evaluation (Signed)
Speech Language Pathology Evaluation Patient Details Name: Kelly Chavez MRN: [id number] DOB: 01/30/1946 Today's Date: 09/14/2015 Time: [id number] SLP Time Calculation (min) (ACUTE ONLY): 30 min  Problem List:  Patient Active Problem List   Diagnosis Date Noted  . Acute CVA (cerebrovascular accident) ([place]) 09/14/2015  . CVA (cerebral infarction) 09/13/2015  . Hyperlipidemia 05/31/2014  . Pedal edema 05/11/2014  . Paranoia ([place]) 05/11/2014  . Breast cancer of upper-inner quadrant of right female breast ([place]) 04/15/2014  . History of pituitary tumor 01/28/2014  . Avascular necrosis of bones of both hips ([place]) 01/28/2014  . Genital herpes 01/28/2014  . Ovarian cyst, bilateral 01/26/2014  . Essential hypertension, benign 01/26/2014  . Unspecified vitamin D deficiency 01/26/2014  . DJD (degenerative joint disease) 01/26/2014  . HIV disease ([place]) 01/07/2014   Past Medical History:  Past Medical History  Diagnosis Date  . 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 Cancer of sigmoid colon [facility]) 2011    sigmoid colectomy   . Status post chemotherapy 2012  . Brain tumor (benign) ([place])   . Avascular necrosis of bones of both hips ([place])   . Invasive ductal carcinoma of right breast ([place]) 04/09/14  . HIV (human immunodeficiency virus infection) ([place])   . Hypertension   . Anxiety   . Complication of anesthesia     senitive to all   Past Surgical History:  Past Surgical History  Procedure Laterality Date  . Tonsillectomy    . Total abdominal hysterectomy w/ bilateral salpingoophorectomy  2012  . Colon surgery      partial colectomy  . Mastectomy modified radical Right 06/15/2014    Procedure: RIGHT MODIFIED RADICAL MASTECTOMY;  Surgeon: Shann Medal, MD;  Location: [facility];  Service: General;  Laterality: Right;  . Portacath placement N/A 06/15/2014    Procedure: INSERTION PORT-A-CATH LEFT SUBCLAVIAN;  Surgeon: Shann Medal, MD;  Location: [facility];  Service: General;   Laterality: N/A;  . Abdominal hysterectomy     HPI:  69 yo female adm to [facility] with 12 hours of AMS, confusion, dysarthria, left facial weakness.  PMH + for HIv +, breast cancer s/p surgery and chemo, brain tumor, HTN. Pt is a retired Psychologist, prison and probation services.  Found to have a left pons CVA.  Speech evaluation ordered.     Assessment / Plan / Recommendation Clinical Impression  Pt presents with minimal dysarthria - resulting in minimally articulatory deficits and slower rate of speech than baseline.  Son reported to this SLP via telephone that pt's speech is not normal.    She was oriented x4 and demonstrated good functional problem solving with NT while transferring to bedside commode.  Pt was distracted by frequent calls on her cell phone during session.  Pt admits to dysarthria but states "I'm not worried about speech", "I just want to resume my daily activities".  Pt educated to need for further  testing of cognitive linguistic abilities to assure home management skills intact as pt lives alone.  Pt agreeable to plan.     SLP Assessment  Patient needs continued Speech Lanaguage Pathology Services    Follow Up Recommendations  Other (comment) (TBD)    Frequency and Duration min 1 x/week  1 week      SLP Evaluation Prior Functioning  Cognitive/Linguistic Baseline: Within functional limits  Lives With: Alone Education: Master's degree in Education- retired Public house manager: Retired   Associate Professor  Overall Cognitive Status: Within Advertising copywriter for tasks assessed Arousal/Alertness: Awake/alert Orientation Level: Oriented X4 Attention:  Sustained Memory: Appears intact (for current environment) Awareness: Appears intact Problem Solving: Appears intact (asking for assistance to use restroom and need for pads) Safety/Judgment: Other (comment) (pt expressed desire to go home repeatedly despite being in hospital with a stroke) Comments: pt is distracted by use of her cell phone- ? pragmatics vs  behavior    Comprehension  Auditory Comprehension Overall Auditory Comprehension: Appears within functional limits for tasks assessed Yes/No Questions: Not tested Commands: Within Functional Limits Conversation: Complex Visual Recognition/Discrimination Discrimination: Not tested Reading Comprehension Reading Status: Within funtional limits (pt read her information on her cell phone )    Expression Expression Primary Mode of Expression: Verbal Verbal Expression Overall Verbal Expression: Appears within functional limits for tasks assessed Initiation: No impairment Level of Generative/Spontaneous Verbalization: Conversation Repetition: No impairment Naming: Not tested Pragmatics: No impairment Written Expression Dominant Hand: Right Written Expression: Not tested   Oral / Motor Oral Motor/Sensory Function Overall Oral Motor/Sensory Function: Within functional limits Motor Speech Overall Motor Speech: Appears within functional limits for tasks assessed Respiration: Within functional limits Resonance: Within functional limits Articulation: Within functional limitis Intelligibility: Intelligible (but son Legrand [place] reports speech is not at baseline as rate of articulation is slow) Motor Planning: Witnin functional limits Motor Speech Errors: Not applicable    Luanna Salk, [facility] SLP [phone]

## 2015-09-14 NOTE — Progress Notes (Signed)
STROKE TEAM PROGRESS NOTE   HISTORY Kelly Chavez is an 69 y.o. female with a past medical history that is relevant for HTN, HIV positive complicated by PCP, colon cancer s/p colectomy, right breast cancer s/p mastectomy, who initially presented to [facility] [place] [facility] [place] [facility] with 12 hours of confusion, disorientation, and reported new left face weakness and slurred speech by family. Her son is at the bedside and said that yesterday he noticed that she was slurring her words, and then her left face became droopy. He got concerned because she did not improve and decided to take her to the ED at [facility]. She was LKW 09/12/2015, unable to determine time. She denies associated HA, vertigo, focal weakness or numbness, double vision, difficulty swallowing, langauge or visual impairment. CT brain showed no acute abnormality. MRI/MRA brain  demonstrated an acute left pontine infarct and moderate to marked narrowing mid aspect basilar artery respectively. Patient was not administered TPA secondary to out of the window. She was admitted for further evaluation and treatment.   SUBJECTIVE (INTERVAL HISTORY) No family is at the bedside.  Overall she feels her condition is stable.    OBJECTIVE Temp:  [98.1 F (36.7 C)-98.5 F (36.9 C)] 98.5 F (36.9 C) (11/29 0909) Pulse Rate:  [51-70] 62 (11/29 0909) Cardiac Rhythm:  [-] Normal sinus rhythm (11/29 0700) Resp:  [16-20] 20 (11/29 0909) BP: (120-177)/(47-98) 143/85 mmHg (11/29 0909) SpO2:  [94 %-100 %] 98 % (11/29 0909) Weight:  [75.751 kg (167 lb)-81.9 kg (180 lb 8.9 oz)] 81.9 kg (180 lb 8.9 oz) (11/28 2017)  CBC:   Recent Labs Lab 09/13/15 1345  WBC 5.5  NEUTROABS 3.0  HGB 11.8*  HCT 38.2  MCV 97.2  PLT 123456    Basic Metabolic Panel:   Recent Labs Lab 09/13/15 1345  NA 139  K 3.6  CL 107  CO2 25  GLUCOSE 91  BUN 19  CREATININE 0.88  CALCIUM 8.7*    Lipid Panel:     Component Value Date/Time   CHOL 199 09/14/2015 0227   TRIG 129 09/14/2015 0227   HDL  33* 09/14/2015 0227   CHOLHDL 6.0 09/14/2015 0227   VLDL 26 09/14/2015 0227   LDLCALC 140* 09/14/2015 0227   HgbA1c: No results found for: HGBA1C Urine Drug Screen:     Component Value Date/Time   LABOPIA NONE DETECTED 09/13/2015 1600   COCAINSCRNUR NONE DETECTED 09/13/2015 1600   LABBENZ NONE DETECTED 09/13/2015 1600   AMPHETMU NONE DETECTED 09/13/2015 1600   THCU NONE DETECTED 09/13/2015 1600   LABBARB NONE DETECTED 09/13/2015 1600      IMAGING  Ct Head Wo Contrast 09/13/2015  1. Small vessel ischemic changes in the white matter of both cerebral hemispheres, midbrain and pons. This is most likely chronic. 2. Mild diffuse cerebral and cerebellar atrophy. 3. Moderate chronic right sphenoid sinusitis.   MRI HEAD 09/13/2015   Acute nonhemorrhagic left pons infarct. Small vessel disease type changes. Global atrophy without hydrocephalus. Expanded partially empty sella. Mild spinal stenosis C4-5 and C5-6.   MRA HEAD 09/13/2015   Mild to moderate narrowing distal left vertebral artery. Mild narrowing distal right vertebral artery. Moderate to marked narrowing mid aspect basilar artery. Nonvisualized anterior inferior cerebellar arteries. Narrowing and irregularity posterior inferior cerebellar arteries more notable on the left. Marked tandem stenoses posterior cerebral arteries more notable on the left. Moderate to marked narrowing left internal carotid artery cavernous and supraclinoid segment Marked narrowing mid aspect M1 segment right middle cerebral artery. 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 Moderate to marked narrowing of portions of the A2 segment of the anterior cerebral artery bilaterally. Mild to moderate narrowing middle cerebral artery branch vessels bilaterally.    PHYSICAL EXAM Pleasant middle aged lady not in distress. . Afebrile. Head is nontraumatic. Neck is supple without bruit.    Cardiac exam no murmur or gallop.  Lungs are clear to auscultation. Distal pulses are well felt. Neurological Exam :  Awake alert oriented 2. Diminished attention, registration and recall. Follows three-step commands. No aphasia, dysarthria. Extraocular movements are full range without nystagmus. Vision acuity seems adequate. Fundi were not visualized. Pupils are equal reactive. Visual fields seem full to bedside confrontational testing. Face appears symmetric without weakness. Tongue is midline. Motor system exam reveals no upper or lower extremity drift. Symmetric and equal strength except for mild weakness of left grip and intrinsic hand muscles. Orbits right over left upper extremity. Symmetric lower extremity strength. Touch pinprick sensation is preserved bilaterally. Deep tendon reflexes are 2+ symmetric. Plantars are downgoing. Coordination is slow but accurate. Gait was not tested. ASSESSMENT/PLAN Ms. Kelly Chavez is a 69 y.o. female with history of HTN, HIV positive complicated by PCP, colon cancer s/p colectomy, right breast cancer s/p mastectomy presenting with dysarthria, left face weakness. She did not receive IV t-PA due to out of the window.   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  Resultant  Left UE hemiparesis, L facial weakness  MRI  L pontine infarct  MRA  Diffuse intracranial atherosclerosis  Carotid Doppler  pending   2D Echo  pending   LDL 140  HgbA1c pending  Heparin 5000 units sq tid for VTE prophylaxis Diet Heart Room service appropriate?: Yes; Fluid consistency:: Thin  No antithrombotic prior to admission, now on aspirin 325 mg daily  Patient counseled to be compliant with her antithrombotic medications  Ongoing aggressive stroke risk factor management  Therapy recommendations:  OP ST  Disposition:  Anticipate return home  Hyperlipidemia  Home meds:  No statin  LDL 140, goal < 70  New lipitor 40 mg daily  Continue statin at  discharge  Other Stroke Risk Factors  Advanced age  Obesity, Body mass index is 30.05 kg/(m^2).   HIV  Other Active Problems  Paranoia  Hx benign brain tumor  Hx colon ca s/p OR, chemo  Hx R breast cancer  Hospital day # [place] for Pager information 09/14/2015 12:53 PM  I have personally examined this patient, reviewed notes, independently viewed imaging studies, participated in medical decision making and plan of care. I have made any additions or clarifications directly to the above note. Agree with note above. She presented with transient confusion, slurred speech and facial weakness due to left lateral pontine infarct etiology likely small vessel disease. She remains at risk for neurological worsening, recurrent stroke, TIA needs ongoing stroke evaluation. Continue aspirin for stroke prevention and add statin for elevated lipids. No family available at bedside  Antony Contras, [place] Pager: [phone] 09/14/2015 1:01 PM    To contact Stroke Continuity provider, please refer to [email or web address] After hours, contact General Neurology

## 2015-09-14 NOTE — Progress Notes (Signed)
Occupational Therapy Evaluation Patient Details Name: Kelly Chavez MRN: [id number] DOB: [birth year] Today's Date: 09/14/2015    History of Present Illness 69 y.o. admitted with confusion and Lt sided weakness. 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 Pt's son observed that pt is weaker, requires more assistance and dragging L foot more than usual. Pt is a high fall risk and reported having a fall on Sunday (09/12/15) but she was able to recover without calling for assistance - pt also does not have a medical alert device. Pt lives alone and has no family or friends nearby that could assist her if needed. Pt and son would also like a referral for [facility] RN or CNA to assist pt with ADL/IADL.Pt would benefit significantly from [facility] upon d/c for fall risk evaluation and to increase safety and independence with ADL and IADL.     Follow Up Recommendations  Home health OT;Supervision - Intermittent    Equipment Recommendations  None recommended by OT    Recommendations for Other Services Other (comment) (Home Health nurse)     Precautions / Restrictions Precautions Precautions: Fall Restrictions Weight Bearing Restrictions: No      Mobility Bed Mobility Overal bed mobility: Needs Assistance Bed Mobility: Supine to Sit     Supine to sit: Mod assist     General bed mobility comments: HOB flat, mod assist to regain balance from posterior lean and to elevate/support trunk to come to sitting EOB  Transfers Overall transfer level: Needs assistance Equipment used: Rolling walker (2 wheeled) Transfers: Sit to/from Stand Sit to Stand: Min guard         General transfer comment: Pt has decreased hip flexion and has compensatory strategy to come to standing by leveraging legs against furniture and uses SPC to stand. Pt  required min guard assist for safety due to posterior lean and inability to move hand from bed to RW.    Balance Overall balance assessment: Needs assistance Sitting-balance support: Bilateral upper extremity supported;Feet supported Sitting balance-Leahy Scale: Poor Sitting balance - Comments: Posterior lean while seated EOB and required min assist to regain balance   Standing balance support: Bilateral upper extremity supported;During functional activity Standing balance-Leahy Scale: Poor                              ADL Overall ADL's : Needs assistance/impaired     Grooming: Wash/dry hands;Min guard;Standing                   Toilet Transfer: Min guard;Ambulation;BSC;RW   Toileting- Water quality scientist and Hygiene: Min guard;Sit to/from stand       Functional mobility during ADLs: Min guard;Rolling walker General ADL Comments: Pt demonstrating increased weakness and required more assistance than her baseline for transfers according to son. Pt was min guard for ADL and mobility due to increased weakness on L side. Pt had 1 LOB while completing pericare in bathroom and required phyiscal assist to regain balance. Pt demonstrated safe hand placement on seated surfaces during ADL transfers, but pulls up on RW and SPC to come to standing due to significant weakness in her LE. Pt reported that some of her ADL and IADL are becoming more difficult to do (because of she is getting older) and both son and  pt believe she could benefit from [facility] CNA or PCA. Discussed recommendations and options for these services. Pt and son educated on fall prevention strategies and signs and symptoms of stroke.       Vision Vision Assessment?: Yes Eye Alignment: Within Functional Limits Ocular Range of Motion: Within Functional Limits Alignment/Gaze Preference: Within Defined Limits Tracking/Visual Pursuits: Decreased smoothness of horizontal tracking Saccades: Within functional  limits Convergence: Within functional limits Visual Fields: No apparent deficits Additional Comments: Pt had diplopia and bluriness on Sunday PTA that resolved by the end of the day   Perception     Praxis      Pertinent Vitals/Pain Pain Assessment: No/denies pain     Hand Dominance Right   Extremity/Trunk Assessment Upper Extremity Assessment Upper Extremity Assessment: LUE deficits/detail LUE Deficits / Details: pt reports these changes are not new LUE Coordination: decreased fine motor   Lower Extremity Assessment Lower Extremity Assessment: LLE deficits/detail;Defer to PT evaluation LLE Deficits / Details: Son reported he though pt was dragging L foot more during session   Cervical / Trunk Assessment Cervical / Trunk Assessment: Kyphotic   Communication Communication Communication: No difficulties   Cognition Arousal/Alertness: Awake/alert Behavior During Therapy: WFL for tasks assessed/performed Overall Cognitive Status: Within Functional Limits for tasks assessed                     General Comments       Exercises       Shoulder Instructions      Home Living Family/patient expects to be discharged to:: Private residence Living Arrangements: Alone Available Help at Discharge: Neighbor;Available PRN/intermittently Type of Home: Apartment Home Access: Level entry     Home Layout: One level     Bathroom Shower/Tub: Occupational psychologist: Standard Bathroom Accessibility: Yes   Home Equipment: Shower seat - built in;Bedside commode;Cane - single point;Walker - 4 wheels      Lives With: Alone    Prior Functioning/Environment Level of Independence: Independent with assistive device(s)        Comments: RW outside and for certain things inside home; used 2 canes together into bathroom; drives; gets groceries with electric cart    OT Diagnosis: Generalized weakness   OT Problem List: Decreased strength;Decreased range of  motion;Decreased activity tolerance;Impaired balance (sitting and/or standing);Decreased coordination;Decreased safety awareness;Decreased knowledge of use of DME or AE;Decreased knowledge of precautions   OT Treatment/Interventions: Self-care/ADL training;DME and/or AE instruction;Therapeutic activities;Energy conservation;Patient/family education;Balance training    OT Goals(Current goals can be found in the care plan section) Acute Rehab OT Goals Patient Stated Goal: to go home and be independent OT Goal Formulation: With patient Time For Goal Achievement: 09/28/15 Potential to Achieve Goals: Good ADL Goals Pt Will Perform Upper Body Bathing: with modified independence;standing Pt Will Perform Lower Body Bathing: with modified independence;sit to/from stand;sitting/lateral leans;with adaptive equipment Pt Will Perform Upper Body Dressing: with modified independence;sitting Pt Will Perform Lower Body Dressing: with modified independence;sit to/from stand;sitting/lateral leans Pt Will Perform Tub/Shower Transfer: Shower transfer;with modified independence;ambulating;rolling walker;shower seat  OT Frequency: Min 2X/week   Barriers to D/C: Decreased caregiver support  Pt's son lives in [place] and pt has no other family/friends that can assist her if needed other than neighbors occassionally       Co-evaluation              End of Session Equipment Utilized During Treatment: Orthoptist Nurse Communication: Mobility status  Activity Tolerance: Patient  tolerated treatment well Patient left: in chair;with call bell/phone within reach;with chair alarm set;with family/visitor present   Time: [id number] OT Time Calculation (min): 31 min Charges:  OT General Charges $OT Visit: 1 Procedure OT Evaluation $Initial OT Evaluation Tier I: 1 Procedure OT Treatments $Self Care/Home Management : 8-22 mins G-Codes:    Redmond Baseman 2015-10-12, 5:09 PM

## 2015-09-15 ENCOUNTER — Other Ambulatory Visit: Payer: No Typology Code available for payment source

## 2015-09-15 ENCOUNTER — Other Ambulatory Visit: Payer: Self-pay

## 2015-09-15 ENCOUNTER — Ambulatory Visit (HOSPITAL_COMMUNITY): Payer: Medicare Other

## 2015-09-15 ENCOUNTER — Ambulatory Visit: Payer: No Typology Code available for payment source | Admitting: Oncology

## 2015-09-15 DIAGNOSIS — I639 Cerebral infarction, unspecified: Secondary | ICD-10-CM

## 2015-09-15 DIAGNOSIS — I6302 Cerebral infarction due to thrombosis of basilar artery: Secondary | ICD-10-CM | POA: Insufficient documentation

## 2015-09-15 LAB — HEMOGLOBIN A1C
Hgb A1c MFr Bld: 6 % — ABNORMAL HIGH (ref 4.8–5.6)
MEAN PLASMA GLUCOSE: 126 mg/dL

## 2015-09-15 MED ORDER — HEPARIN SOD (PORK) LOCK FLUSH 100 UNIT/ML IV SOLN
500.0000 [IU] | INTRAVENOUS | Status: AC | PRN
  Administered 2015-09-15: 500 [IU]

## 2015-09-15 MED ORDER — ASPIRIN EC 325 MG PO TBEC: 325.0000 mg | DELAYED_RELEASE_TABLET | Freq: Every day | ORAL | Status: DC

## 2015-09-15 MED ORDER — ATORVASTATIN CALCIUM 40 MG PO TABS: 40.0000 mg | ORAL_TABLET | Freq: Every day | ORAL | Status: DC

## 2015-09-15 NOTE — Care Management Note (Signed)
Case Management Note  Patient Details  Name: Kelly Chavez MRN: [id number] Date of Birth: 1946-06-19  Subjective/Objective:                    Action/Plan: Patient discharging home today with home health orders. CM met with the patient and her son and provided them a list of home health agencies in the [facility] area. [name] chose [facility]. [name] with [facility] notified and accepted the referral. Patient is also without a PCP. She states she is new to the area and sees Dr Baxter Flattery for ID. Patient asked if she would like to be referred to the [facility] who is taking overflow for the [facility] but she states she saw an MD there once and did not want to return. CM provided the patient the [employer] number to assist her in finding an MD in her area with her insurance. CM also called Dr Alvira Philips office to see if they had a PCP referral for the patient and they stated they would refer her to [facility] also. Dr Algis Liming aware. Bedside RN updated.   Expected Discharge Date:                  Expected Discharge Plan:  [facility]  In-House Referral:     Discharge planning Services  CM Consult  Post Acute Care Choice:  Home Health Choice offered to:  Patient  DME Arranged:    DME Agency:     HH Arranged:  RN, OT, Speech Therapy HH Agency:  [facility]  Status of Service:  Completed, signed off  Medicare Important Message Given:    Date Medicare IM Given:    Medicare IM give by:    Date Additional Medicare IM Given:    Additional Medicare Important Message give by:     If discussed at [place] of Stay Meetings, dates discussed:    Additional Comments:  Pollie Friar, RN 09/15/2015, 3:19 PM

## 2015-09-15 NOTE — Progress Notes (Signed)
Patient d/c home, d/c instructions given and patient verbalized understanding. Patient left unit with all her belongings. Patient signed consent for EMMI.

## 2015-09-15 NOTE — Discharge Summary (Signed)
Physician Discharge Summary  Kelly Chavez [id number] DOB: 11-Feb-1946 DOA: 09/13/2015  PCP: [name], MD  Admit date: 09/13/2015 Discharge date: 09/15/2015  Time spent: Greater than 30 minutes  Recommendations for Outpatient Follow-up:  1. Dr. [name], ID/PCP in 2 weeks with repeat labs (CBC & CMP). 2. Neurology/stroke: MDs office will arrange follow-up appointment in 2 months. 3. Home health OT, RN and ST  Discharge Diagnoses:  Principal Problem:   CVA (cerebral infarction) Active Problems:   HIV disease ([place])   Paranoia ([place])   Acute CVA (cerebrovascular accident) ([place])   Cerebral infarction due to thrombosis of basilar artery ([place])   Discharge Condition: Improved & Stable  Diet recommendation: Heart healthy diet.  Filed Weights   09/13/15 1230 09/13/15 2017  Weight: 75.751 kg (167 lb) 81.9 kg (180 lb 8.9 oz)    History of present illness:  69 year old female patient with history of HTN, HIV positive complicated by PCP, colon cancer status post colectomy, right breast cancer status post mastectomy, presented to [facility] ED with 12 hours of confusion, disorientation, reported left-sided weakness and slurred speech. She was last known well 09/12/15-unable to determine. She was admitted for further evaluation and management of stroke.  Hospital Course:   Stroke - Neurology was consulted and patient completed stroke workup. 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 Vertebral artery flow is antegrade. - 2-D echo: LVEF 55-60 percent and grade 2 diastolic dysfunction. - LDL 140 - Hemoglobin A1c: 6 - Not on antithrombotic prior to admission. Now on aspirin 325 MG daily. - Therapies have evaluated and recommend home health OT and ST.  Patient requests RN for medication management. - Discussed with neurology/Dr. Leonie Man who has cleared patient for discharge. As per neurology, okay to travel to [place] by air in 2 weeks. - Out of window for TPA on admission.  Hyperlipidemia - LDL 140, goal <70 - Not on statin prior to discharge. Now on Lipitor 40 MG daily.  HIV complicated by PCP - Continue HAART and outpatient follow-up with infectious disease  Essential hypertension - Reasonably controlled. Continue atenolol.  History of paranoia - Not on medications at home. No agitation. No paranoia demonstrated on today's interview and exam.  History of colon cancer status post colectomy and breast cancer status post right mastectomy  Mild anemia - Follow CBCs in a couple of weeks as outpatient.   Consultations:  Neurology  Procedures:  2-D echo 09/14/15: Study Conclusions  - Left ventricle: The cavity size was normal. There was mild focal basal hypertrophy of the septum. Systolic function was normal. The estimated ejection fraction was in the range of 55% to 60%. Wall motion was normal; there were no regional wall motion abnormalities. Features are consistent with a pseudonormal left ventricular filling pattern, with concomitant abnormal relaxation and increased filling pressure (grade 2 diastolic dysfunction). - Left atrium: The atrium was mildly dilated.  Impressions:  - Normal LV systolic function; grade 2 diastolic dysfunction; mild LAE; trace MR; mild TR.   Discharge Exam:  Complaints: Patient denies complaints. No weakness reported. As per nursing, no acute events.  Filed Vitals:   09/15/15 0357 09/15/15 0537 09/15/15 0945 09/15/15 1328  BP: 155/61 155/61 139/66 126/55  Pulse: 58 50 64 49  Temp: 97.9 F (36.6 C) 97.8 F (36.6 C) 97.5 F (36.4 C)  97.9 F (36.6 C)  TempSrc: Oral Oral Oral Oral  Resp: 18 20 16 16   Height:      Weight:      SpO2: 98% 99% 100% 99%    General exam:  Pleasant middle-aged female sitting up comfortably in chair this morning. Respiratory system: Clear. No increased work of breathing. Cardiovascular system: S1 & S2 heard, RRR. No JVD, murmurs, gallops, clicks or pedal edema. Telemetry: SB in the 50s-SR Gastrointestinal system: Abdomen is nondistended, soft and nontender. Normal bowel sounds heard. Central nervous system: Alert and oriented. No focal neurological deficits. Extremities: Symmetric 5 x 5 power except mild weakness of left grip. Psychiatry: Pleasant, cooperative and appropriate.  Discharge Instructions      Discharge Instructions    Ambulatory referral to Neurology    Complete by:  As directed   Stroke office f/u in 2 months     Call MD for:    Complete by:  As directed   Strokelike symptoms.     Diet - low sodium heart healthy    Complete by:  As directed      Increase activity slowly    Complete by:  As directed             Medication List    STOP taking these medications        HYDROcodone-acetaminophen 5-325 MG tablet  Commonly known as:  NORCO/VICODIN      TAKE these medications        aspirin EC 325 MG tablet  Take 1 tablet (325 mg total) by mouth daily.     atenolol 50 MG tablet  Commonly known as:  TENORMIN  TAKE 1 TABLET BY MOUTH EVERY DAY     atorvastatin 40 MG tablet  Commonly known as:  LIPITOR  Take 1 tablet (40 mg total) by mouth daily at 6 PM.     chlorhexidine gluconate 0.12 % solution  Commonly known as:  PERIDEX  Use as directed 15 mLs in the mouth or throat 2 (two) times daily.     Darunavir Ethanolate 800 MG tablet  Commonly known as:  PREZISTA  Take 1 tablet (800 mg total) by mouth daily.     emtricitabine-tenofovir 200-300 MG tablet  Commonly known as:  TRUVADA  Take 1 tablet by mouth daily.     lidocaine-prilocaine cream  Commonly known as:  EMLA  APPLY 1 APPLICATION TOPICALLY AS NEEDED.     ritonavir 100 MG Tabs tablet  Commonly known as:  NORVIR  Take 1 tablet (100  mg total) by mouth daily.       Follow-up Information    Follow up with [name], MD. Schedule an appointment as soon as possible for a visit in 2 weeks.   Specialty:  Infectious Diseases   Why:  to be seen with repeat labs (CBC & CMP).   [address] [place] [address] [phone]        The results of significant diagnostics from this hospitalization (including imaging, microbiology, ancillary and laboratory) are listed below for reference.    Significant Diagnostic Studies: Ct Head Wo Contrast  09/13/2015  CLINICAL DATA:  Altered mental status.  Confusion.  Slurred speech. EXAM: CT HEAD WITHOUT CONTRAST TECHNIQUE: Contiguous axial images were obtained from the base of the skull through the vertex without intravenous contrast. COMPARISON:  None. FINDINGS: Diffusely enlarged ventricles and subarachnoid spaces. Patchy white matter low density in both cerebral hemispheres, midbrain and  pons. No intracranial hemorrhage, mass lesion or CT evidence of acute infarction. Right sphenoid sinus mucosal thickening. Diffuse hyperostosis of the skull with calcification of the interhemispheric falx. IMPRESSION: 1. Small vessel ischemic changes in the white matter of both cerebral hemispheres, midbrain and pons. This is most likely chronic. 2. Mild diffuse cerebral and cerebellar atrophy. 3. Moderate chronic right sphenoid sinusitis. Electronically Signed   By: Claudie Revering M.D.   On: 09/13/2015 13:36   Mr Brain Wo Contrast  09/13/2015  CLINICAL DATA:  69 year old female with 12 hours of confusion and disorientation. Generalized weakness worse on the left. Left facial droop. Slurred speech. HIV positive. Hypertension. 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 Breast cancer post radical mastectomy August 2015. Subsequent encounter. EXAM: MRI HEAD WITHOUT CONTRAST MRA HEAD WITHOUT CONTRAST TECHNIQUE: Multiplanar, multiecho pulse sequences of the brain and  surrounding structures were obtained without intravenous contrast. Angiographic images of the head were obtained using MRA technique without contrast. COMPARISON:  09/13/2015 head CT. FINDINGS: MRI HEAD FINDINGS Acute nonhemorrhagic left pons infarct. Small vessel disease type changes. Global atrophy without hydrocephalus. Prominent ossification of the falx and hyperostosis of the calvarium. Exophthalmos. No intracranial mass lesion noted on this unenhanced exam. Expanded partially empty sella without flattening of the posterior globes as seen with pseudotumor cerebri. Major intracranial vascular structures are patent. Mild spinal stenosis C4-5 and C5-6. Cervical medullary junction and pineal region unremarkable. Minimal mucosal thickening ethmoid sinus air cells. MRA HEAD FINDINGS Mild to moderate narrowing distal left vertebral artery. Mild narrowing distal right vertebral artery. Moderate to marked narrowing mid aspect basilar artery. Nonvisualized anterior inferior cerebellar arteries. Narrowing and irregularity posterior inferior cerebellar arteries more notable on the left. Marked tandem stenoses posterior cerebral arteries more notable on the left. Moderate to marked narrowing left internal carotid artery cavernous and supraclinoid segment Marked narrowing mid aspect M1 segment right middle cerebral artery. 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 Moderate to marked narrowing of portions of the A2 segment of the anterior cerebral artery bilaterally. Mild to moderate narrowing middle cerebral artery branch vessels bilaterally. No aneurysm noted. IMPRESSION: MRI HEAD Acute nonhemorrhagic left pons infarct. Small vessel disease type changes. Global atrophy without hydrocephalus. Expanded partially empty sella. Mild spinal stenosis C4-5 and C5-6. MRA HEAD Mild to moderate narrowing distal left vertebral artery. Mild narrowing distal right vertebral artery.  Moderate to marked narrowing mid aspect basilar artery. Nonvisualized anterior inferior cerebellar arteries. Narrowing and irregularity posterior inferior cerebellar arteries more notable on the left. Marked tandem stenoses posterior cerebral arteries more notable on the left. Moderate to marked narrowing left internal carotid artery cavernous and supraclinoid segment Marked narrowing mid aspect M1 segment right middle cerebral artery. 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 Moderate to marked narrowing of portions of the A2 segment of the anterior cerebral artery bilaterally. Mild to moderate narrowing middle cerebral artery branch vessels bilaterally. Electronically Signed   By: Genia Del M.D.   On: 09/13/2015 22:52   Mr [name] Head/brain Wo Cm  09/13/2015  CLINICAL DATA:  69 year old female with 12 hours of confusion and disorientation. Generalized weakness worse on the left. Left facial droop. Slurred speech. HIV positive. Hypertension. 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 Breast cancer post radical mastectomy August 2015. Subsequent encounter. EXAM: MRI HEAD WITHOUT CONTRAST MRA HEAD WITHOUT CONTRAST TECHNIQUE: Multiplanar, multiecho pulse sequences of the brain and surrounding structures were obtained without intravenous contrast. Angiographic  images of the head were obtained using MRA technique without contrast. COMPARISON:  09/13/2015 head CT. FINDINGS: MRI HEAD FINDINGS Acute nonhemorrhagic left pons infarct. Small vessel disease type changes. Global atrophy without hydrocephalus. Prominent ossification of the falx and hyperostosis of the calvarium. Exophthalmos. No intracranial mass lesion noted on this unenhanced exam. Expanded partially empty sella without flattening of the posterior globes as seen with pseudotumor cerebri. Major intracranial vascular structures are patent. Mild spinal stenosis C4-5 and C5-6. Cervical medullary junction and pineal  region unremarkable. Minimal mucosal thickening ethmoid sinus air cells. MRA HEAD FINDINGS Mild to moderate narrowing distal left vertebral artery. Mild narrowing distal right vertebral artery. Moderate to marked narrowing mid aspect basilar artery. Nonvisualized anterior inferior cerebellar arteries. Narrowing and irregularity posterior inferior cerebellar arteries more notable on the left. Marked tandem stenoses posterior cerebral arteries more notable on the left. Moderate to marked narrowing left internal carotid artery cavernous and supraclinoid segment Marked narrowing mid aspect M1 segment right middle cerebral artery. 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 Moderate to marked narrowing of portions of the A2 segment of the anterior cerebral artery bilaterally. Mild to moderate narrowing middle cerebral artery branch vessels bilaterally. No aneurysm noted. IMPRESSION: MRI HEAD Acute nonhemorrhagic left pons infarct. Small vessel disease type changes. Global atrophy without hydrocephalus. Expanded partially empty sella. Mild spinal stenosis C4-5 and C5-6. MRA HEAD Mild to moderate narrowing distal left vertebral artery. Mild narrowing distal right vertebral artery. Moderate to marked narrowing mid aspect basilar artery. Nonvisualized anterior inferior cerebellar arteries. Narrowing and irregularity posterior inferior cerebellar arteries more notable on the left. Marked tandem stenoses posterior cerebral arteries more notable on the left. Moderate to marked narrowing left internal carotid artery cavernous and supraclinoid segment Marked narrowing mid aspect M1 segment right middle cerebral artery. 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 Moderate to marked narrowing of portions of the A2 segment of the anterior cerebral artery bilaterally. Mild to moderate narrowing middle cerebral artery branch vessels  bilaterally. Electronically Signed   By: Genia Del M.D.   On: 09/13/2015 22:52    Microbiology: No results found for this or any previous visit (from the past 240 hour(s)).   Labs: Basic Metabolic Panel:  Recent Labs Lab 09/13/15 1345  NA 139  K 3.6  CL 107  CO2 25  GLUCOSE 91  BUN 19  CREATININE 0.88  CALCIUM 8.7*   Liver Function Tests:  Recent Labs Lab 09/13/15 1345  AST 30  ALT 22  ALKPHOS 89  BILITOT 0.5  PROT 7.1  ALBUMIN 3.6   No results for input(s): LIPASE, AMYLASE in the last 168 hours. No results for input(s): AMMONIA in the last 168 hours. CBC:  Recent Labs Lab 09/13/15 1345  WBC 5.5  NEUTROABS 3.0  HGB 11.8*  HCT 38.2  MCV 97.2  PLT 211   Cardiac Enzymes:  Recent Labs Lab 09/13/15 1345  TROPONINI <0.03   BNP: BNP (last 3 results) No results for input(s): BNP in the last 8760 hours.  ProBNP (last 3 results) No results for input(s): PROBNP in the last 8760 hours.  CBG:  Recent Labs Lab 09/13/15 1355  GLUCAP 90      Signed:  Vernell Leep, MD, FACP, FHM. [facility] Pager [phone]  If 7PM-7AM, please contact night-coverage [email or web address] Password [facility] 09/15/2015, 4:34 PM

## 2015-09-15 NOTE — Progress Notes (Signed)
STROKE TEAM PROGRESS NOTE   HISTORY Kelly Chavez is an 69 y.o. female with a past medical history that is relevant for HTN, HIV positive complicated by PCP, colon cancer s/p colectomy, right breast cancer s/p mastectomy, who initially presented to [place] with 12 hours of confusion, disorientation, and reported new left face weakness and slurred speech by family. Her son is at the bedside and said that yesterday he noticed that she was slurring her words, and then her left face became droopy. He got concerned because she did not improve and decided to take her to the ED at [facility]. She was LKW 09/12/2015, unable to determine time. She denies associated HA, vertigo, focal weakness or numbness, double vision, difficulty swallowing, langauge or visual impairment. CT brain showed no acute abnormality. MRI/MRA brain  demonstrated an acute left pontine infarct and moderate to marked narrowing mid aspect basilar artery respectively. Patient was not administered TPA secondary to out of the window. She was admitted for further evaluation and treatment.   SUBJECTIVE (INTERVAL HISTORY) No family is at the bedside.  Overall she feels her condition is stable. Transthoracic echo is normal but carotid Doppler is pending   OBJECTIVE Temp:  [97.5 F (36.4 C)-98.5 F (36.9 C)] 97.5 F (36.4 C) (11/30 0945) Pulse Rate:  [50-64] 64 (11/30 0945) Cardiac Rhythm:  [-] Normal sinus rhythm (11/30 0800) Resp:  [16-20] 16 (11/30 0945) BP: (125-155)/(55-66) 139/66 mmHg (11/30 0945) SpO2:  [97 %-100 %] 100 % (11/30 0945)  CBC:   Recent Labs Lab 09/13/15 1345  WBC 5.5  NEUTROABS 3.0  HGB 11.8*  HCT 38.2  MCV 97.2  PLT 123456    Basic Metabolic Panel:   Recent Labs Lab 09/13/15 1345  NA 139  K 3.6  CL 107  CO2 25  GLUCOSE 91  BUN 19  CREATININE 0.88  CALCIUM 8.7*    Lipid Panel:     Component Value Date/Time   CHOL 199 09/14/2015 0227   TRIG 129 09/14/2015 0227   HDL 33* 09/14/2015 0227   CHOLHDL  6.0 09/14/2015 0227   VLDL 26 09/14/2015 0227   LDLCALC 140* 09/14/2015 0227   HgbA1c:  Lab Results  Component Value Date   HGBA1C 6.0* 09/14/2015   Urine Drug Screen:     Component Value Date/Time   LABOPIA NONE DETECTED 09/13/2015 1600   COCAINSCRNUR NONE DETECTED 09/13/2015 1600   LABBENZ NONE DETECTED 09/13/2015 1600   AMPHETMU NONE DETECTED 09/13/2015 1600   THCU NONE DETECTED 09/13/2015 1600   LABBARB NONE DETECTED 09/13/2015 1600      IMAGING  Ct Head Wo Contrast 09/13/2015  1. Small vessel ischemic changes in the white matter of both cerebral hemispheres, midbrain and pons. This is most likely chronic. 2. Mild diffuse cerebral and cerebellar atrophy. 3. Moderate chronic right sphenoid sinusitis.   MRI HEAD 09/13/2015   Acute nonhemorrhagic left pons infarct. Small vessel disease type changes. Global atrophy without hydrocephalus. Expanded partially empty sella. Mild spinal stenosis C4-5 and C5-6.   MRA HEAD 09/13/2015   Mild to moderate narrowing distal left vertebral artery. Mild narrowing distal right vertebral artery. Moderate to marked narrowing mid aspect basilar artery. Nonvisualized anterior inferior cerebellar arteries. Narrowing and irregularity posterior inferior cerebellar arteries more notable on the left. Marked tandem stenoses posterior cerebral arteries more notable on the left. Moderate to marked narrowing left internal carotid artery cavernous and supraclinoid segment Marked narrowing mid aspect M1 segment right middle cerebral artery. Aplastic A1 segment left anterior cerebral  artery with A2 segment supplied from the right. Moderate to marked narrowing of portions of the A2 segment of the anterior cerebral artery bilaterally. Mild to moderate narrowing middle cerebral artery branch vessels bilaterally.    PHYSICAL EXAM Pleasant middle aged lady not in distress. . Afebrile. Head is nontraumatic. Neck is supple without bruit.    Cardiac exam no murmur or  gallop. Lungs are clear to auscultation. Distal pulses are well felt. Neurological Exam :  Awake alert oriented 2. Diminished attention, registration and recall. Follows three-step commands. No aphasia, dysarthria. Extraocular movements are full range without nystagmus. Vision acuity seems adequate. Fundi were not visualized. Pupils are equal reactive. Visual fields seem full to bedside confrontational testing. Face appears symmetric without weakness. Tongue is midline. Motor system exam reveals no upper or lower extremity drift. Symmetric and equal strength except for mild weakness of left grip and intrinsic hand muscles. Orbits right over left upper extremity. Symmetric lower extremity strength. Touch pinprick sensation is preserved bilaterally. Deep tendon reflexes are 2+ symmetric. Plantars are downgoing. Coordination is slow but accurate. Gait was not tested. ASSESSMENT/PLAN Kelly Chavez is a 69 y.o. female with history of HTN, HIV positive complicated by PCP, colon cancer s/p colectomy, right breast cancer s/p mastectomy presenting with dysarthria, left face weakness. She did not receive IV t-PA due to out of the window.   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  Resultant  Left UE hemiparesis, L facial weakness  MRI  L pontine infarct  MRA  Diffuse intracranial atherosclerosis  Carotid Doppler  pending   2D Echo  Left ventricle: The cavity size was normal. There was mild focal basal hypertrophy of the septum. Systolic function was normal. The estimated ejection fraction was in the range of 55% to 60%. Wall motion was normal; there were no regional wall motion abnormalities.  LDL 140  HgbA1c 6.0  Heparin 5000 units sq tid for VTE prophylaxis Diet Heart Room service appropriate?: Yes; Fluid consistency:: Thin  No antithrombotic prior to admission, now on aspirin 325 mg daily  Patient counseled to be compliant with her  antithrombotic medications  Ongoing aggressive stroke risk factor management  Therapy recommendations:  OP ST  Disposition:  Anticipate return home  Hyperlipidemia  Home meds:  No statin  LDL 140, goal < 70  New lipitor 40 mg daily  Continue statin at discharge  Other Stroke Risk Factors  Advanced age  Obesity, Body mass index is 30.05 kg/(m^2).   HIV  Other Active Problems  Paranoia  Hx benign brain tumor  Hx colon ca s/p OR, chemo  Hx R breast cancer  Hospital day # [place] for Pager information 09/15/2015 12:23 PM   . Continue aspirin for stroke prevention and add statin for elevated lipids. No family available at bedside. Discharge home after carotid Dopplers. Follow-up as an outpatient in stroke clinic in 2 months  Antony Contras, [place] Pager: [phone] 09/15/2015 12:23 PM    To contact Stroke Continuity provider, please refer to [email or web address] After hours, contact General Neurology

## 2015-09-15 NOTE — Progress Notes (Signed)
Occupational Therapy Treatment Patient Details Name: Kelly Chavez MRN: [id number] DOB: 05-18-1946 Today's Date: 09/15/2015    History of present illness 69 y.o. admitted with confusion and Lt sided weakness. 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 Making gains with skilled OT goals.  Completed all aspects of toileting along with grooming tasks in sitting and standing.  Discussed with me that she has been thinking and is interested in learning about options for help at home.  Follow Up Recommendations  Home health OT;Supervision - Intermittent, pt. States she is interested in learning about options for in home assistance and help    Equipment Recommendations  None recommended by OT    Recommendations for Other Services      Precautions / Restrictions Precautions Precautions: Fall       Mobility Bed Mobility               General bed mobility comments: in b.room upon arrival to room  Transfers Overall transfer level: Needs assistance Equipment used: Rolling walker (2 wheeled) Transfers: Sit to/from [facility] Sit to Stand: Min guard Stand pivot transfers: Min guard       General transfer comment: Pt has decreased hip flexion and has compensatory strategy to come to sitting  by leveraging legs against furniture    Balance                                   ADL Overall ADL's : Needs assistance/impaired     Grooming: Standing;Sitting Grooming Details (indicate cue type and reason): had pt. complete grooming tasks and oral care in sitting as part of energy conservation education and demonstration (had just completed ub/lb bathing prior to arriaval with sit/stand at sink), pt. completed first part sitting then requested to get back up and stand at sink to clean max. [place]                 Toilet Transfer: Min  guard;Ambulation;BSC;RW   Toileting- Water quality scientist and Hygiene: Min guard;Sit to/from stand Toileting - Clothing Manipulation Details (indicate cue type and reason): able to pull up disposable under garments with min guard A       General ADL Comments: pt. completing all aspects of toileting with min guard A.  grooming and oral care with set up sitting.  pt. states "aren't you the one that talked to me already about having help at home, ive been thinking and i def. want that".  reviewed my co-worker had discuessed that with her yesterday.  agree with the recommendation      Vision                     Perception     Praxis      Cognition   Behavior During Therapy: [facility] for tasks assessed/performed Overall Cognitive Status: Within Functional Limits for tasks assessed                       Extremity/Trunk Assessment               Exercises     Shoulder Instructions       General Comments      Pertinent Vitals/ Pain       Pain Assessment: No/denies pain  Home Living  Prior Functioning/Environment              Frequency Min 2X/week     Progress Toward Goals  OT Goals(current goals can now be found in the care plan section)  Progress towards OT goals: Progressing toward goals     Plan Discharge plan remains appropriate    Co-evaluation                 End of Session Equipment Utilized During Treatment: Gait belt;Rolling walker   Activity Tolerance Patient tolerated treatment well   Patient Left in chair;with call bell/phone within reach   Nurse Communication Other (comment) (CNA reports pt. completed ub/lb bathing standing at the sink)        Time: 1052-1130 OT Time Calculation (min): 38 min  Charges: OT General Charges $OT Visit: 1 Procedure OT Treatments $Self Care/Home Management : 38-52 mins  Janice Coffin, COTA/L 09/15/2015, 11:29  AM

## 2015-09-15 NOTE — Progress Notes (Signed)
VASCULAR LAB PRELIMINARY  PRELIMINARY  PRELIMINARY  PRELIMINARY  Carotid duplex completed.    Preliminary report:  Bilateral:  1-39% ICA stenosis.  Vertebral artery flow is antegrade.     Kelly Chavez, [place], RVS 09/15/2015, 3:05 PM

## 2015-09-15 NOTE — Progress Notes (Signed)
MD notified that patient had competed Vas US carotid.

## 2015-09-15 NOTE — Progress Notes (Signed)
Called vascular about patient's vascular US carotid. Inform them that patient is pending d/c.

## 2015-09-15 NOTE — Discharge Instructions (Signed)
Stroke Prevention Some medical conditions and behaviors are associated with an increased chance of having a stroke. You may prevent a stroke by making healthy choices and managing medical conditions. HOW CAN I REDUCE MY RISK OF HAVING A STROKE?   Stay physically active. Get at least 30 minutes of activity on most or all days.  Do not smoke. It may also be helpful to avoid exposure to secondhand smoke.  Limit alcohol use. Moderate alcohol use is considered to be:  No more than 2 drinks per day for men.  No more than 1 drink per day for nonpregnant women.  Eat healthy foods. This involves:  Eating 5 or more servings of fruits and vegetables a day.  Making dietary changes that address high blood pressure (hypertension), high cholesterol, diabetes, or obesity.  Manage your cholesterol levels.  Making food choices that are high in fiber and low in saturated fat, trans fat, and cholesterol may control cholesterol levels.  Take any prescribed medicines to control cholesterol as directed by your health care provider.  Manage your diabetes.  Controlling your carbohydrate and sugar intake is recommended to manage diabetes.  Take any prescribed medicines to control diabetes as directed by your health care provider.  Control your hypertension.  Making food choices that are low in salt (sodium), saturated fat, trans fat, and cholesterol is recommended to manage hypertension.  Ask your health care provider if you need treatment to lower your blood pressure. Take any prescribed medicines to control hypertension as directed by your health care provider.  If you are 18-39 years of age, have your blood pressure checked every 3-5 years. If you are 40 years of age or older, have your blood pressure checked every year.  Maintain a healthy weight.  Reducing calorie intake and making food choices that are low in sodium, saturated fat, trans fat, and cholesterol are recommended to manage  weight.  Stop drug abuse.  Avoid taking birth control pills.  Talk to your health care provider about the risks of taking birth control pills if you are over 35 years old, smoke, get migraines, or have ever had a blood clot.  Get evaluated for sleep disorders (sleep apnea).  Talk to your health care provider about getting a sleep evaluation if you snore a lot or have excessive sleepiness.  Take medicines only as directed by your health care provider.  For some people, aspirin or blood thinners (anticoagulants) are helpful in reducing the risk of forming abnormal blood clots that can lead to stroke. If you have the irregular heart rhythm of atrial fibrillation, you should be on a blood thinner unless there is a good reason you cannot take them.  Understand all your medicine instructions.  Make sure that other conditions (such as anemia or atherosclerosis) are addressed. SEEK IMMEDIATE MEDICAL CARE IF:   You have sudden weakness or numbness of the face, arm, or leg, especially on one side of the body.  Your face or eyelid droops to one side.  You have sudden confusion.  You have trouble speaking (aphasia) or understanding.  You have sudden trouble seeing in one or both eyes.  You have sudden trouble walking.  You have dizziness.  You have a loss of balance or coordination.  You have a sudden, severe headache with no known cause.  You have new chest pain or an irregular heartbeat. Any of these symptoms may represent a serious problem that is an emergency. Do not wait to see if the symptoms will   go away. Get medical help at once. Call your local emergency services (911 in [place] Do not drive yourself to the hospital.   This information is not intended to replace advice given to you by your health care provider. Make sure you discuss any questions you have with your health care provider.   Document Released: 11/09/2004 Document Revised: 10/23/2014 Document Reviewed:  04/04/2013 Elsevier Interactive Patient Education 2016 Elsevier Inc.  

## 2015-09-16 ENCOUNTER — Telehealth: Payer: Self-pay | Admitting: Oncology

## 2015-09-16 ENCOUNTER — Other Ambulatory Visit: Payer: Self-pay | Admitting: *Deleted

## 2015-09-16 NOTE — Telephone Encounter (Signed)
vm full could not lv msg

## 2015-09-24 ENCOUNTER — Other Ambulatory Visit: Payer: Self-pay | Admitting: *Deleted

## 2015-09-24 DIAGNOSIS — I10 Essential (primary) hypertension: Secondary | ICD-10-CM

## 2015-09-24 MED ORDER — ATENOLOL 50 MG PO TABS: 50.0000 mg | ORAL_TABLET | Freq: Every day | ORAL | Status: DC

## 2015-10-04 ENCOUNTER — Telehealth: Payer: Self-pay

## 2015-10-04 NOTE — Telephone Encounter (Signed)
Occupational Therapist visited patient today for initial evaluation.  BP was 175/100 . Recommending therapy to start once weekly for two weeks then increase to twice weekly.  If possible she will increase visits this week but Occupational Therapist  census is low and they are working Youth worker.

## 2015-10-22 ENCOUNTER — Telehealth: Payer: Self-pay | Admitting: *Deleted

## 2015-10-22 NOTE — Telephone Encounter (Signed)
Call from [facility] who has concerns that patient is refusing about half of the home health visits. She does not think she is suicidal but has concerns about the denied visits. She also wanted to know if Dr. Baxter Flattery would be willing to sign the plan of cares as the patient does not have a PCP. Please advise Call back # [phone]

## 2015-11-29 ENCOUNTER — Encounter: Payer: Self-pay | Admitting: Neurology

## 2015-11-29 ENCOUNTER — Ambulatory Visit (INDEPENDENT_AMBULATORY_CARE_PROVIDER_SITE_OTHER): Payer: Medicare Other | Admitting: Neurology

## 2015-11-29 VITALS — BP 182/86 | HR 51 | Ht 65.0 in | Wt 187.8 lb

## 2015-11-29 DIAGNOSIS — G8110 Spastic hemiplegia affecting unspecified side: Secondary | ICD-10-CM | POA: Diagnosis not present

## 2015-11-29 NOTE — Progress Notes (Signed)
[facility] [address] [place]. [place] [address] [phone]       OFFICE FOLLOW-UP NOTE  Ms. Kelly Chavez Date of Birth:  1945-10-19 Medical Record Number:  [id number]   HPI: 70 year old lady being seen today for first office follow-up visit following hospital admission for stroke in November 2016. Kelly Chavez is an 70 y.o. 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 Her son is at the bedside and said that yesterday he noticed that she was slurring her words, and then her left face became droopy. He got concerned because she did not improve and decided to take her to the ED at [facility]. She was LKW 09/12/2015, unable to determine time. She denies associated HA, vertigo, focal weakness or numbness, double vision, difficulty swallowing, langauge or visual impairment. CT brain showed no acute abnormality. MRI/MRA brain demonstrated an acute left pontine infarct and moderate to marked narrowing mid aspect basilar artery respectively. Patient was not administered TPA secondary to out of the window. She was admitted for further evaluation and treatment. CT scan of the head on admission showed small vessel ischemic changes without acute infarct per MRI scan did show left pontine paramedian infarct. MRA of the brain showed mild to moderate narrowing of the distal left vertebral artery. Carotid ultrasound showed no significant expectoration stenosis. LDL cholesterol was elevated at 140 mg percent and hemoglobin A1c was 6.0. Patient was seen by physical occupational therapy and was discharged home with home therapy. She was started on aspirin and Lipitor 40 mg. Patient states she is not tolerating Lipitor and she has history of statin  myalgias. She is on aspirin which is tolerating well. She is finished home physical and occupation therapy but is able to walk with a wheeled walker but she still has some dragging of the left leg and some diminished fine motor skills in the left hand. She states that she is not taking blood pressure medication but her blood pressure is elevated today at 182/86 and she attributes this to anxiety as she is not quite sure why she is seeing me today. ROS:   14 system review of systems is positive for  wheezing, incontinence, joint pain, headache, numbness, weakness, decreased energy, change in appetite, sleepiness and all other systems negative  PMH:  Past Medical History  Diagnosis Date  . 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 Cancer of sigmoid colon [facility]) 2011    sigmoid colectomy   . Status post chemotherapy 2012  . Brain tumor (benign) ([place])   . Avascular necrosis of bones of both hips ([place])   . 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 HIV (human immunodeficiency virus infection) ([place])   . Hypertension   . Anxiety   . Complication of anesthesia     senitive to all  . Stroke [facility])     Social History:  Social History   Social History  . Marital Status: Widowed    Spouse Name: N/A  . Number of Children: N/A  . Years of Education: N/A   Occupational History  . Not on file.   Social History Main Topics  . Smoking status: Never Smoker   . Smokeless tobacco: Never Used  . Alcohol Use: No  . Drug Use: No  .  Sexual Activity:    Partners: Male    Birth Control/ Protection: Abstinence   Other Topics Concern  . Not on file   Social History Narrative    Medications:   Current Outpatient Prescriptions on File Prior to Visit  Medication Sig Dispense Refill  . aspirin EC 325 MG tablet Take 1 tablet (325 mg total) by mouth daily. 30 tablet 0  . atenolol (TENORMIN) 50 MG tablet Take 1 tablet (50 mg total) by mouth daily. 90 tablet 1  . chlorhexidine  gluconate (PERIDEX) 0.12 % solution Use as directed 15 mLs in the mouth or throat 2 (two) times daily. 120 mL 0  . Darunavir Ethanolate (PREZISTA) 800 MG tablet Take 1 tablet (800 mg total) by mouth daily. 90 tablet 3  . emtricitabine-tenofovir (TRUVADA) 200-300 MG per tablet Take 1 tablet by mouth daily. 90 tablet 3  . lidocaine-prilocaine (EMLA) cream APPLY 1 APPLICATION TOPICALLY AS NEEDED. 30 g 0  . ritonavir (NORVIR) 100 MG TABS tablet Take 1 tablet (100 mg total) by mouth daily. 90 tablet 3  . atorvastatin (LIPITOR) 40 MG tablet Take 1 tablet (40 mg total) by mouth daily at 6 PM. (Patient not taking: Reported on 11/29/2015) 30 tablet 0   No current facility-administered medications on file prior to visit.    Allergies:  No Known Allergies  Physical Exam General: well developed, well nourished obese middle-aged Caucasian lady, seated, in no evident distress Head: head normocephalic and atraumatic.  Neck: supple with no carotid or supraclavicular bruits Cardiovascular: regular rate and rhythm, no murmurs Musculoskeletal: no deformity Skin:  no rash/petichiae Vascular:  Normal pulses all extremities Filed Vitals:   11/29/15 1352  BP: 182/86  Pulse: 51   Neurologic Exam Mental Status: Awake and fully alert. Oriented to place and time. Recent and remote memory intact. Attention span, concentration and fund of knowledge appropriate. Mood and affect appropriate.  Cranial Nerves: Fundoscopic exam reveals sharp disc margins. Pupils equal, briskly reactive to light. Extraocular movements full without nystagmus. Visual fields full to confrontation. Hearing intact. Facial sensation intact. Face, tongue, palate moves normally and symmetrically.  Motor: Normal bulk and tone. Normal strength in  right extremity muscles. Mild left hemiparesis with 4/5 strength with weakness of left grip and intrinsic hand muscles. Orbits right over left approximately. Increased tone in the left leg with mild  spasticity. Sensory.: intact to touch ,pinprick .position and vibratory sensation.  Coordination: Rapid alternating movements are diminished on the left side and normal on the right.  Gait and Station: Arises from chair with mild  difficulty. Stance is broad-based. Uses a wheeled walker with slight dragging of the left leg and stiffness.  Reflexes: 2+ and asymmetric and brisker on the left. Toes downgoing.   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 Vascular risk factors of  Hypertension and hyperlipidemia only    PLAN: I had a long d/w patient about his recent stroke, risk for recurrent stroke/TIAs, personally independently reviewed imaging studies and stroke evaluation results and answered questions.Continue aspirin 325 mg daily  for secondary stroke prevention and maintain strict control of hypertension with blood pressure goal below 130/90, diabetes with hemoglobin A1c goal below 6.5% and lipids with LDL cholesterol goal below 70 mg/dL. I also advised the patient to eat a healthy diet with plenty of whole grains, cereals, fruits and vegetables, exercise regularly and maintain ideal body weight .I will  refer her for outpatient physical and occupational therapy to improve her gait and balance.Greater than 50% of time during this 25 minute visit was spent on counseling,explanation of diagnosis, planning of further management, discussion with patient and family and coordination of care Followup in the future with me in 6 months or call earlier if necessary. Antony Contras, MD   Note: This document was prepared with digital dictation and possible smart phrase technology. Any transcriptional errors that result from this process are unintentional

## 2015-11-29 NOTE — Patient Instructions (Signed)
I had a long d/w patient about his recent stroke, risk for recurrent stroke/TIAs, personally independently reviewed imaging studies and stroke evaluation results and answered questions.Continue aspirin 325 mg daily  for secondary stroke prevention and maintain strict control of hypertension with blood pressure goal below 130/90, diabetes with hemoglobin A1c goal below 6.5% and lipids with LDL cholesterol goal below 70 mg/dL. I also advised the patient to eat a healthy diet with plenty of whole grains, cereals, fruits and vegetables, exercise regularly and maintain ideal body weight .I will refer her for outpatient physical and occupational therapy to improve her gait and balance. Followup in the future with me in 6 months or call earlier if necessary. Stroke Prevention Some medical conditions and behaviors are associated with an increased chance of having a stroke. You may prevent a stroke by making healthy choices and managing medical conditions. HOW CAN I REDUCE MY RISK OF HAVING A STROKE?   Stay physically active. Get at least 30 minutes of activity on most or all days.  Do not smoke. It may also be helpful to avoid exposure to secondhand smoke.  Limit alcohol use. Moderate alcohol use is considered to be:  No more than 2 drinks per day for men.  No more than 1 drink per day for nonpregnant women.  Eat healthy foods. This involves:  Eating 5 or more servings of fruits and vegetables a day.  Making dietary changes that address high blood pressure (hypertension), high cholesterol, diabetes, or obesity.  Manage your cholesterol levels.  Making food choices that are high in fiber and low in saturated fat, trans fat, and cholesterol may control cholesterol levels.  Take any prescribed medicines to control cholesterol as directed by your health care provider.  Manage your diabetes.  Controlling your carbohydrate and sugar intake is recommended to manage diabetes.  Take any prescribed  medicines to control diabetes as directed by your health care provider.  Control your hypertension.  Making food choices that are low in salt (sodium), saturated fat, trans fat, and cholesterol is recommended to manage hypertension.  Ask your health care provider if you need treatment to lower your blood pressure. Take any prescribed medicines to control hypertension as directed by your health care provider.  If you are 73-52 years of age, have your blood pressure checked every 3-5 years. If you are 31 years of age or older, have your blood pressure checked every year.  Maintain a healthy weight.  Reducing calorie intake and making food choices that are low in sodium, saturated fat, trans fat, and cholesterol are recommended to manage weight.  Stop drug abuse.  Avoid taking birth control pills.  Talk to your health care provider about the risks of taking birth control pills if you are over 71 years old, smoke, get migraines, or have ever had a blood clot.  Get evaluated for sleep disorders (sleep apnea).  Talk to your health care provider about getting a sleep evaluation if you snore a lot or have excessive sleepiness.  Take medicines only as directed by your health care provider.  For some people, aspirin or blood thinners (anticoagulants) are helpful in reducing the risk of forming abnormal blood clots that can lead to stroke. If you have the irregular heart rhythm of atrial fibrillation, you should be on a blood thinner unless there is a good reason you cannot take them.  Understand all your medicine instructions.  Make sure that other conditions (such as anemia or atherosclerosis) are addressed. SEEK IMMEDIATE  MEDICAL CARE IF:   You have sudden weakness or numbness of the face, arm, or leg, especially on one side of the body.  Your face or eyelid droops to one side.  You have sudden confusion.  You have trouble speaking (aphasia) or understanding.  You have sudden trouble  seeing in one or both eyes.  You have sudden trouble walking.  You have dizziness.  You have a loss of balance or coordination.  You have a sudden, severe headache with no known cause.  You have new chest pain or an irregular heartbeat. Any of these symptoms may represent a serious problem that is an emergency. Do not wait to see if the symptoms will go away. Get medical help at once. Call your local emergency services (911 in [place] Do not drive yourself to the hospital.   This information is not intended to replace advice given to you by your health care provider. Make sure you discuss any questions you have with your health care provider.   Document Released: 11/09/2004 Document Revised: 10/23/2014 Document Reviewed: 04/04/2013 Elsevier Interactive Patient Education [employer].

## 2015-12-01 ENCOUNTER — Telehealth: Payer: Self-pay | Admitting: Oncology

## 2015-12-01 NOTE — Telephone Encounter (Signed)
Returned call to patient re needing port flushed. I that last f/u was cxd due to patient being hospitalized. Not able to reach patient and left message asking that patient call me back re when she can come in for port flush and also getting back on schedule for f/u with FS.

## 2015-12-02 ENCOUNTER — Ambulatory Visit (INDEPENDENT_AMBULATORY_CARE_PROVIDER_SITE_OTHER): Payer: Medicare Other | Admitting: Internal Medicine

## 2015-12-02 ENCOUNTER — Encounter: Payer: Self-pay | Admitting: Internal Medicine

## 2015-12-02 ENCOUNTER — Telehealth: Payer: Self-pay | Admitting: *Deleted

## 2015-12-02 VITALS — BP 147/77 | HR 53 | Temp 98.1°F | Wt 180.0 lb

## 2015-12-02 DIAGNOSIS — I639 Cerebral infarction, unspecified: Secondary | ICD-10-CM

## 2015-12-02 DIAGNOSIS — B20 Human immunodeficiency virus [HIV] disease: Secondary | ICD-10-CM | POA: Diagnosis present

## 2015-12-02 DIAGNOSIS — I10 Essential (primary) hypertension: Secondary | ICD-10-CM | POA: Diagnosis not present

## 2015-12-02 DIAGNOSIS — E785 Hyperlipidemia, unspecified: Secondary | ICD-10-CM | POA: Diagnosis not present

## 2015-12-02 DIAGNOSIS — I6350 Cerebral infarction due to unspecified occlusion or stenosis of unspecified cerebral artery: Secondary | ICD-10-CM | POA: Diagnosis not present

## 2015-12-02 NOTE — Progress Notes (Signed)
Patient ID: Kelly Chavez, female   DOB: 1945-10-28, 70 y.o.   MRN: [id number]       Patient ID: Kelly Chavez, female   DOB: [birth year], 70 y.o.   MRN: [id number]  HPI 70yo F with longstanding hiv disease, CD 4 count of 500/VL<20 in November on truvada/DRVr. Hx of HTN, CVA, hx of breast ca s/p mastectomy but declined chemo. She had an acute L pontine stroke admitted to [facility] at end of November, presented with left sided weakness, AMS, slurred speech. She saw dr. Leonie Man a few days ago in follow up who recommended better BP control plus continue on asa plus lipitor. She is having difficulty getting intouc with cancer ctr. to get port flushed. She notices some increase nasal congestion but no cough or fever. Otherwise feels that she is in good health.  She has questions regarding vit d3 deficiency  Outpatient Encounter Prescriptions as of 12/02/2015  Medication Sig  . aspirin EC 325 MG tablet Take 1 tablet (325 mg total) by mouth daily.  Marland Kitchen atenolol (TENORMIN) 50 MG tablet Take 1 tablet (50 mg total) by mouth daily.  Marland Kitchen atorvastatin (LIPITOR) 40 MG tablet Take 1 tablet (40 mg total) by mouth daily at 6 PM.  . chlorhexidine gluconate (PERIDEX) 0.12 % solution Use as directed 15 mLs in the mouth or throat 2 (two) times daily.  . Darunavir Ethanolate (PREZISTA) 800 MG tablet Take 1 tablet (800 mg total) by mouth daily.  Marland Kitchen emtricitabine-tenofovir (TRUVADA) 200-300 MG per tablet Take 1 tablet by mouth daily.  Marland Kitchen lidocaine-prilocaine (EMLA) cream APPLY 1 APPLICATION TOPICALLY AS NEEDED.  Marland Kitchen ritonavir (NORVIR) 100 MG TABS tablet Take 1 tablet (100 mg total) by mouth daily.   No facility-administered encounter medications on file as of 12/02/2015.     Patient Active Problem List   Diagnosis Date Noted  . Spastic hemiplegia affecting nondominant side ([place]) 11/29/2015  . Cerebral infarction due to thrombosis of basilar artery ([place])   . Acute CVA (cerebrovascular accident) ([place]) 09/14/2015  . CVA (cerebral  infarction) 09/13/2015  . Hyperlipidemia 05/31/2014  . Pedal edema 05/11/2014  . Paranoia ([place]) 05/11/2014  . Breast cancer of upper-inner quadrant of right female breast ([place]) 04/15/2014  . History of pituitary tumor 01/28/2014  . Avascular necrosis of bones of both hips ([place]) 01/28/2014  . Genital herpes 01/28/2014  . Ovarian cyst, bilateral 01/26/2014  . Essential hypertension, benign 01/26/2014  . Unspecified vitamin D deficiency 01/26/2014  . DJD (degenerative joint disease) 01/26/2014  . HIV disease ([place]) 01/07/2014     Health Maintenance Due  Topic Date Due  . TETANUS/TDAP  10/21/1965  . COLONOSCOPY  10/21/1996  . ZOSTAVAX  10/21/2006  . DEXA SCAN  10/22/2011  . PNA vac Low Risk Adult (2 of 2 - PCV13) 01/23/2015     Review of Systems + nasal congestion, but no cough, fever. 10 point ros is otherwise negative Physical Exam   BP 147/77 mmHg  Pulse 53  Temp(Src) 98.1 F (36.7 C) (Oral)  Wt 180 lb (81.647 kg) Physical Exam  Constitutional:  oriented to person, place, and time. appears well-developed and well-nourished. No distress.  HENT: [place]/AT, PERRLA, no scleral icterus Mouth/Throat: Oropharynx is clear and moist. No oropharyngeal exudate.  Cardiovascular: Normal rate, regular rhythm and normal heart sounds. Exam reveals no gallop and no friction rub.  No murmur heard.  Pulmonary/Chest: Effort normal and breath sounds normal. No respiratory distress.  has no wheezes.  Neck = supple, no nuchal rigidity Abdominal:  Soft. Bowel sounds are normal.  exhibits no distension. There is no tenderness.  Lymphadenopathy: no cervical adenopathy. No axillary adenopathy Neurological: alert and oriented to person, place, and time. Left arm grip 3/5 with right arm grip 5/5.  Skin: Skin is warm and dry. No rash noted. No erythema.  Psychiatric: a normal mood and affect.  behavior is normal.   Lab Results  Component Value Date   CD4TCELL 26* 08/17/2015   Lab Results  Component  Value Date   CD4TABS 500 08/17/2015   CD4TABS 490 03/18/2015   CD4TABS 560 10/20/2014   Lab Results  Component Value Date   HIV1RNAQUANT <20 08/17/2015   Lab Results  Component Value Date   HEPBSAB NEG 01/22/2014   No results found for: RPR  CBC Lab Results  Component Value Date   WBC 5.5 09/13/2015   RBC 3.93 09/13/2015   HGB 11.8* 09/13/2015   HCT 38.2 09/13/2015   PLT 211 09/13/2015   MCV 97.2 09/13/2015   MCH 30.0 09/13/2015   MCHC 30.9 09/13/2015   RDW 13.6 09/13/2015   LYMPHSABS 1.8 09/13/2015   MONOABS 0.6 09/13/2015   EOSABS 0.0 09/13/2015   BASOSABS 0.0 09/13/2015   BMET Lab Results  Component Value Date   NA 139 09/13/2015   K 3.6 09/13/2015   CL 107 09/13/2015   CO2 25 09/13/2015   GLUCOSE 91 09/13/2015   BUN 19 09/13/2015   CREATININE 0.88 09/13/2015   CALCIUM 8.7* 09/13/2015   GFRNONAA >60 09/13/2015   GFRAA >60 09/13/2015     Assessment and Plan  hiv disease = well controlled. Continue on current regimen. Will check labs at next visit  Recent pontine stroke = has some residual left had weakness. Appears to compensate with right hand. Continue on asa, lipitor, and bp control. Continue to follow up with dr. Leonie Man  htn = better controlled, continue at atenolol 50mg  daily  Vit d def = can do multi-vitamin  hld = continue on lipitor  Hx of breast ca with portacath = will get visit to flush port for patient

## 2015-12-07 ENCOUNTER — Telehealth: Payer: Self-pay | Admitting: Oncology

## 2015-12-07 NOTE — Telephone Encounter (Signed)
Pt called to add flush overdue, confirmed flush D/T .Marland Kitchen.. KJ

## 2015-12-08 NOTE — Telephone Encounter (Signed)
Error

## 2015-12-09 ENCOUNTER — Ambulatory Visit (HOSPITAL_BASED_OUTPATIENT_CLINIC_OR_DEPARTMENT_OTHER): Payer: Medicare Other

## 2015-12-09 ENCOUNTER — Telehealth: Payer: Self-pay | Admitting: Oncology

## 2015-12-09 VITALS — BP 151/61 | HR 51 | Temp 98.1°F | Resp 16

## 2015-12-09 DIAGNOSIS — Z95828 Presence of other vascular implants and grafts: Secondary | ICD-10-CM

## 2015-12-09 DIAGNOSIS — C50211 Malignant neoplasm of upper-inner quadrant of right female breast: Secondary | ICD-10-CM | POA: Diagnosis present

## 2015-12-09 MED ORDER — SODIUM CHLORIDE 0.9% FLUSH
10.0000 mL | INTRAVENOUS | Status: DC | PRN
  Administered 2015-12-09: 10 mL via INTRAVENOUS
  Filled 2015-12-09: qty 10

## 2015-12-09 MED ORDER — HEPARIN SOD (PORK) LOCK FLUSH 100 UNIT/ML IV SOLN
500.0000 [IU] | Freq: Once | INTRAVENOUS | Status: AC
  Administered 2015-12-09: 500 [IU] via INTRAVENOUS
  Filled 2015-12-09: qty 5

## 2015-12-09 NOTE — Telephone Encounter (Signed)
pt req flush appt s to be sch-gave pt copy of avs

## 2015-12-09 NOTE — Patient Instructions (Signed)

## 2015-12-15 ENCOUNTER — Ambulatory Visit: Payer: Medicare Other | Admitting: Occupational Therapy

## 2015-12-15 ENCOUNTER — Encounter: Payer: Self-pay | Admitting: Occupational Therapy

## 2015-12-15 ENCOUNTER — Ambulatory Visit: Payer: Medicare Other | Attending: Neurology | Admitting: Physical Therapy

## 2015-12-15 DIAGNOSIS — R269 Unspecified abnormalities of gait and mobility: Secondary | ICD-10-CM | POA: Insufficient documentation

## 2015-12-15 DIAGNOSIS — F09 Unspecified mental disorder due to known physiological condition: Secondary | ICD-10-CM | POA: Diagnosis present

## 2015-12-15 DIAGNOSIS — R531 Weakness: Secondary | ICD-10-CM

## 2015-12-15 DIAGNOSIS — R2681 Unsteadiness on feet: Secondary | ICD-10-CM | POA: Diagnosis present

## 2015-12-15 DIAGNOSIS — R29898 Other symptoms and signs involving the musculoskeletal system: Secondary | ICD-10-CM | POA: Insufficient documentation

## 2015-12-15 DIAGNOSIS — M6281 Muscle weakness (generalized): Secondary | ICD-10-CM | POA: Diagnosis present

## 2015-12-15 DIAGNOSIS — F0789 Other personality and behavioral disorders due to known physiological condition: Secondary | ICD-10-CM | POA: Insufficient documentation

## 2015-12-15 DIAGNOSIS — I69359 Hemiplegia and hemiparesis following cerebral infarction affecting unspecified side: Secondary | ICD-10-CM | POA: Diagnosis present

## 2015-12-15 DIAGNOSIS — M25669 Stiffness of unspecified knee, not elsewhere classified: Secondary | ICD-10-CM

## 2015-12-15 NOTE — Therapy (Addendum)
[facility] [address] [place], [place], [address] Phone: [phone]   Fax:  [phone]  Physical Therapy Evaluation  Patient Details  Name: Kelly Chavez MRN: [id number] Date of Birth: 1945-11-20 Referring Provider: Garvin Fila  Encounter Date: 12/15/2015      PT End of Session - 12/15/15 1453    Visit Number 1   Number of Visits 8   Date for PT Re-Evaluation 01/12/16   PT Start Time [id number]   PT Stop Time 1358   PT Time Calculation (min) 34 min   Equipment Utilized During Treatment Gait belt   Activity Tolerance Patient tolerated treatment well   Behavior During Therapy [facility] for tasks assessed/performed      Past Medical History  Diagnosis Date  . 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 Cancer of sigmoid colon [facility]) 2011    sigmoid colectomy   . Status post chemotherapy 2012  . Brain tumor (benign) ([place])   . Avascular necrosis of bones of both hips ([place])   . Invasive ductal carcinoma of right breast ([place]) 04/09/14  . HIV (human immunodeficiency virus infection) ([place])   . Hypertension   . Anxiety   . Complication of anesthesia     senitive to all  . Stroke [facility])     Past Surgical History  Procedure Laterality Date  . Tonsillectomy    . Total abdominal hysterectomy w/ bilateral salpingoophorectomy  2012  . Colon surgery      partial colectomy  . Mastectomy modified radical Right 06/15/2014    Procedure: RIGHT MODIFIED RADICAL MASTECTOMY;  Surgeon: Shann Medal, MD;  Location: [facility];  Service: General;  Laterality: Right;  . Portacath placement N/A 06/15/2014    Procedure: INSERTION PORT-A-CATH LEFT SUBCLAVIAN;  Surgeon: Shann Medal, MD;  Location: [facility];  Service: General;  Laterality: N/A;  . Abdominal hysterectomy      There were no vitals filed for this visit.  Visit Diagnosis:  Abnormality of gait - Plan: PT plan of care cert/re-cert  Decreased ROM of lower extremity - Plan: PT  plan of care cert/re-cert      Subjective Assessment - 12/15/15 1328    Subjective Pt is a 70 y/o female who presents to [facility] s/p L pontine CVA in Nov 2016.  Pt presents today with c/o anxiety and some mild R sided weakness/decreased sensation.  Pt denies RLE weakness.  Pt reports she feels gait is at baseline.   Pertinent History HIV+, Breast Ca, benign brain tumor s/p resection, HTN, anxiety, Colon Ca, AVN bil hips (4-5 yr hx)   Patient Stated Goals "the walking and feel better in my back"   Currently in Pain? Yes   Pain Score 4    Pain Location Hip   Pain Orientation Right;Left   Pain Descriptors / Indicators Aching   Pain Type Chronic pain   Pain Onset More than a month ago   Pain Frequency Intermittent   Aggravating Factors  sitting   Pain Relieving Factors movement   Effect of Pain on Daily Activities will monitor however will not directly address            [facility] PT Assessment - 12/15/15 1335    Assessment   Medical Diagnosis CVA   Referring Provider Garvin Fila   Onset Date/Surgical Date 09/13/15   Hand Dominance Right   Prior Therapy 1x visit in hospital; PT for back after falling in snow 4-5 years ago  Precautions   Precautions Fall   Restrictions   Weight Bearing Restrictions No   Balance Screen   Has the patient fallen in the past 6 months No   Has the patient had a decrease in activity level because of a fear of falling?  No   Is the patient reluctant to leave their home because of a fear of falling?  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    Cognition   Overall Cognitive Status No family/caregiver present to determine baseline cognitive functioning   Behaviors Other (comment)   Observation/Other Assessments   Observations pt demonstrates some paranoid behaviors: states MD's are "using me as a [place] pig" and concerned neighbors in [place] can go through attic to get into house   AROM   Overall AROM Comments bil hip flex limited to 55 degrees   Strength   Overall Strength Comments tested in sitting: bil hip flex 5/5 (within available range); bil knee flex and ext 5/5; dorsiflexion bil 5/5   Transfers   Transfers Sit to Stand;Stand to Sit   Sit to Stand 6: Modified independent (Device/Increase time);With upper extremity assist   Stand to Sit 6: Modified independent (Device/Increase time);With upper extremity assist   Comments heavy reliance on UEs to stand due to limited hip flexion; pt with posterior lean to stand needing to walk feet backwards in order to achieve upright position; uncontrolled posterior lean to descend   Ambulation/Gait   Ambulation/Gait Yes   Ambulation/Gait Assistance 6: Modified independent (Device/Increase time)   Ambulation Distance (Feet) 75 Feet   Assistive device 4-wheeled walker   Gait Pattern Decreased hip/knee flexion - right;Decreased hip/knee flexion - left;Right circumduction;Left circumduction  due to decr ROM at hips   Ambulation Surface Level;Indoor   Gait velocity 1.01 ft/sec  32.41   Standardized Balance Assessment   Standardized Balance Assessment Timed Up and Go Test   Timed Up and Go Test   Normal TUG (seconds) 56.84                           PT Education - 12/15/15 1453    Education provided Yes   Education Details clinical findings and POC   Person(s) Educated Patient   Methods Explanation   Comprehension Verbalized understanding             PT Long Term Goals - 12/15/15 1500    PT LONG TERM GOAL #1   Title independent with HEP (01/12/16)    Status New   PT LONG TERM GOAL #2   Title improve timed up and go to < 50 sec for improved mobility (01/12/16)   Status New   PT LONG TERM GOAL #3   Title improve gait velocity to > 1.3 ft/sec for improved mobility and community access (01/12/16)   Status New   PT LONG TERM GOAL #4   Title perform BERG with goal to be written (01/12/16)   Status New  Plan - 12/23/2015 1454    Clinical Impression Statement Pt is a 70 y/o female who presents to OPPT for moderate complexity evaluation s/p L pontine CVA.  Pt with extensive PMH and decreased ROM bil hip flexion affecting safe functional mobility.  Decreased ROM due to AVN bil hips and therefore unlikely to change.  Feel pt is at or near baseline for mobility, but is high fall risk as indicated by TUG and gait velocity.  Will plan to see 2x/wk x 4 wks to address balance.    Pt will benefit from skilled therapeutic intervention in order to improve on the following deficits Abnormal gait;Decreased balance;Decreased range of motion;Decreased mobility   Rehab Potential Fair   PT Frequency 2x / week   PT Duration 4 weeks   PT Treatment/Interventions ADLs/Self Care Home Management;Therapeutic activities;Therapeutic exercise;Functional mobility training;Stair training;Gait training;Balance training;Neuromuscular re-education;Patient/family education   PT Next Visit Plan BERG; balance HEP   Consulted and Agree with Plan of Care Patient          G-Codes - 12-23-2015 1501    Functional Assessment Tool Used clincal judgement gait velocity 1.01 ft/sec   Functional Limitation Mobility: Walking and moving around   Mobility: Walking and Moving Around Current Status [id number]) At least 80 percent but less than 100 percent impaired, limited or restricted   Mobility: Walking and Moving Around Goal Status [phone]) At least 60 percent but less than 80 percent impaired, limited or restricted       Problem List Patient Active Problem List    Diagnosis Date Noted  . Spastic hemiplegia affecting nondominant side ([place]) 11/29/2015  . Cerebral infarction due to thrombosis of basilar artery ([place])   . Acute CVA (cerebrovascular accident) ([place]) 09/14/2015  . CVA (cerebral infarction) 09/13/2015  . Hyperlipidemia 05/31/2014  . Pedal edema 05/11/2014  . Paranoia ([place]) 05/11/2014  . Breast cancer of upper-inner quadrant of right female breast ([place]) 04/15/2014  . History of pituitary tumor 01/28/2014  . Avascular necrosis of bones of both hips ([place]) 01/28/2014  . Genital herpes 01/28/2014  . Ovarian cyst, bilateral 01/26/2014  . Essential hypertension, benign 01/26/2014  . Unspecified vitamin D deficiency 01/26/2014  . DJD (degenerative joint disease) 01/26/2014  . HIV disease ([place]) 01/07/2014   Laureen Abrahams, PT, DPT 2015-12-23 3:58 PM  [facility] [address] [place], [place], [address] Phone: [phone]   Fax:  [phone]  Name: [place] Zogg MRN: [id number] Date of Birth: 04-26-1946

## 2015-12-16 NOTE — Therapy (Signed)
[facility] [address] [place], [place], [address] Phone: [phone]   Fax:  [phone]  Occupational Therapy Evaluation  Patient Details  Name: Kelly Chavez MRN: [id number] Date of Birth: 09/30/1946 Referring Provider: Dr Mamie Nick. Sethi  Encounter Date: 12/15/2015      OT End of Session - 12/16/15 2230    Visit Number 1   Number of Visits 17   Date for OT Re-Evaluation 02/16/16   Authorization Type Medicare - G code and progress note on 10th visit   Authorization - Visit Number 1   Authorization - Number of Visits 10   OT Start Time 1402   OT Stop Time 1445   OT Time Calculation (min) 43 min   Activity Tolerance Patient tolerated treatment well   Behavior During Therapy WFL for tasks assessed/performed      Past Medical History  Diagnosis Date  . 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 Cancer of sigmoid colon [facility]) 2011    sigmoid colectomy   . Status post chemotherapy 2012  . Brain tumor (benign) ([place])   . Avascular necrosis of bones of both hips ([place])   . 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 HIV (human immunodeficiency virus infection) ([place])   . Hypertension   . Anxiety   . Complication of anesthesia     senitive to all  . Stroke [facility])     Past Surgical History  Procedure Laterality Date  . Tonsillectomy    . Total abdominal hysterectomy w/ bilateral salpingoophorectomy  2012  . Colon surgery      partial colectomy  . Mastectomy modified radical Right 06/15/2014    Procedure: RIGHT MODIFIED RADICAL MASTECTOMY;  Surgeon: Shann Medal, MD;  Location: [facility];  Service: General;  Laterality: Right;  . Portacath placement N/A 06/15/2014    Procedure: INSERTION PORT-A-CATH LEFT SUBCLAVIAN;  Surgeon: Shann Medal, MD;  Location: [facility];  Service: General;  Laterality: N/A;  . Abdominal hysterectomy      There were no vitals filed for this visit.  Visit Diagnosis:   Dominant hemiplegia complicating stroke ([place]) - Plan: Ot plan of care cert/re-cert  Generalized weakness - Plan: Ot plan of care cert/re-cert  Unsteadiness on feet - Plan: Ot plan of care cert/re-cert  Cognitive and neurobehavioral dysfunction - Plan: Ot plan of care cert/re-cert         [facility] OT Assessment - 12/16/15 0001    Assessment   Diagnosis pontine stroke   Onset Date 09/11/15   Prior Therapy Home health therapy   Precautions   Precautions Fall   Restrictions   Weight Bearing Restrictions No   Home  Environment   Family/patient expects to be discharged to: Private residence   Living Arrangements Alone   Available Help at Discharge Available PRN/intermittently   Type of [place] --  single level [place] One level   Bathroom Shower/Tub Tub/Shower unit;Walk-in Shower;Curtain;Door  currently uses walk in Interior and spatial designer Yes   How accessible Accessible via walker   Adaptive equipment Reacher;Sock aid;Long-handled shoe horn   Additional Comments Wants to be able to put lotion on legs and wash feet.   Prior Function   Level of Independence Requires assistive device for independence   Vocation Retired  Pharmacist, hospital   ADL   Eating/Feeding Independent   Grooming Brushing hair  increased time difficulty with  hair care   Lower Body Bathing Minimal assistance   Where Assesed-Lower Body Bathing --  Limtied hip flexion due to avascular necrosis in both hips   Toilet Tranfer Modified independent   Tub/Shower Transfer Modified independent   Tub/Shower Transfer Equipment Walk in shower;Increased time   ADL comments Patient reports frustraition at not being able to wash feet, or apply lotion to lower legs effectively.  Patient with severely limited hip motion due to avascular necrosis    Written Expression   Handwriting 100% legible   Vision - History   Baseline Vision Wears glasses all the time    Additional Comments Reports needs follow up with eye MD   Vision Assessment   Eye Alignment Within Functional Limits   Ocular Range of Motion Within Functional Limits   Alignment/Gaze Preference Within Defined Limits   Tracking/Visual Pursuits Able to track stimulus in all quads without difficulty   Saccades Within functional limits   Activity Tolerance   Activity Tolerance Comments I get more tired now   Cognition   Overall Cognitive Status History of cognitive impairments - at baseline   Attention Selective   Awareness Appears intact   Sensation   Light Touch Appears Intact   Coordination   Gross Motor Movements are Fluid and Coordinated No   Fine Motor Movements are Fluid and Coordinated No   9 Hole Peg Test Right;Left   Right 9 Hole Peg Test 32.53   Left 9 Hole Peg Test unable   AROM   Overall AROM  Deficits   Overall AROM Comments Shoulder flexion abduction limited to 155 degrees   Strength   Overall Strength Deficits   Overall Strength Comments right shoulder flexion 4/5, abduction 4-/5   Hand Function   Right Hand Gross Grasp Impaired   Right Hand Grip (lbs) 45   Left Hand Gross Grasp Impaired   Left Hand Grip (lbs) 35   Comment Patient with life long deficits in left hand - smaller size, limited flexion in PIP's, patient unsure of rationale stating "it's always been this way"                         OT Education - 12/16/15 2228    Education provided Yes   Education Details Discussed role of OT, and potentila OT goals   Person(s) Educated Patient   Methods Explanation   Comprehension Verbalized understanding          OT Short Term Goals - 12/16/15 2239    OT SHORT TERM GOAL #1   Title Patient will complete HEP with min cueing   Baseline 4   Period Weeks   Status New   OT SHORT TERM GOAL #2   Title Patient will bathe lower egs and feet with min cueing following set up   Time 4   Period Weeks   Status New   OT SHORT TERM GOAL #3   Title  Patient will demonstrate sufficient strength to get onto and off of floor with mod assist as needed for tub transfer as desired   Time 4   Period Weeks   Status New   OT SHORT TERM GOAL #4   Title Patient will demonstrate sufficient shoulder and elbow range of motion and be able to sustain overhead functional position long enough to brush and style hair with no more than 3 rest breaks.   Time 4   Period Weeks   Status New  OT Long Term Goals - 12-20-15 01-19-42    OT LONG TERM GOAL #1   Title Patient will complete a HEP / Home activity program independently    Time 8   Period Weeks   Status New   OT LONG TERM GOAL #2   Title Patient will wash lower legs and feet with modified independce   Time 8   Period Weeks   Status New   OT LONG TERM GOAL #3   Title Patient will apply lotion to lower legs and feet with modified independence   Time 8   Period Weeks   Status New   OT LONG TERM GOAL #4   Title Patient will demonstrate adequate strength and range of motion in right and left shoulder and elbow to effectively brush and style hair into ponytail without rest breaks, and without physical assist.   Time 8   Period Weeks   Status New               Plan - 2015-12-20 2232    Clinical Impression Statement Patient is a 70 year old with recent left pontine stroke (08/2016) with resultant right, dominant hemiparesis.  Patient with complicated medical history which inclides HIV, breast cancer ([place]) with mastectomy, colon cancer, and avascular necrosis of bilateral hips.  Patiet lives alone with intermittent assistance, yet has difficulty performing some aspects of her daily self care skills independently due to severely limited range of motion in hips, decreased  selective to alternation atnetion, decreased activity tolerance, and weakness in dominant right UE   Pt will benefit from skilled therapeutic intervention in order to improve on the following deficits (Retired)  Decreased activity tolerance;Decreased balance;Decreased cognition;Decreased mobility;Decreased knowledge of use of DME;Decreased knowledge of precautions;Decreased endurance;Decreased coordination;Decreased range of motion;Decreased safety awareness;Decreased strength;Impaired perceived functional ability;Increased edema;Difficulty walking;Impaired flexibility;Impaired tone;Impaired UE functional use;Pain   Rehab Potential Fair   OT Frequency 2x / week   OT Duration 8 weeks   OT Treatment/Interventions Self-care/ADL training;Moist Heat;Fluidtherapy;Ultrasound;Therapeutic exercise;Neuromuscular education;DME and/or AE instruction;Manual Therapy;Functional Mobility Training;Splinting;Therapeutic exercises;Therapeutic activities;Cognitive remediation/compensation;Patient/family education;Balance training   Plan explore options for loer body self care - washing feet and applying lotion, begin HEP to aide in increasing shoulder flexion / abduction biomechanics and strengthening   Consulted and Agree with Plan of Care Patient          G-Codes - 2015/12/20 Jan 19, 2246    Functional Assessment Tool Used 9 hole peg test   Functional Limitation Carrying, moving and handling objects   Carrying, Moving and Handling Objects Current Status [id number]) At least 40 percent but less than 60 percent impaired, limited or restricted   Carrying, Moving and Handling Objects Goal Status [id number]) At least 20 percent but less than 40 percent impaired, limited or restricted      Problem List Patient Active Problem List   Diagnosis Date Noted  . Spastic hemiplegia affecting nondominant side ([place]) 11/29/2015  . 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 Acute CVA (cerebrovascular accident) ([place]) 09/14/2015  . CVA (cerebral infarction) 09/13/2015  . Hyperlipidemia 05/31/2014  . Pedal edema 05/11/2014  . Paranoia ([place]) 05/11/2014  . Breast cancer of upper-inner quadrant of right female breast ([place])  04/15/2014  . History of pituitary tumor 01/28/2014  . Avascular necrosis of bones of both hips ([place]) 01/28/2014  . Genital herpes 01/28/2014  . Ovarian cyst, bilateral 01/26/2014  . Essential hypertension, benign 01/26/2014  . Unspecified vitamin D deficiency 01/26/2014  . DJD (degenerative joint  disease) 01/26/2014  . HIV disease ([place]) 01/07/2014    Kelly Milling, OTR/L 12/16/2015, 10:51 PM  [facility] [address] [place], [place], [address] Phone: [phone]   Fax:  [phone]  Name: Kelly Chavez MRN: [id number] Date of Birth: 18-Feb-1946

## 2015-12-21 ENCOUNTER — Ambulatory Visit: Payer: Medicare Other | Admitting: Physical Therapy

## 2015-12-23 ENCOUNTER — Ambulatory Visit: Payer: Medicare Other | Admitting: Physical Therapy

## 2015-12-23 ENCOUNTER — Encounter: Payer: Self-pay | Admitting: Occupational Therapy

## 2015-12-23 ENCOUNTER — Ambulatory Visit: Payer: Medicare Other | Admitting: Occupational Therapy

## 2015-12-23 DIAGNOSIS — R2681 Unsteadiness on feet: Secondary | ICD-10-CM

## 2015-12-23 DIAGNOSIS — M25669 Stiffness of unspecified knee, not elsewhere classified: Secondary | ICD-10-CM

## 2015-12-23 DIAGNOSIS — R531 Weakness: Secondary | ICD-10-CM

## 2015-12-23 DIAGNOSIS — F0789 Other personality and behavioral disorders due to known physiological condition: Secondary | ICD-10-CM

## 2015-12-23 DIAGNOSIS — R269 Unspecified abnormalities of gait and mobility: Secondary | ICD-10-CM

## 2015-12-23 DIAGNOSIS — F09 Unspecified mental disorder due to known physiological condition: Secondary | ICD-10-CM

## 2015-12-23 DIAGNOSIS — I69359 Hemiplegia and hemiparesis following cerebral infarction affecting unspecified side: Secondary | ICD-10-CM

## 2015-12-23 NOTE — Patient Instructions (Signed)
ABDUCTION: Standing (Active)    Stand, feet flat. Lift right leg out to side.  Complete __1_ sets of _10__ repetitions. Perform _1-2__ sessions per day.  http://gtsc.exer.us/111   Copyright  VHI. All rights reserved.   FLEXION: Standing - Stable (Active)    Stand, both feet flat. Bend right knee, bringing heel toward buttocks.  Complete _1__ sets of _10__ repetitions. Perform _1-2__ sessions per day.  http://gtsc.exer.us/241   Copyright  VHI. All rights reserved.   Heel Raises    Stand with support. With knees straight, raise heels off ground. Hold _1-2__ seconds. Relax for _1-2__ seconds. Repeat _20__ times. Do _1-2__ times a day.  Copyright  VHI. All rights reserved.   Toe Raise (Standing)    Rock back on heels. Repeat __20__ times per set. Do __1__ sets per session. Do __1-2__ sessions per day.  http://orth.exer.us/43   Copyright  VHI. All rights reserved.

## 2015-12-23 NOTE — Therapy (Signed)
[facility] [address] [place], [place], [address] Phone: [phone]   Fax:  [phone]  Occupational Therapy Treatment  Patient Details  Name: Kelly Chavez MRN: [id number] Date of Birth: [birth year] Referring Provider: Dr Mamie Nick. Sethi  Encounter Date: 12/23/2015      OT End of Session - 12/23/15 2119    Visit Number 2   Number of Visits 17   Date for OT Re-Evaluation 02/16/16   Authorization Type Medicare - G code and progress note on 10th visit   Authorization - Visit Number 2   Authorization - Number of Visits 10   OT Start Time [id number]   OT Stop Time 1530   OT Time Calculation (min) 43 min   Activity Tolerance Patient tolerated treatment well   Behavior During Therapy [facility] for tasks assessed/performed      Past Medical History  Diagnosis Date  . 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 Cancer of sigmoid colon [facility]) 2011    sigmoid colectomy   . Status post chemotherapy 2012  . Brain tumor (benign) ([place])   . Avascular necrosis of bones of both hips ([place])   . 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 HIV (human immunodeficiency virus infection) ([place])   . Hypertension   . Anxiety   . Complication of anesthesia     senitive to all  . Stroke [facility])     Past Surgical History  Procedure Laterality Date  . Tonsillectomy    . Total abdominal hysterectomy w/ bilateral salpingoophorectomy  2012  . Colon surgery      partial colectomy  . Mastectomy modified radical Right 06/15/2014    Procedure: RIGHT MODIFIED RADICAL MASTECTOMY;  Surgeon: Shann Medal, MD;  Location: [facility];  Service: General;  Laterality: Right;  . Portacath placement N/A 06/15/2014    Procedure: INSERTION PORT-A-CATH LEFT SUBCLAVIAN;  Surgeon: Shann Medal, MD;  Location: [facility];  Service: General;  Laterality: N/A;  . Abdominal hysterectomy      There were no vitals filed for this visit.  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   Patient Stated Goals (p) I want to feel good about myself, I want to wash my feet.     Currently in Pain? (p) No/denies   Pain Score (p) 0-No pain                      OT Treatments/Exercises (OP) - 12/23/15 0001    ADLs   Grooming Patient reports fatigue in arms when attempting to brush and style her hair.  Patient agreeable to arm exercises to build strength and conditioning in right arm especially.   Bathing Reviewed adaptive equipment for bathing lower legs and feet, and also grab bars for tub - patient wants to be able to safely get up from floor of tub.     Exercises   Exercises Shoulder;Elbow   Shoulder Exercises: Seated   Flexion AROM;Strengthening;Right;10 reps;Weights   Flexion Weight (lbs) 3   Elbow Exercises   Bar Weights/Barbell (Elbow Flexion) 3 lbs   Bar Weights/Barbell (Elbow Extension) 3 lbs  OT Education - 12/23/15 2118    Education provided Yes   Education Details HEP to address shoulder flexion with elbow extension, and bicep curls with light resistance   Person(s) Educated Patient   Methods Explanation;Demonstration;Verbal cues   Comprehension Verbalized understanding;Returned demonstration;Need further instruction          OT Short Term Goals - 12/23/15 2127    OT SHORT TERM GOAL #1   Title Patient will complete HEP with min cueing   Status On-going   OT SHORT TERM GOAL #2   Title Patient will bathe lower egs and feet with min cueing following set up   Status On-going   OT [place] #3   Title Patient will demonstrate sufficient strength to get onto and off of floor with mod assist as needed for tub transfer as desired   Status On-going   OT  SHORT TERM GOAL #4   Title Patient will demonstrate sufficient shoulder and elbow range of motion and be able to sustain overhead functional position long enough to brush and style hair with no more than 3 rest breaks.   Status On-going           OT Long Term Goals - 12/23/15 2127    OT LONG TERM GOAL #1   Title Patient will complete a HEP / Home activity program independently    Status On-going   OT LONG TERM GOAL #2   Title Patient will wash lower legs and feet with modified independence   Status On-going   OT LONG TERM GOAL #3   Title Patient will apply lotion to lower legs and feet with modified independence   Status On-going   OT LONG TERM GOAL #4   Title Patient will demonstrate adequate strength and range of motion in right and left shoulder and elbow to effectively brush and style hair into ponytail without rest breaks, and without physical assist.   Status On-going               Plan - 12/23/15 2121    Clinical Impression Statement Patient is eager to improve her functional with basic self care tasks.  Patient may benefit from adaptive equipment to aide in care for lower legs and feet as she has significantly limited hip motion.     Pt will benefit from skilled therapeutic intervention in order to improve on the following deficits (Retired) Decreased activity tolerance;Decreased balance;Decreased cognition;Decreased mobility;Decreased knowledge of use of DME;Decreased knowledge of precautions;Decreased endurance;Decreased coordination;Decreased range of motion;Decreased safety awareness;Decreased strength;Impaired perceived functional ability;Increased edema;Difficulty walking;Impaired flexibility;Impaired tone;Impaired UE functional use;Pain   Rehab Potential Fair   OT Frequency 2x / week   OT Duration 8 weeks   OT Treatment/Interventions Self-care/ADL training;Moist Heat;Fluidtherapy;Ultrasound;Therapeutic exercise;Neuromuscular education;DME and/or AE  instruction;Manual Therapy;Functional Mobility Training;Splinting;Therapeutic exercises;Therapeutic activities;Cognitive remediation/compensation;Patient/family education;Balance training   Plan Review HEP, increase tolerance to stand and reach, work overhead for increased time   [place] Initiated today - will need to reinforce.  Handout not yet provided to patient.   Consulted and Agree with Plan of Care Patient        Problem List Patient Active Problem List   Diagnosis Date Noted  . Spastic hemiplegia affecting nondominant side ([place]) 11/29/2015  . 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 Acute CVA (cerebrovascular accident) ([place]) 09/14/2015  . CVA (cerebral infarction) 09/13/2015  . Hyperlipidemia 05/31/2014  . Pedal edema 05/11/2014  . Paranoia ([place]) 05/11/2014  . Breast cancer  of upper-inner quadrant of right female breast ([place]) 04/15/2014  . History of pituitary tumor 01/28/2014  . Avascular necrosis of bones of both hips ([place]) 01/28/2014  . Genital herpes 01/28/2014  . Ovarian cyst, bilateral 01/26/2014  . Essential hypertension, benign 01/26/2014  . Unspecified vitamin D deficiency 01/26/2014  . DJD (degenerative joint disease) 01/26/2014  . HIV disease ([place]) 01/07/2014    Mariah Milling, OTR/L 12/23/2015, 9:30 PM  [facility] [address] [place], [place], [address] Phone: [phone]   Fax:  [phone]  Name: Kelly Chavez MRN: [id number] Date of Birth: 03/08/1946

## 2015-12-23 NOTE — Patient Instructions (Addendum)
SHOULDER: Flexion Unilateral    Raise one arm overhead. Keep thumb pointed up. 10 reps per set, 3 sets per day, 6-7(Clinic) Extension: Short Head Triceps Press    Elevation: Military Press - Incline (Dumbbell)    Lean back, neck in line with spine, weights held near shoulders, palms forward. Press straight up, touching weights above head, palms in. Repeat 10 times per set. Do 3 sets per session. Do 6-7 sessions per week. Use 1 lb weights.  Copyright  VHI. All rights reserved.

## 2015-12-23 NOTE — Therapy (Signed)
[facility] [address] [place], [place], [address] Phone: [phone]   Fax:  [phone]  Physical Therapy Treatment  Patient Details  Name: Kelly Chavez MRN: [id number] Date of Birth: [birth year] Referring Provider: Garvin Fila  Encounter Date: 12/23/2015      PT End of Session - 12/23/15 1456    Visit Number 2   Number of Visits 8   Date for PT Re-Evaluation 01/12/16   PT Start Time [id number]  session started late as pt needed to use restroom   PT Stop Time 1445   PT Time Calculation (min) 30 min   Equipment Utilized During Treatment Gait belt   Activity Tolerance Patient tolerated treatment well   Behavior During Therapy [facility] for tasks assessed/performed      Past Medical History  Diagnosis Date  . 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 Cancer of sigmoid colon [facility]) 2011    sigmoid colectomy   . Status post chemotherapy 2012  . Brain tumor (benign) ([place])   . Avascular necrosis of bones of both hips ([place])   . 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 HIV (human immunodeficiency virus infection) ([place])   . Hypertension   . Anxiety   . Complication of anesthesia     senitive to all  . Stroke [facility])     Past Surgical History  Procedure Laterality Date  . Tonsillectomy    . Total abdominal hysterectomy w/ bilateral salpingoophorectomy  2012  . Colon surgery      partial colectomy  . Mastectomy modified radical Right 06/15/2014    Procedure: RIGHT MODIFIED RADICAL MASTECTOMY;  Surgeon: Shann Medal, MD;  Location: [place];  Service: General;  Laterality: Right;  . Portacath placement N/A 06/15/2014    Procedure: INSERTION PORT-A-CATH LEFT SUBCLAVIAN;  Surgeon: Shann Medal, MD;  Location: [place];  Service: General;  Laterality: N/A;  . Abdominal hysterectomy      There were no vitals filed for this visit.  Visit Diagnosis:  Dominant hemiplegia complicating stroke  (HCC)  Generalized weakness  Unsteadiness on feet  Abnormality of gait  Decreased ROM of lower extremity  Cognitive and neurobehavioral dysfunction      Subjective Assessment - 12/23/15 1455    Subjective doing well today; "my imaginary friends will know how much magnetic force to use." (during balance assessment)   Patient Stated Goals "the walking and feel better in my back"   Currently in Pain? No/denies            [facility] PT Assessment - 12/23/15 1424    Standardized Balance Assessment   Standardized Balance Assessment Berg Balance Test   Berg Balance Test   Sit to Stand Able to stand  independently using hands   Standing Unsupported Able to stand safely 2 minutes   Sitting with Back Unsupported but Feet Supported on Floor or Stool Able to sit safely and securely 2 minutes   Stand to Sit Controls descent by using hands   Transfers Needs one person to assist   Standing Unsupported with Eyes Closed Able to stand 10 seconds with supervision   Standing Ubsupported with Feet Together Needs help to attain position and unable to hold for 15 seconds   From Standing, Reach Forward with Outstretched Arm Reaches forward but needs supervision   From Standing Position, Pick up Object from Floor Unable to try/needs assist to keep balance   From Standing Position, Turn to Look Behind  Over each Shoulder Turn sideways only but maintains balance   Turn 360 Degrees Needs assistance while turning  unable   Standing Unsupported, Alternately Place Feet on Step/Stool Needs assistance to keep from falling or unable to try   Standing Unsupported, One Foot in Front Needs help to step but can hold 15 seconds   Standing on One Leg Unable to try or needs assist to prevent fall   Total Score 22                     [facility] Adult PT Treatment/Exercise - 12/23/15 1426    Exercises   Exercises Knee/Hip   Knee/Hip Exercises: Standing   Heel Raises Both;20 reps   Heel Raises Limitations  toe raises x 20   Knee Flexion Both;10 reps   Hip Abduction Both;10 reps;Knee straight   Abduction Limitations limited ROM due to AVN   Other Standing Knee Exercises sidestepping along counter x 2 trips                PT Education - 12/23/15 1456    Education provided Yes   Education Details HEP   Person(s) Educated Patient   Methods Explanation;Demonstration;Handout   Comprehension Verbalized understanding;Returned demonstration;Need further instruction             PT Long Term Goals - 12/23/15 1457    PT LONG TERM GOAL #1   Title independent with HEP (01/12/16)   Status On-going   PT LONG TERM GOAL #2   Title improve timed up and go to < 50 sec for improved mobility (01/12/16)   Status On-going   PT LONG TERM GOAL #3   Title improve gait velocity to > 1.3 ft/sec for improved mobility and community access (01/12/16)   Status On-going   PT LONG TERM GOAL #4   Title perform BERG with goal to be written  >/= 27/56 (01/12/16)   Status Revised               Plan - 12/23/15 1456    Clinical Impression Statement Pt scored 22/56 on BERG balance assessment indicating high fall risk.  Feel pt could realistically improve 5-8 points but additional progess limited due to ROM limitations of hips.   PT Next Visit Plan review HEP; balance and mobility   Consulted and Agree with Plan of Care Patient        Problem List Patient Active Problem List   Diagnosis Date Noted  . Spastic hemiplegia affecting nondominant side ([place]) 11/29/2015  . 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 Acute CVA (cerebrovascular accident) ([place]) 09/14/2015  . CVA (cerebral infarction) 09/13/2015  . Hyperlipidemia 05/31/2014  . Pedal edema 05/11/2014  . Paranoia ([place]) 05/11/2014  . Breast cancer of upper-inner quadrant of right female breast ([place]) 04/15/2014  . History of pituitary tumor 01/28/2014  . Avascular necrosis of bones of both hips ([place]) 01/28/2014  .  Genital herpes 01/28/2014  . Ovarian cyst, bilateral 01/26/2014  . Essential hypertension, benign 01/26/2014  . Unspecified vitamin D deficiency 01/26/2014  . DJD (degenerative joint disease) 01/26/2014  . HIV disease ([place]) 01/07/2014   Laureen Abrahams, PT, DPT 12/23/2015 2:59 PM  [facility] [address] [place], [place], [address] Phone: [phone]   Fax:  [phone]  Name: Kelly Chavez MRN: [id number] Date of Birth: Sep 15, 1946

## 2015-12-27 ENCOUNTER — Ambulatory Visit: Payer: Medicare Other | Admitting: Occupational Therapy

## 2015-12-27 ENCOUNTER — Ambulatory Visit: Payer: Medicare Other | Admitting: Physical Therapy

## 2015-12-31 ENCOUNTER — Encounter: Payer: Self-pay | Admitting: Occupational Therapy

## 2015-12-31 ENCOUNTER — Ambulatory Visit: Payer: Medicare Other | Admitting: Physical Therapy

## 2015-12-31 ENCOUNTER — Ambulatory Visit: Payer: Medicare Other | Admitting: Occupational Therapy

## 2015-12-31 DIAGNOSIS — R269 Unspecified abnormalities of gait and mobility: Secondary | ICD-10-CM

## 2015-12-31 DIAGNOSIS — I69359 Hemiplegia and hemiparesis following cerebral infarction affecting unspecified side: Secondary | ICD-10-CM

## 2015-12-31 DIAGNOSIS — R531 Weakness: Secondary | ICD-10-CM

## 2015-12-31 DIAGNOSIS — R2681 Unsteadiness on feet: Secondary | ICD-10-CM

## 2015-12-31 DIAGNOSIS — M25669 Stiffness of unspecified knee, not elsewhere classified: Secondary | ICD-10-CM

## 2015-12-31 NOTE — Therapy (Signed)
[facility] [address] [place], [place], [address] Phone: [phone]   Fax:  [phone]  Physical Therapy Treatment  Patient Details  Name: Kelly Chavez MRN: [id number] Date of Birth: 24-May-1946 Referring Provider: Garvin Fila  Encounter Date: 12/31/2015      PT End of Session - 12/31/15 1157    Visit Number 3   Number of Visits 8   Date for PT Re-Evaluation 01/12/16   PT Start Time 1107  pt arrived late   PT Stop Time 1145   PT Time Calculation (min) 38 min   Equipment Utilized During Treatment Gait belt   Activity Tolerance Patient tolerated treatment well   Behavior During Therapy [facility] for tasks assessed/performed      Past Medical History  Diagnosis Date  . 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 Cancer of sigmoid colon [facility]) 2011    sigmoid colectomy   . Status post chemotherapy 2012  . Brain tumor (benign) ([place])   . Avascular necrosis of bones of both hips ([place])   . 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 HIV (human immunodeficiency virus infection) ([place])   . Hypertension   . Anxiety   . Complication of anesthesia     senitive to all  . Stroke [facility])     Past Surgical History  Procedure Laterality Date  . Tonsillectomy    . Total abdominal hysterectomy w/ bilateral salpingoophorectomy  2012  . Colon surgery      partial colectomy  . Mastectomy modified radical Right 06/15/2014    Procedure: RIGHT MODIFIED RADICAL MASTECTOMY;  Surgeon: Shann Medal, MD;  Location: [place];  Service: General;  Laterality: Right;  . Portacath placement N/A 06/15/2014    Procedure: INSERTION PORT-A-CATH LEFT SUBCLAVIAN;  Surgeon: Shann Medal, MD;  Location: [place];  Service: General;  Laterality: N/A;  . Abdominal hysterectomy      There were no vitals filed for this visit.  Visit Diagnosis:  Dominant hemiplegia complicating stroke ([place])  Generalized weakness  Unsteadiness  on feet  Abnormality of gait  Decreased ROM of lower extremity      Subjective Assessment - 12/31/15 1110    Subjective forgot about appt on monday; thought it was tuesday.   Currently in Pain? --  "I always have pain."   Pain Score 4    Pain Location Hip   Pain Orientation Left;Right   Pain Descriptors / Indicators Aching   Pain Type Chronic pain   Pain Onset More than a month ago   Pain Frequency Constant   Aggravating Factors  sitting   Pain Relieving Factors movement                         [facility] Adult PT Treatment/Exercise - 12/31/15 1115    Knee/Hip Exercises: Standing   Heel Raises Both;20 reps   Heel Raises Limitations toe raises x 20   Knee Flexion Both;10 reps   Hip Abduction Both;10 reps;Knee straight   Abduction Limitations limited ROM due to AVN   Other Standing Knee Exercises sidestepping along counter x 2 trips   Other Standing Knee Exercises backwards walking in parallel bars x 4      Neuro: On level ground: feet together with EC 3x10 sec; feet apart with EC horizontal/vertical head turns On compliant surface: feet apart with EC 3x10 sec; feet apart with trunk rotation/reaching laterally and vertically x 10 bil  Increased fatigue with balance exercises needing standing rest breaks and max encouragement to participate.               PT Long Term Goals - 12/23/15 1457    PT LONG TERM GOAL #1   Title independent with HEP (01/12/16)   Status On-going   PT LONG TERM GOAL #2   Title improve timed up and go to < 50 sec for improved mobility (01/12/16)   Status On-going   PT LONG TERM GOAL #3   Title improve gait velocity to > 1.3 ft/sec for improved mobility and community access (01/12/16)   Status On-going   PT LONG TERM GOAL #4   Title perform BERG with goal to be written  >/= 27/56 (01/12/16)   Status Revised               Plan - 12/31/15 1158    Clinical Impression Statement Pt tolerated standing balance activities  well, but reports increased fatigue with exercises.  C/O "dizziness" with balance exercises, and upon further questioning reports she feels off balance rather than dizziness.  Balance exercises challenged balance but pt able to correct without LOB.   PT Next Visit Plan continue balance and mobility   Consulted and Agree with Plan of Care Patient        Problem List Patient Active Problem List   Diagnosis Date Noted  . Spastic hemiplegia affecting nondominant side ([place]) 11/29/2015  . Cerebral infarction due to thrombosis of basilar artery ([place])   . Acute CVA (cerebrovascular accident) ([place]) 09/14/2015  . CVA (cerebral infarction) 09/13/2015  . Hyperlipidemia 05/31/2014  . Pedal edema 05/11/2014  . Paranoia ([place]) 05/11/2014  . Breast cancer of upper-inner quadrant of right female breast ([place]) 04/15/2014  . History of pituitary tumor 01/28/2014  . Avascular necrosis of bones of both hips ([place]) 01/28/2014  . Genital herpes 01/28/2014  . Ovarian cyst, bilateral 01/26/2014  . Essential hypertension, benign 01/26/2014  . Unspecified vitamin D deficiency 01/26/2014  . DJD (degenerative joint disease) 01/26/2014  . HIV disease ([place]) 01/07/2014   Laureen Abrahams, PT, DPT 12/31/2015 12:02 PM  [facility] [address] [place], [place], [address] Phone: [phone]   Fax:  [phone]  Name: Kelly Chavez MRN: [id number] Date of Birth: 1946-08-10

## 2015-12-31 NOTE — Therapy (Signed)
[facility] [address] [place], [place], [address] Phone: [phone]   Fax:  [phone]  Occupational Therapy Treatment  Patient Details  Name: Kelly Chavez MRN: [id number] Date of Birth: 23-Apr-1946 Referring Provider: Dr Mamie Nick. Sethi  Encounter Date: 12/31/2015      OT End of Session - 12/31/15 1408    Visit Number 3   Number of Visits 17   Date for OT Re-Evaluation 02/16/16   Authorization Type Medicare - G code and progress note on 10th visit   Authorization - Visit Number 3   Authorization - Number of Visits 10   OT Start Time [id number]   OT Stop Time 1231   OT Time Calculation (min) 44 min   Activity Tolerance Patient tolerated treatment well   Behavior During Therapy [facility] for tasks assessed/performed      Past Medical History  Diagnosis Date  . 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 Cancer of sigmoid colon [facility]) 2011    sigmoid colectomy   . Status post chemotherapy 2012  . Brain tumor (benign) ([place])   . Avascular necrosis of bones of both hips ([place])   . 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 HIV (human immunodeficiency virus infection) ([place])   . Hypertension   . Anxiety   . Complication of anesthesia     senitive to all  . Stroke [facility])     Past Surgical History  Procedure Laterality Date  . Tonsillectomy    . Total abdominal hysterectomy w/ bilateral salpingoophorectomy  2012  . Colon surgery      partial colectomy  . Mastectomy modified radical Right 06/15/2014    Procedure: RIGHT MODIFIED RADICAL MASTECTOMY;  Surgeon: Shann Medal, MD;  Location: [place];  Service: General;  Laterality: Right;  . Portacath placement N/A 06/15/2014    Procedure: INSERTION PORT-A-CATH LEFT SUBCLAVIAN;  Surgeon: Shann Medal, MD;  Location: [place];  Service: General;  Laterality: N/A;  . Abdominal hysterectomy      There were no vitals filed for this visit.  Visit Diagnosis:   Dominant hemiplegia complicating stroke (HCC)  Generalized weakness  Unsteadiness on feet      Subjective Assessment - 12/31/15 1148    Subjective  I didn't feel like doing nothing this week - I was so lazy!   Patient Stated Goals I want to feel good about myself, I want to wash my feet.     Currently in Pain? Yes   Pain Score 4    Pain Location Knee                      OT Treatments/Exercises (OP) - 12/31/15 0001    ADLs   LB Dressing Patient expressing difficulty with LB dressing, and has been using her broad based can to pull up her pants while standing with the ironingboard for support.  Discussed other options for patient that may be safer and help conserve energy.  Practiced donning pants while seated, using reacher.     Functional Mobility Patient very reliant on UE's for balance in standing.  Practiced standing balance with decrease UE support.  Standing and stepping - patient very fearful of momentary single limb stance without UE support, but with Mod faciltation, able to complete.     Shoulder Exercises: Seated   Flexion AROM;10 reps   Flexion Weight (lbs) 3  OT Education - 12/31/15 1408    Education provided Yes   Education Details Adaptive equipment to aide with dressing and bathing   Person(s) Educated Patient   Methods Explanation;Demonstration   Comprehension Verbalized understanding;Returned demonstration          OT Short Term Goals - 12/23/15 2127    OT SHORT TERM GOAL #1   Title Patient will complete HEP with min cueing   Status On-going   OT SHORT TERM GOAL #2   Title Patient will bathe lower egs and feet with min cueing following set up   Status On-going   OT [place] #3   Title Patient will demonstrate sufficient strength to get onto and off of floor with mod assist as needed for tub transfer as desired   Status On-going   OT SHORT TERM GOAL #4   Title Patient will demonstrate sufficient shoulder and  elbow range of motion and be able to sustain overhead functional position long enough to brush and style hair with no more than 3 rest breaks.   Status On-going           OT Long Term Goals - 12/23/15 2127    OT LONG TERM GOAL #1   Title Patient will complete a HEP / Home activity program independently    Status On-going   OT LONG TERM GOAL #2   Title Patient will wash lower legs and feet with modified independence   Status On-going   OT LONG TERM GOAL #3   Title Patient will apply lotion to lower legs and feet with modified independence   Status On-going   OT LONG TERM GOAL #4   Title Patient will demonstrate adequate strength and range of motion in right and left shoulder and elbow to effectively brush and style hair into ponytail without rest breaks, and without physical assist.   Status On-going               Plan - 12/31/15 1409    Clinical Impression Statement Patient was only seen 1x this week, as she forgot her earlier appointment.  Patient with general report of fatigue this session, although eager for tips to increase independence and safety with ADL.   Pt will benefit from skilled therapeutic intervention in order to improve on the following deficits (Retired) Decreased activity tolerance;Decreased balance;Decreased cognition;Decreased mobility;Decreased knowledge of use of DME;Decreased knowledge of precautions;Decreased endurance;Decreased coordination;Decreased range of motion;Decreased safety awareness;Decreased strength;Impaired perceived functional ability;Increased edema;Difficulty walking;Impaired flexibility;Impaired tone;Impaired UE functional use;Pain   Rehab Potential Fair   OT Frequency 2x / week   OT Duration 8 weeks   OT Treatment/Interventions Self-care/ADL training;Moist Heat;Fluidtherapy;Ultrasound;Therapeutic exercise;Neuromuscular education;DME and/or AE instruction;Manual Therapy;Functional Mobility Training;Splinting;Therapeutic  exercises;Therapeutic activities;Cognitive remediation/compensation;Patient/family education;Balance training   Plan Increase stand tolerance, stand and reach, work overhead for increased time, light weight training   OT Home Exercise Plan Reviewed HEP- Shoulder   Consulted and Agree with Plan of Care Patient        Problem List Patient Active Problem List   Diagnosis Date Noted  . Spastic hemiplegia affecting nondominant side ([place]) 11/29/2015  . 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 Acute CVA (cerebrovascular accident) ([place]) 09/14/2015  . CVA (cerebral infarction) 09/13/2015  . Hyperlipidemia 05/31/2014  . Pedal edema 05/11/2014  . Paranoia ([place]) 05/11/2014  . Breast cancer of upper-inner quadrant of right female breast ([place]) 04/15/2014  . History of pituitary tumor 01/28/2014  . Avascular necrosis of bones of  both hips ([place]) 01/28/2014  . Genital herpes 01/28/2014  . Ovarian cyst, bilateral 01/26/2014  . Essential hypertension, benign 01/26/2014  . Unspecified vitamin D deficiency 01/26/2014  . DJD (degenerative joint disease) 01/26/2014  . HIV disease ([employer]) 01/07/2014    Mariah Milling, OTR/L 12/31/2015, 2:13 PM  [facility] [address] [place], [place], [address] Phone: [phone]   Fax:  [phone]  Name: Kelly Chavez MRN: [id number] Date of Birth: 08-30-1946

## 2016-01-03 ENCOUNTER — Ambulatory Visit: Payer: Medicare Other | Admitting: Physical Therapy

## 2016-01-03 ENCOUNTER — Ambulatory Visit: Payer: Medicare Other | Admitting: Occupational Therapy

## 2016-01-06 ENCOUNTER — Ambulatory Visit: Payer: Medicare Other | Admitting: Occupational Therapy

## 2016-01-06 ENCOUNTER — Ambulatory Visit: Payer: Medicare Other | Admitting: Physical Therapy

## 2016-01-06 ENCOUNTER — Encounter: Payer: Medicare Other | Admitting: Occupational Therapy

## 2016-01-06 ENCOUNTER — Encounter: Payer: Self-pay | Admitting: Occupational Therapy

## 2016-01-06 DIAGNOSIS — F0789 Other personality and behavioral disorders due to known physiological condition: Secondary | ICD-10-CM

## 2016-01-06 DIAGNOSIS — R269 Unspecified abnormalities of gait and mobility: Secondary | ICD-10-CM

## 2016-01-06 DIAGNOSIS — I69359 Hemiplegia and hemiparesis following cerebral infarction affecting unspecified side: Secondary | ICD-10-CM

## 2016-01-06 DIAGNOSIS — R2681 Unsteadiness on feet: Secondary | ICD-10-CM

## 2016-01-06 DIAGNOSIS — M6281 Muscle weakness (generalized): Secondary | ICD-10-CM

## 2016-01-06 DIAGNOSIS — M25669 Stiffness of unspecified knee, not elsewhere classified: Secondary | ICD-10-CM

## 2016-01-06 DIAGNOSIS — F09 Unspecified mental disorder due to known physiological condition: Secondary | ICD-10-CM

## 2016-01-06 DIAGNOSIS — R531 Weakness: Secondary | ICD-10-CM

## 2016-01-06 NOTE — Therapy (Signed)
[facility] [address] [place], [place], [address] Phone: [phone]   Fax:  [phone]  Physical Therapy Treatment  Patient Details  Name: Kelly Chavez MRN: [id number] Date of Birth: Jun 12, 1946 Referring Provider: Garvin Fila  Encounter Date: 01/06/2016      PT End of Session - 01/06/16 1457    Visit Number 4   Number of Visits 8   Date for PT Re-Evaluation 01/12/16   PT Start Time [id number]  pt arrived late   PT Stop Time 1445   PT Time Calculation (min) 35 min   Equipment Utilized During Treatment Gait belt   Activity Tolerance Patient tolerated treatment well   Behavior During Therapy [facility] for tasks assessed/performed      Past Medical History  Diagnosis Date  . 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 Cancer of sigmoid colon [facility]) 2011    sigmoid colectomy   . Status post chemotherapy 2012  . Brain tumor (benign) ([place])   . Avascular necrosis of bones of both hips ([place])   . 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 HIV (human immunodeficiency virus infection) ([place])   . Hypertension   . Anxiety   . Complication of anesthesia     senitive to all  . Stroke [facility])     Past Surgical History  Procedure Laterality Date  . Tonsillectomy    . Total abdominal hysterectomy w/ bilateral salpingoophorectomy  2012  . Colon surgery      partial colectomy  . Mastectomy modified radical Right 06/15/2014    Procedure: RIGHT MODIFIED RADICAL MASTECTOMY;  Surgeon: Shann Medal, MD;  Location: [facility];  Service: General;  Laterality: Right;  . Portacath placement N/A 06/15/2014    Procedure: INSERTION PORT-A-CATH LEFT SUBCLAVIAN;  Surgeon: Shann Medal, MD;  Location: [facility];  Service: General;  Laterality: N/A;  . Abdominal hysterectomy      There were no vitals filed for this visit.  Visit Diagnosis:  Dominant hemiplegia complicating stroke (HCC)  Unsteadiness on feet  Generalized  weakness  Abnormality of gait  Decreased ROM of lower extremity  Cognitive and neurobehavioral dysfunction      Subjective Assessment - 01/06/16 1415    Subjective had a pretty good weekend; no falls   Patient Stated Goals "the walking and feel better in my back"   Currently in Pain? Yes   Pain Location Hip   Pain Orientation Right;Left   Pain Descriptors / Indicators Aching   Pain Type Chronic pain                         [facility] Adult PT Treatment/Exercise - 01/06/16 1416    Knee/Hip Exercises: Standing   Heel Raises Both;20 reps   Heel Raises Limitations toe raises x 20   Other Standing Knee Exercises sidestepping in parallel bars x 2 trips without UE support   Other Standing Knee Exercises backwards walking in parallel bars x 2 without UE support             Balance Exercises - 01/06/16 1456    Balance Exercises: Standing   Other Standing Exercises on red balance beam with 1 UE support: horizontal/vertical head turns x 10; alt taps forward x 10; hip flexion with knee extended x 10 bil; hip abdct x 10 bil                PT Long Term Goals -  12/23/15 1457    PT LONG TERM GOAL #1   Title independent with HEP (01/12/16)   Status On-going   PT LONG TERM GOAL #2   Title improve timed up and go to < 50 sec for improved mobility (01/12/16)   Status On-going   PT LONG TERM GOAL #3   Title improve gait velocity to > 1.3 ft/sec for improved mobility and community access (01/12/16)   Status On-going   PT LONG TERM GOAL #4   Title perform BERG with goal to be written  >/= 27/56 (01/12/16)   Status Revised               Plan - 01/06/16 1458    Clinical Impression Statement Pt needed 1 seated rest break due to fatigue and difficulty with compliant surface activities.  Pt able to perform sidestepping and backwards walking without UE support today.   PT Next Visit Plan continue balance and mobility; begin checking goals; anticipate d/c next week    Consulted and Agree with Plan of Care Patient        Problem List Patient Active Problem List   Diagnosis Date Noted  . Spastic hemiplegia affecting nondominant side ([place]) 11/29/2015  . Cerebral infarction due to thrombosis of basilar artery ([place])   . Acute CVA (cerebrovascular accident) ([place]) 09/14/2015  . CVA (cerebral infarction) 09/13/2015  . Hyperlipidemia 05/31/2014  . Pedal edema 05/11/2014  . Paranoia ([place]) 05/11/2014  . Breast cancer of upper-inner quadrant of right female breast ([place]) 04/15/2014  . History of pituitary tumor 01/28/2014  . Avascular necrosis of bones of both hips ([place]) 01/28/2014  . Genital herpes 01/28/2014  . Ovarian cyst, bilateral 01/26/2014  . Essential hypertension, benign 01/26/2014  . Unspecified vitamin D deficiency 01/26/2014  . DJD (degenerative joint disease) 01/26/2014  . HIV disease ([place]) 01/07/2014   Laureen Abrahams, PT, DPT 01/06/2016 3:01 PM  [facility] [address] [place], [place], [address] Phone: [phone]   Fax:  [phone]  Name: Kelly Chavez MRN: [id number] Date of Birth: 1946/03/28

## 2016-01-07 NOTE — Therapy (Signed)
[facility] [address] [place], [place], [address] Phone: [phone]   Fax:  [phone]  Occupational Therapy Treatment  Patient Details  Name: Kelly Chavez MRN: [id number] Date of Birth: March 23, 1946 Referring Provider: Dr Mamie Nick. Sethi  Encounter Date: 01/06/2016      OT End of Session - 01/07/16 1614    Visit Number 4   Number of Visits 17   Date for OT Re-Evaluation 02/16/16   Authorization Type Medicare - G code and progress note on 10th visit   Authorization - Visit Number 4   Authorization - Number of Visits 10   OT Start Time [id number]   OT Stop Time 1530   OT Time Calculation (min) 43 min   Activity Tolerance Patient tolerated treatment well   Behavior During Therapy WFL for tasks assessed/performed      Past Medical History  Diagnosis Date  . 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 Cancer of sigmoid colon [facility]) 2011    sigmoid colectomy   . Status post chemotherapy 2012  . Brain tumor (benign) ([place])   . Avascular necrosis of bones of both hips ([place])   . 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 HIV (human immunodeficiency virus infection) ([place])   . Hypertension   . Anxiety   . Complication of anesthesia     senitive to all  . Stroke [facility])     Past Surgical History  Procedure Laterality Date  . Tonsillectomy    . Total abdominal hysterectomy w/ bilateral salpingoophorectomy  2012  . Colon surgery      partial colectomy  . Mastectomy modified radical Right 06/15/2014    Procedure: RIGHT MODIFIED RADICAL MASTECTOMY;  Surgeon: Shann Medal, MD;  Location: [facility];  Service: General;  Laterality: Right;  . Portacath placement N/A 06/15/2014    Procedure: INSERTION PORT-A-CATH LEFT SUBCLAVIAN;  Surgeon: Shann Medal, MD;  Location: [facility];  Service: General;  Laterality: N/A;  . Abdominal hysterectomy      There were no vitals filed for this visit.  Visit Diagnosis:   Muscle weakness (generalized)  Unsteadiness on feet  Dominant hemiplegia complicating stroke [facility])      Subjective Assessment - 01/06/16 1451    Subjective  I didn't get everything done that I hoped to do.  I am tired.   Patient Stated Goals I want to feel good about myself, I want to wash my feet.     Currently in Pain? Yes   Pain Score 4    Pain Location Hip   Pain Orientation Right;Left   Pain Descriptors / Indicators Aching   Pain Type Chronic pain   Pain Onset More than a month ago   Pain Frequency Constant   Aggravating Factors  changing positions   Pain Relieving Factors rest                           Balance Exercises - 01/06/16 1456    Balance Exercises: Standing   Other Standing Exercises on red balance beam with 1 UE support: horizontal/vertical head turns x 10; alt taps forward x 10; hip flexion with knee extended x 10 bil; hip abdct x 10 bil           OT Education - 01/07/16 1614    Education provided Yes   Education Details Overhead reaching activity in [employer]) Educated Patient   Methods Explanation;Demonstration  Comprehension Verbalized understanding;Returned demonstration          OT Short Term Goals - 01/07/16 1616    OT SHORT TERM GOAL #1   Title Patient will complete HEP with min cueing   Status On-going   OT SHORT TERM GOAL #2   Title Patient will bathe lower egs and feet with min cueing following set up   Status On-going   OT [place] #3   Title Patient will demonstrate sufficient strength to get onto and off of floor with mod assist as needed for tub transfer as desired   Status Achieved   OT SHORT TERM GOAL #4   Title Patient will demonstrate sufficient shoulder and elbow range of motion and be able to sustain overhead functional position long enough to brush and style hair with no more than 3 rest breaks.   Status Achieved           OT Long Term Goals - 01/07/16 1617    OT LONG TERM GOAL #1    Title Patient will complete a HEP / Home activity program independently    Status On-going   OT LONG TERM GOAL #2   Title Patient will wash lower legs and feet with modified independence   Status On-going   OT LONG TERM GOAL #3   Title Patient will apply lotion to lower legs and feet with modified independence   Status On-going   OT LONG TERM GOAL #4   Title Patient will demonstrate adequate strength and range of motion in right and left shoulder and elbow to effectively brush and style hair into ponytail without rest breaks, and without physical assist.   Status Achieved               Plan - 01/07/16 1615    Clinical Impression Statement Patient making steady improvement in functional reach and strength in right arm.  Patient with general report of fatigue each session.     Pt will benefit from skilled therapeutic intervention in order to improve on the following deficits (Retired) Decreased activity tolerance;Decreased balance;Decreased cognition;Decreased mobility;Decreased knowledge of use of DME;Decreased knowledge of precautions;Decreased endurance;Decreased coordination;Decreased range of motion;Decreased safety awareness;Decreased strength;Impaired perceived functional ability;Increased edema;Difficulty walking;Impaired flexibility;Impaired tone;Impaired UE functional use;Pain   Rehab Potential Fair   OT Frequency 2x / week   OT Duration 8 weeks   OT Treatment/Interventions Self-care/ADL training;Moist Heat;Fluidtherapy;Ultrasound;Therapeutic exercise;Neuromuscular education;DME and/or AE instruction;Manual Therapy;Functional Mobility Training;Splinting;Therapeutic exercises;Therapeutic activities;Cognitive remediation/compensation;Patient/family education;Balance training   Plan light weight training, UBE, stand tolerance   Consulted and Agree with Plan of Care Patient        Problem List Patient Active Problem List   Diagnosis Date Noted  . Spastic hemiplegia  affecting nondominant side ([place]) 11/29/2015  . 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 Acute CVA (cerebrovascular accident) ([place]) 09/14/2015  . CVA (cerebral infarction) 09/13/2015  . Hyperlipidemia 05/31/2014  . Pedal edema 05/11/2014  . Paranoia ([place]) 05/11/2014  . Breast cancer of upper-inner quadrant of right female breast ([place]) 04/15/2014  . History of pituitary tumor 01/28/2014  . Avascular necrosis of bones of both hips ([place]) 01/28/2014  . Genital herpes 01/28/2014  . Ovarian cyst, bilateral 01/26/2014  . Essential hypertension, benign 01/26/2014  . Unspecified vitamin D deficiency 01/26/2014  . DJD (degenerative joint disease) 01/26/2014  . HIV disease ([place]) 01/07/2014    Mariah Milling, OTR/L 01/07/2016, 4:22 PM  [facility] [address]  [place], [place], [address] Phone: [phone]   Fax:  [phone]  Name: Kelly Chavez MRN: [id number] Date of Birth: [birth year]

## 2016-01-11 ENCOUNTER — Encounter: Payer: Self-pay | Admitting: Physical Therapy

## 2016-01-11 ENCOUNTER — Ambulatory Visit: Payer: Medicare Other | Admitting: Physical Therapy

## 2016-01-11 ENCOUNTER — Ambulatory Visit: Payer: Medicare Other | Admitting: Occupational Therapy

## 2016-01-11 ENCOUNTER — Encounter: Payer: Medicare Other | Admitting: Occupational Therapy

## 2016-01-11 ENCOUNTER — Encounter: Payer: Self-pay | Admitting: Occupational Therapy

## 2016-01-11 DIAGNOSIS — R531 Weakness: Secondary | ICD-10-CM

## 2016-01-11 DIAGNOSIS — R269 Unspecified abnormalities of gait and mobility: Secondary | ICD-10-CM

## 2016-01-11 DIAGNOSIS — R2681 Unsteadiness on feet: Secondary | ICD-10-CM

## 2016-01-11 DIAGNOSIS — M6281 Muscle weakness (generalized): Secondary | ICD-10-CM

## 2016-01-11 DIAGNOSIS — I69359 Hemiplegia and hemiparesis following cerebral infarction affecting unspecified side: Secondary | ICD-10-CM

## 2016-01-11 NOTE — Therapy (Signed)
[facility] [address] [place], [place], [address] Phone: [phone]   Fax:  [phone]  Physical Therapy Treatment  Patient Details  Name: Kelly Chavez MRN: [id number] Date of Birth: 04/11/1946 Referring Provider: Garvin Fila  Encounter Date: 01/11/2016      PT End of Session - 01/11/16 1631    Visit Number 5   Number of Visits 8   Date for PT Re-Evaluation 01/12/16   PT Start Time [id number]   PT Stop Time 1529   PT Time Calculation (min) 43 min   Equipment Utilized During Treatment Gait belt   Activity Tolerance Patient tolerated treatment well;Patient limited by fatigue   Behavior During Therapy [facility] for tasks assessed/performed      Past Medical History  Diagnosis Date  . 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 Cancer of sigmoid colon [facility]) 2011    sigmoid colectomy   . Status post chemotherapy 2012  . Brain tumor (benign) ([place])   . Avascular necrosis of bones of both hips ([place])   . Invasive ductal carcinoma of right breast ([place]) 04/09/14  . HIV (human immunodeficiency virus infection) ([place])   . Hypertension   . Anxiety   . Complication of anesthesia     senitive to all  . Stroke [facility])     Past Surgical History  Procedure Laterality Date  . Tonsillectomy    . Total abdominal hysterectomy w/ bilateral salpingoophorectomy  2012  . Colon surgery      partial colectomy  . Mastectomy modified radical Right 06/15/2014    Procedure: RIGHT MODIFIED RADICAL MASTECTOMY;  Surgeon: Shann Medal, MD;  Location: [facility];  Service: General;  Laterality: Right;  . Portacath placement N/A 06/15/2014    Procedure: INSERTION PORT-A-CATH LEFT SUBCLAVIAN;  Surgeon: Shann Medal, MD;  Location: [facility];  Service: General;  Laterality: N/A;  . Abdominal hysterectomy      There were no vitals filed for this visit.  Visit Diagnosis:  Unsteadiness on feet  Generalized weakness  Dominant hemiplegia  complicating stroke ([facility])  Abnormality of gait  Muscle weakness (generalized)      Subjective Assessment - 01/11/16 1452    Subjective had a pretty good weekend; no falls.    Patient Stated Goals "the walking and feel better in my back"   Currently in Pain? Yes   Pain Score 4    Pain Location Hip   Pain Orientation Right;Left   Pain Descriptors / Indicators Aching   Pain Onset More than a month ago   Pain Frequency Constant          [facility] Adult PT Treatment/Exercise - 01/11/16 1454    Balance   Balance Assessed Yes   Standardized Balance Assessment   Standardized Balance Assessment Berg Balance Test;Timed Up and Go Test   Berg Balance Test   Sit to Stand Able to stand  independently using hands   Standing Unsupported Able to stand 30 seconds unsupported   Sitting with Back Unsupported but Feet Supported on Floor or Stool Able to sit 30 seconds   Stand to Sit Controls descent by using hands   Transfers Needs one person to assist   Standing Unsupported with Eyes Closed Able to stand 10 seconds safely   Standing Ubsupported with Feet Together Needs help to attain position but able to stand for 30 seconds with feet together   From Standing, Reach Forward with Outstretched Arm Reaches forward but needs supervision  From Standing Position, Pick up Object from Floor Unable to pick up and needs supervision   From Standing Position, Turn to Look Behind Over each Shoulder Turn sideways only but maintains balance   Turn 360 Degrees Able to turn 360 degrees safely but slowly   Standing Unsupported, Alternately Place Feet on Step/Stool Needs assistance to keep from falling or unable to try   Standing Unsupported, One Foot in Front Needs help to step but can hold 15 seconds   Standing on One Leg Unable to try or needs assist to prevent fall   Total Score 23   Timed Up and Go Test   TUG Normal TUG   Normal TUG (seconds) 67          PT Long Term Goals - 01/11/16 1636    PT LONG  TERM GOAL #1   Title independent with HEP (01/12/16)   Status On-going   PT LONG TERM GOAL #2   Title improve timed up and go to < 50 sec for improved mobility (01/12/16)   Baseline 01/12/16 Patient scored 67 sec TUG.   Status On-going   PT LONG TERM GOAL #3   Title improve gait velocity to > 1.3 ft/sec for improved mobility and community access (01/12/16)   Status On-going   PT LONG TERM GOAL #4   Title perform BERG with goal to be written  >/= 27/56 (01/12/16)   Baseline 01/12/16 Patient BERG score 23/56  Patient may be able to reach 27 if tested at beginning of session due to being unable to stand/sit for 2 min unsupported due to fatigue, though possible in previous sessions.   Status Revised          Plan - 01/11/16 1635    Clinical Impression Statement Skilled session addressing some long term goals. Patient TUG score of 67 seconds. Patient BERG score of 23/56; patient has been able to stand and sit unsupported for 2 minutes in prior test and may have been limited by fatigue today. Patient may be able to meet goal score of 27/56 if tested at beginning of session before fatigued. Patient is making progress toward other LTGs.   PT Next Visit Plan Continue checking LTGs. Anticipated d/c this week.   Consulted and Agree with Plan of Care Patient      Problem List Patient Active Problem List   Diagnosis Date Noted  . Spastic hemiplegia affecting nondominant side ([place]) 11/29/2015  . Cerebral infarction due to thrombosis of basilar artery ([place])   . Acute CVA (cerebrovascular accident) ([place]) 09/14/2015  . CVA (cerebral infarction) 09/13/2015  . Hyperlipidemia 05/31/2014  . Pedal edema 05/11/2014  . Paranoia ([place]) 05/11/2014  . Breast cancer of upper-inner quadrant of right female breast ([place]) 04/15/2014  . History of pituitary tumor 01/28/2014  . Avascular necrosis of bones of both hips ([place]) 01/28/2014  . Genital herpes 01/28/2014  . Ovarian cyst, bilateral 01/26/2014  . Essential  hypertension, benign 01/26/2014  . Unspecified vitamin D deficiency 01/26/2014  . DJD (degenerative joint disease) 01/26/2014  . HIV disease [facility]) 01/07/2014    Bayard Beaver, SPTA 01/11/2016, 4:38 PM  [facility] [address] [place], [place], [address] Phone: [phone]   Fax:  [phone]  Name: Kelly Chavez MRN: [id number] Date of Birth: 1946-09-06  This note has been reviewed and edited by supervising CI.  Willow Ora, PTA, [facility] [address] [place], [place] [address] [phone] 01/12/2016, 3:45 PM

## 2016-01-11 NOTE — Therapy (Signed)
[facility] [address] [place], [place], [address] Phone: [phone]   Fax:  [phone]  Occupational Therapy Treatment  Patient Details  Name: Kelly Chavez MRN: [id number] Date of Birth: [birth year] Referring Provider: Dr Mamie Nick. Sethi  Encounter Date: 01/11/2016      OT End of Session - 01/11/16 1621    Visit Number 5   Number of Visits 17   Date for OT Re-Evaluation 02/16/16   Authorization Type Medicare - G code and progress note on 10th visit   Authorization - Visit Number 5   Authorization - Number of Visits 10   OT Start Time 1535   OT Stop Time 1615   OT Time Calculation (min) 40 min   Activity Tolerance Patient tolerated treatment well   Behavior During Therapy WFL for tasks assessed/performed      Past Medical History  Diagnosis Date  . 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 Cancer of sigmoid colon [facility]) 2011    sigmoid colectomy   . Status post chemotherapy 2012  . Brain tumor (benign) ([place])   . Avascular necrosis of bones of both hips ([place])   . Invasive ductal carcinoma of right breast ([place]) 04/09/14  . HIV (human immunodeficiency virus infection) ([place])   . Hypertension   . Anxiety   . Complication of anesthesia     senitive to all  . Stroke [facility])     Past Surgical History  Procedure Laterality Date  . Tonsillectomy    . Total abdominal hysterectomy w/ bilateral salpingoophorectomy  2012  . Colon surgery      partial colectomy  . Mastectomy modified radical Right 06/15/2014    Procedure: RIGHT MODIFIED RADICAL MASTECTOMY;  Surgeon: Shann Medal, MD;  Location: [facility];  Service: General;  Laterality: Right;  . Portacath placement N/A 06/15/2014    Procedure: INSERTION PORT-A-CATH LEFT SUBCLAVIAN;  Surgeon: Shann Medal, MD;  Location: [facility];  Service: General;  Laterality: N/A;  . Abdominal hysterectomy      There were no vitals filed for this visit.  Visit Diagnosis:   Unsteadiness on feet  Generalized weakness      Subjective Assessment - 01/11/16 1603    Subjective  I am so tired today, I don't fell like doing much of anything.   Patient Stated Goals I want to feel good about myself, I want to wash my feet.     Currently in Pain? No/denies   Pain Score 0-No pain                      OT Treatments/Exercises (OP) - 01/11/16 0001    ADLs   Grooming Paient has a goal of applying lotion to her legs, but is limited in hip flexibility.  Reviewed adaptive equipment for long handled lotion applicator.    Hand Exercises   Theraputty - Grip yellow    Theraputty - Pinch yellow   Theraputty - Locate Pegs yellow - 5 min+   Other Hand Exercises UBE X 5 min level 3   Fine Motor Coordination   Other Fine Motor Exercises Grooved pegboard, and in hand manipulation                OT Education - 01/11/16 1620    Education provided Yes   Education Details adaptive equipment for grooming   Person(s) Educated Patient   Methods Explanation;Handout   Comprehension Verbalized understanding  OT Short Term Goals - 01/07/16 1616    OT SHORT TERM GOAL #1   Title Patient will complete HEP with min cueing   Status On-going   OT SHORT TERM GOAL #2   Title Patient will bathe lower egs and feet with min cueing following set up   Status On-going   OT [place] #3   Title Patient will demonstrate sufficient strength to get onto and off of floor with mod assist as needed for tub transfer as desired   Status Achieved   OT SHORT TERM GOAL #4   Title Patient will demonstrate sufficient shoulder and elbow range of motion and be able to sustain overhead functional position long enough to brush and style hair with no more than 3 rest breaks.   Status Achieved           OT Long Term Goals - 01/07/16 1617    OT LONG TERM GOAL #1   Title Patient will complete a HEP / Home activity program independently    Status On-going   OT LONG  TERM GOAL #2   Title Patient will wash lower legs and feet with modified independence   Status On-going   OT LONG TERM GOAL #3   Title Patient will apply lotion to lower legs and feet with modified independence   Status On-going   OT LONG TERM GOAL #4   Title Patient will demonstrate adequate strength and range of motion in right and left shoulder and elbow to effectively brush and style hair into ponytail without rest breaks, and without physical assist.   Status Achieved               Plan - 01/11/16 1621    Clinical Impression Statement Patient with regular report of fatigue, making steady progress toward goal achievement   Pt will benefit from skilled therapeutic intervention in order to improve on the following deficits (Retired) Decreased activity tolerance;Decreased balance;Decreased cognition;Decreased mobility;Decreased knowledge of use of DME;Decreased knowledge of precautions;Decreased endurance;Decreased coordination;Decreased range of motion;Decreased safety awareness;Decreased strength;Impaired perceived functional ability;Increased edema;Difficulty walking;Impaired flexibility;Impaired tone;Impaired UE functional use;Pain   Rehab Potential Fair   OT Frequency 2x / week   OT Duration 8 weeks   OT Treatment/Interventions Self-care/ADL training;Moist Heat;Fluidtherapy;Ultrasound;Therapeutic exercise;Neuromuscular education;DME and/or AE instruction;Manual Therapy;Functional Mobility Training;Splinting;Therapeutic exercises;Therapeutic activities;Cognitive remediation/compensation;Patient/family education;Balance training   Plan light weight training, UBE,stand tolerance   Consulted and Agree with Plan of Care Patient        Problem List Patient Active Problem List   Diagnosis Date Noted  . Spastic hemiplegia affecting nondominant side ([place]) 11/29/2015  . Cerebral infarction due to thrombosis of basilar artery ([place])   . Acute CVA (cerebrovascular accident) ([place])  09/14/2015  . CVA (cerebral infarction) 09/13/2015  . Hyperlipidemia 05/31/2014  . Pedal edema 05/11/2014  . Paranoia ([place]) 05/11/2014  . Breast cancer of upper-inner quadrant of right female breast ([place]) 04/15/2014  . History of pituitary tumor 01/28/2014  . Avascular necrosis of bones of both hips ([place]) 01/28/2014  . Genital herpes 01/28/2014  . Ovarian cyst, bilateral 01/26/2014  . Essential hypertension, benign 01/26/2014  . Unspecified vitamin D deficiency 01/26/2014  . DJD (degenerative joint disease) 01/26/2014  . HIV disease ([place]) 01/07/2014    Mariah Milling, OTR/L 01/11/2016, 4:25 PM  [facility] [address] [place], [place], [address] Phone: [phone]   Fax:  [phone]  Name: Kelly Chavez MRN: [id number] Date of Birth: 1946/01/22

## 2016-01-13 ENCOUNTER — Encounter: Payer: Self-pay | Admitting: Occupational Therapy

## 2016-01-13 ENCOUNTER — Ambulatory Visit: Payer: Medicare Other | Admitting: Physical Therapy

## 2016-01-13 ENCOUNTER — Ambulatory Visit: Payer: Medicare Other | Admitting: Occupational Therapy

## 2016-01-13 ENCOUNTER — Encounter: Payer: Medicare Other | Admitting: Occupational Therapy

## 2016-01-13 ENCOUNTER — Encounter: Payer: Self-pay | Admitting: Physical Therapy

## 2016-01-13 DIAGNOSIS — R269 Unspecified abnormalities of gait and mobility: Secondary | ICD-10-CM

## 2016-01-13 DIAGNOSIS — R2681 Unsteadiness on feet: Secondary | ICD-10-CM

## 2016-01-13 DIAGNOSIS — I69359 Hemiplegia and hemiparesis following cerebral infarction affecting unspecified side: Secondary | ICD-10-CM

## 2016-01-13 DIAGNOSIS — R531 Weakness: Secondary | ICD-10-CM

## 2016-01-13 DIAGNOSIS — M6281 Muscle weakness (generalized): Secondary | ICD-10-CM

## 2016-01-13 NOTE — Therapy (Addendum)
[facility] [address] [place], [place], [address] Phone: [phone]   Fax:  [phone]  Physical Therapy Treatment  Patient Details  Name: Niccole Witthuhn MRN: [id number] Date of Birth: 11/18/1945 Referring Provider: Garvin Fila  Encounter Date: 01/13/2016      PT End of Session - 01/13/16 1628    Visit Number 6   Number of Visits 8   Date for PT Re-Evaluation 01/12/16   PT Start Time 3009  Patient needed to use restroom, delaying start of session.   PT Stop Time 1530   PT Time Calculation (min) 35 min   Equipment Utilized During Treatment Gait belt   Activity Tolerance Patient tolerated treatment well;Patient limited by fatigue   Behavior During Therapy WFL for tasks assessed/performed      Past Medical History  Diagnosis Date  . 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 Cancer of sigmoid colon [facility]) 2011    sigmoid colectomy   . Status post chemotherapy 2012  . Brain tumor (benign) ([place])   . Avascular necrosis of bones of both hips ([place])   . 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 HIV (human immunodeficiency virus infection) ([place])   . Hypertension   . Anxiety   . Complication of anesthesia     senitive to all  . Stroke [facility])     Past Surgical History  Procedure Laterality Date  . Tonsillectomy    . Total abdominal hysterectomy w/ bilateral salpingoophorectomy  2012  . Colon surgery      partial colectomy  . Mastectomy modified radical Right 06/15/2014    Procedure: RIGHT MODIFIED RADICAL MASTECTOMY;  Surgeon: Shann Medal, MD;  Location: [facility];  Service: General;  Laterality: Right;  . Portacath placement N/A 06/15/2014    Procedure: INSERTION PORT-A-CATH LEFT SUBCLAVIAN;  Surgeon: Shann Medal, MD;  Location: [facility];  Service: General;  Laterality: N/A;  . Abdominal hysterectomy      There were no vitals filed for this visit.  Visit Diagnosis:  Unsteadiness on  feet  Dominant hemiplegia complicating stroke (HCC)  Generalized weakness  Abnormality of gait  Muscle weakness (generalized)      Subjective Assessment - 01/13/16 1458    Subjective had a pretty good week so far. No falls.    Patient Stated Goals "the walking and feel better in my back"   Currently in Pain? Yes   Pain Score 4    Pain Location Leg   Pain Orientation Right   Pain Descriptors / Indicators Aching   Pain Onset Today   Pain Frequency Constant   Aggravating Factors  Being up on it.   Pain Relieving Factors Rest.           [facility] Adult PT Treatment/Exercise - 01/13/16 1504    Ambulation/Gait   Ambulation/Gait Yes   Ambulation/Gait Assistance 6: Modified independent (Device/Increase time);5: Supervision   Ambulation/Gait Assistance Details supervision with cues as noted, progressing to Mod I.    Ambulation Distance (Feet) 120 Feet   Assistive device 4-wheeled walker   Gait Pattern Decreased hip/knee flexion - right;Decreased hip/knee flexion - left;Right circumduction;Left circumduction   Ambulation Surface Level;Indoor   Gait velocity 1.15   Gait Comments Verbal cues to keep hips from externally rotating (patient notes feet point outward) and to decrease weight bearing through rollator as tolerated moving forward.    Berg Balance Test   Sit to Stand Able to stand  independently using  hands   Standing Unsupported Able to stand safely 2 minutes   Sitting with Back Unsupported but Feet Supported on Floor or Stool Able to sit safely and securely 2 minutes   Stand to Sit Controls descent by using hands   Transfers Needs one person to assist   Standing Unsupported with Eyes Closed Able to stand 10 seconds safely   Standing Ubsupported with Feet Together Needs help to attain position but able to stand for 30 seconds with feet together   From Standing, Reach Forward with Outstretched Arm Reaches forward but needs supervision   From Standing Position, Pick up Object  from Floor Unable to pick up and needs supervision   From Standing Position, Turn to Look Behind Over each Shoulder Turn sideways only but maintains balance   Turn 360 Degrees Able to turn 360 degrees safely but slowly   Standing Unsupported, Alternately Place Feet on Step/Stool Needs assistance to keep from falling or unable to try   Standing Unsupported, One Foot in Front Needs help to step but can hold 15 seconds   Standing on One Leg Unable to try or needs assist to prevent fall   Total Score 27      BERG: Only retested Standing/Sitting unsupported this session with other scores carried over from previous session.      PT Education - January 16, 2016 1627    Education provided Yes   Education Details Discussed continuing exercise program with community activities. Patient given list of community resources.   Person(s) Educated Patient   Methods Explanation;Handout   Comprehension Verbalized understanding          PT Long Term Goals - 16-Jan-2016 1638    PT LONG TERM GOAL #1   Title independent with HEP (01/12/16)   Status Achieved   PT LONG TERM GOAL #2   Title improve timed up and go to < 50 sec for improved mobility (01/12/16)   Baseline 01/12/16 Patient scored 67 sec TUG.   Status Not Met   PT LONG TERM GOAL #3   Title improve gait velocity to > 1.3 ft/sec for improved mobility and community access (01/12/16)   Baseline Patient recorded gait velocity of 1.15 ft/sec.   Status Not Met   PT LONG TERM GOAL #4   Title perform BERG with goal to be written  >/= 27/56 (01/12/16)   Baseline 01-16-16 Patient BERG score 27/56   Status Achieved          Plan - 01-16-2016 1629    Clinical Impression Statement Skilled session addressing remainder of LTGs. Patient met goal of BERG score of 27/56 to indicate decreased fall risk and goal of being independent with HEP. Patient fell short of TUG goal score of 50 seconds with 67 seconds and gait speed goal of 1.3 feet/second with 1.15 feet/second.     PT Next Visit Plan Discharge today.   Consulted and Agree with Plan of Care Patient          G-Codes - 2016/01/16 1002    Functional Assessment Tool Used gait velocity today: 1.15 ft/sec with rollator   Functional Limitation Mobility: Walking and moving around   Mobility: Walking and Moving Around Goal Status [phone]) At least 60 percent but less than 80 percent impaired, limited or restricted   Mobility: Walking and Moving Around Discharge Status [phone]) At least 80 percent but less than 100 percent impaired, limited or restricted       Problem List Patient Active Problem List   Diagnosis  Date Noted  . Spastic hemiplegia affecting nondominant side ([place]) 11/29/2015  . 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)   . Acute CVA (cerebrovascular accident) ([place]) 09/14/2015  . CVA (cerebral infarction) 09/13/2015  . Hyperlipidemia 05/31/2014  . Pedal edema 05/11/2014  . Paranoia ([place]) 05/11/2014  . Breast cancer of upper-inner quadrant of right female breast ([place]) 04/15/2014  . History of pituitary tumor 01/28/2014  . Avascular necrosis of bones of both hips ([place]) 01/28/2014  . Genital herpes 01/28/2014  . Ovarian cyst, bilateral 01/26/2014  . Essential hypertension, benign 01/26/2014  . Unspecified vitamin D deficiency 01/26/2014  . DJD (degenerative joint disease) 01/26/2014  . HIV disease [facility]) 01/07/2014    Bayard Beaver, SPTA 01/13/2016, 4:45 PM  [facility] [address] [place], [place], [address] Phone: [phone]   Fax:  [phone]  Name: Koula Venier MRN: [id number] Date of Birth: [birth year]  This note has been reviewed and edited by supervising CI.  Willow Ora, PTA, [facility] [address], [place], [place] [address] [phone] 01/14/2016, 10:03 AM   G-code completed by PT. Geoffry Paradise, PT,DPT 01/14/2016 10:58 AM Phone: [phone] Fax:  [phone]     PHYSICAL THERAPY DISCHARGE SUMMARY  Visits from Start of Care: 6  Current functional level related to goals / functional outcomes: See above; pt met 2/4 LTGs   Remaining deficits: Pt continues to be high fall risk but due to AVN of bil hips and without medical/surgical intervention this is unlikely to change.  Pt at baseline for mobility at this time and has achieved maximal rehab potential.   Education / Equipment: HEP  Plan: Patient agrees to discharge.  Patient goals were partially met. Patient is being discharged due to lack of progress.  ?????   Laureen Abrahams, PT, DPT 01/18/2016 12:44 PM  [facility] [address] [place], [place] [address]  [phone] (office) [phone] (fax)

## 2016-01-13 NOTE — Patient Instructions (Addendum)
Therapy band exercises right shoulder  Shoulder punch: Seated in chair:   Wrap band around both hands Place left hand on chest and use right hand to "punch" straight forward- shoulder height, straightening your elbow.   Do not let your arm snap back- move in slow and controlled manner.   Repeat 10 times  Complete 3 sets  Punch High: Same position as previous exercise punch up toward eye level Do not let your arm snap back- slow and controlled motion. Repeat 10 times  Complete 3 sets

## 2016-01-13 NOTE — Therapy (Signed)
[facility] [address] [place], [place], [address] Phone: [phone]   Fax:  [phone]  Occupational Therapy Treatment  Patient Details  Name: Kelly Chavez MRN: [id number] Date of Birth: 20-Jul-1946 Referring Provider: Dr Mamie Nick. Sethi  Encounter Date: 01/13/2016      OT End of Session - 01/13/16 1617    Visit Number 6   Number of Visits 17   Date for OT Re-Evaluation 02/16/16   Authorization Type Medicare - G code and progress note on 10th visit   Authorization - Visit Number 6   Authorization - Number of Visits 10   OT Start Time 1535   OT Stop Time 1616   OT Time Calculation (min) 41 min      Past Medical History  Diagnosis Date  . 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 Cancer of sigmoid colon [facility]) 2011    sigmoid colectomy   . Status post chemotherapy 2012  . Brain tumor (benign) ([facility])   . Avascular necrosis of bones of both hips ([place])   . Invasive ductal carcinoma of right breast ([place]) 04/09/14  . HIV (human immunodeficiency virus infection) ([place])   . Hypertension   . Anxiety   . Complication of anesthesia     senitive to all  . Stroke [facility])     Past Surgical History  Procedure Laterality Date  . Tonsillectomy    . Total abdominal hysterectomy w/ bilateral salpingoophorectomy  2012  . Colon surgery      partial colectomy  . Mastectomy modified radical Right 06/15/2014    Procedure: RIGHT MODIFIED RADICAL MASTECTOMY;  Surgeon: Shann Medal, MD;  Location: [facility];  Service: General;  Laterality: Right;  . Portacath placement N/A 06/15/2014    Procedure: INSERTION PORT-A-CATH LEFT SUBCLAVIAN;  Surgeon: Shann Medal, MD;  Location: [facility];  Service: General;  Laterality: N/A;  . Abdominal hysterectomy      There were no vitals filed for this visit.  Visit Diagnosis:  Unsteadiness on feet  Dominant hemiplegia complicating stroke ([place])  Generalized weakness      Subjective  Assessment - 01/13/16 1554    Subjective  I fell this has been very beneficial- coming here.     Pertinent History     Patient Stated Goals I want to feel good about myself, I want to wash my feet.     Currently in Pain? Yes   Pain Score 4    Pain Location Hip   Pain Orientation Right;Left   Pain Descriptors / Indicators Aching   Pain Type Chronic pain   Pain Onset More than a month ago   Pain Frequency Constant   Aggravating Factors  Position change - stand to sit   Pain Relieving Factors rest                      OT Treatments/Exercises (OP) - 01/13/16 0001    ADLs   Bathing Patient understands how to use adaptive equipment for improving care of lower legs and feet.  Patient has not yet purchased.  Patient given instructions - address / phone of local home medical supply store near her home.     Shoulder Exercises: Seated   Horizontal ABduction AROM;Strengthening;Both   Theraband Level (Shoulder Horizontal ABduction) Level 1 (Yellow)   Flexion AROM;Strengthening;Right;Theraband;10 reps   Theraband Level (Shoulder Flexion) Level 1 (Yellow)   Abduction AROM;Strengthening;Right;10 reps  OT Education - 2016-01-19 1615    Education provided Yes   Education Details Home exercise program right shoulder strengthening   Person(s) Educated Patient   Methods Explanation;Handout;Demonstration   Comprehension Verbalized understanding;Returned demonstration          OT Short Term Goals - 01/19/16 1610    OT SHORT TERM GOAL #1   Title Patient will complete HEP with min cueing   Status Achieved   OT SHORT TERM GOAL #2   Title Patient will bathe lower egs and feet with min cueing following set up   Status Partially Met   OT [place] #3   Title Patient will demonstrate sufficient strength to get onto and off of floor with mod assist as needed for tub transfer as desired   Status Achieved   OT SHORT TERM GOAL #4   Status Achieved            OT Long Term Goals - 01-19-2016 1611    OT LONG TERM GOAL #1   Title Patient will complete a HEP / Home activity program independently    Status Achieved   OT LONG TERM GOAL #2   Title Patient will wash lower legs and feet with modified independence   Status Partially Met   OT LONG TERM GOAL #3   Title Patient will apply lotion to lower legs and feet with modified independence   Status Partially Met   OT LONG TERM GOAL #4   Title Patient will demonstrate adequate strength and range of motion in right and left shoulder and elbow to effectively brush and style hair into ponytail without rest breaks, and without physical assist.   Status Achieved               Plan - 01-19-16 1617    Clinical Impression Statement Patient very pleased with progress - goals met, discharge OT services   Pt will benefit from skilled therapeutic intervention in order to improve on the following deficits (Retired) Decreased activity tolerance;Decreased balance;Decreased cognition;Decreased mobility;Decreased knowledge of use of DME;Decreased knowledge of precautions;Decreased endurance;Decreased coordination;Decreased range of motion;Decreased safety awareness;Decreased strength;Impaired perceived functional ability;Increased edema;Difficulty walking;Impaired flexibility;Impaired tone;Impaired UE functional use;Pain   Rehab Potential Fair   OT Frequency 2x / week   OT Duration 8 weeks   OT Treatment/Interventions Self-care/ADL training;Moist Heat;Fluidtherapy;Ultrasound;Therapeutic exercise;Neuromuscular education;DME and/or AE instruction;Manual Therapy;Functional Mobility Training;Splinting;Therapeutic exercises;Therapeutic activities;Cognitive remediation/compensation;Patient/family education;Balance training   Plan discharge OT   OT Home Exercise Plan Reviewed HEP- Shoulder, Added theraband - yellow   Consulted and Agree with Plan of Care Patient          G-Codes - 2016/01/19 1619    Functional  Assessment Tool Used skilled clinical intervention   Functional Limitation Carrying, moving and handling objects   Carrying, Moving and Handling Objects Current Status (J1941) At least 20 percent but less than 40 percent impaired, limited or restricted   Carrying, Moving and Handling Objects Goal Status (D4081) At least 20 percent but less than 40 percent impaired, limited or restricted   Carrying, Moving and Handling Objects Discharge Status [phone]) At least 20 percent but less than 40 percent impaired, limited or restricted      Problem List Patient Active Problem List   Diagnosis Date Noted  . Spastic hemiplegia affecting nondominant side ([place]) 11/29/2015  . Cerebral infarction due to thrombosis of basilar artery ([place])   . Acute CVA (cerebrovascular accident) ([place]) 09/14/2015  . CVA (cerebral infarction) 09/13/2015  . Hyperlipidemia 05/31/2014  .  Pedal edema 05/11/2014  . Paranoia ([place]) 05/11/2014  . Breast cancer of upper-inner quadrant of right female breast ([place]) 04/15/2014  . History of pituitary tumor 01/28/2014  . Avascular necrosis of bones of both hips ([place]) 01/28/2014  . Genital herpes 01/28/2014  . Ovarian cyst, bilateral 01/26/2014  . Essential hypertension, benign 01/26/2014  . Unspecified vitamin D deficiency 01/26/2014  . DJD (degenerative joint disease) 01/26/2014  . HIV disease ([place]) 01/07/2014    OCCUPATIONAL THERAPY DISCHARGE SUMMARY  Visits from Start of Care: 6  Current functional level related to goals / functional outcomes: Patient demonstrates improved functional range of motion and strength in right UE as evidenced by improved overhead ability with ADL/IADL.   Remaining deficits: Limitations in hip range of motion, hip pain - longstanding  Education / Equipment: Adaptive equipment to aide with lower body care  Plan: Patient agrees to discharge.  Patient goals were met. Patient is being discharged due to meeting the stated rehab goals.  ?????        Mariah Milling, OTR/L 01/13/2016, 4:22 PM  [facility] [address] [place], [place], [address] Phone: [phone]   Fax:  [phone]  Name: Nevaeh Korte MRN: [id number] Date of Birth: 06-02-1946

## 2016-01-13 NOTE — Patient Instructions (Signed)
Community Occupational psychologist of Services Cost  A Matter of Balance Class locations vary. Call [place] on Aging for more information.  http://dawson-may.[email or web address]/ [phone] 8-Session program addressing the fear of falling and increasing activity levels of older adults Free to minimal cost  A.C.T. By [employer] [address] [place], [place] [address].  BetaBlues.dk [phone]  Personal training, gym, classes including Silver Sneakers* and ACTion for Aging Adults Fee-based  A.H.O.Y. (Add Health to [place]) Airs on Time [employer] 13, M-F at [place]: [employer],  [employer] [employer] [place],  [place], [place] [facility], [address] Surgical [facility] [place], [place], [place], [place] [address]  [place] Location: Sharrell Ku. [employer] [place]      [phone]  [phone]  [phone]  [phone]  [phone]  [phone]  [phone]  [phone]  [phone]  [phone]    [phone] A total-body conditioning class for adults 75 and older; designed to increase muscular strength, endurance, range of movement, flexibility, balance, agility and coordination Free  [facility] [place], [place] [address] [place]      [address]      [phone]      Pilate's class for individualsreturning to exercise after an injury, before or after surgery or for individuals with complex musculoskeletal issues; designed to improve strength, balance , flexibility      $15/class  [place] [address] [place], [place] [address] www.[email or web address].dk [place] classes for beginners to advanced Bonifay Leota, [place] [address] Seniorcenter_0 -[email or web address]-[email or web address].[email or web address] [place] Chair Exercises Free, ages 64 and older; Ages 65-59 fee based  Marvia Pickles, [facility] [address] [place], [place] [address] Seniorcenter_1 .Beverlee Nims [phone]  A.H.O.Y. Tai Chi Fee-based Donation based or free  [place] Class locations vary.  Call or email Angela Burke or view website for more information. Info_[email or web address] .com GainPain.com.cy.html [phone] Ongoing classes at local YMC[employer] and gyms Fee-based  Silver Sneakers A.C.T. By [place] [employer]: [place] [phone]  [phone] Classes designed for older adults who want to improve their strength, flexibility, balance and endurance.   Silver sneakers is covered by some insurance plans and includes a fitness center membership at participating locations. Find out more by calling [phone] or visiting www.[email or web address] Covered by some insurance plans  [facility] [place] [phone] A.H.O.Y., fitness room, personal training, fitness classes for injury prevention, strength, balance, flexibility, water fitness classes Ages 55+: $49 for 6 months; Ages 37-54: $49 for 6 months  Tai Chi for Everybody [facility] [address] [place], [place] [address] Taichiforeverybody_3 .Patsi Sears [phone] Tai Chi classes for beginners to advanced; geared for seniors Donation Based  [employer] ([employer] Others Participate in Exercise     Loyal Gambler. Rosana Hoes, PhD, [place] pgdavis_0 .edu [place]     [phone]     A comprehensive fitness program for adults.  The program paris senior-level undergraduates Kinesiology students with adults who desire to learn how to exercise safely.  Includes a structural exercise class focusing on functional fitnesss     $100/semester in fall and spring; $75 in summer (no trainers)    *Silver Sneakers is covered by some Personal assistant and includes a  Radio producer at participating locations.  Find out more by calling [phone] or visiting [email or web address].com  For additional health and human services resources for senior adults, please contact SeniorLine at [phone] in [place] and [place] at [phone] in all other areas.

## 2016-01-31 ENCOUNTER — Other Ambulatory Visit: Payer: Self-pay | Admitting: Internal Medicine

## 2016-01-31 DIAGNOSIS — B20 Human immunodeficiency virus [HIV] disease: Secondary | ICD-10-CM

## 2016-01-31 MED ORDER — DARUNAVIR ETHANOLATE 800 MG PO TABS: 800.0000 mg | ORAL_TABLET | Freq: Every day | ORAL | Status: DC

## 2016-01-31 MED ORDER — EMTRICITABINE-TENOFOVIR DF 200-300 MG PO TABS: 1.0000 | ORAL_TABLET | Freq: Every day | ORAL | Status: DC

## 2016-02-03 ENCOUNTER — Ambulatory Visit (HOSPITAL_BASED_OUTPATIENT_CLINIC_OR_DEPARTMENT_OTHER): Payer: Medicare Other

## 2016-02-03 VITALS — BP 150/68 | HR 55 | Temp 97.9°F | Resp 18

## 2016-02-03 DIAGNOSIS — C50211 Malignant neoplasm of upper-inner quadrant of right female breast: Secondary | ICD-10-CM | POA: Diagnosis present

## 2016-02-03 DIAGNOSIS — Z95828 Presence of other vascular implants and grafts: Secondary | ICD-10-CM

## 2016-02-03 MED ORDER — HEPARIN SOD (PORK) LOCK FLUSH 100 UNIT/ML IV SOLN
500.0000 [IU] | Freq: Once | INTRAVENOUS | Status: AC
  Administered 2016-02-03: 500 [IU] via INTRAVENOUS
  Filled 2016-02-03: qty 5

## 2016-02-03 MED ORDER — SODIUM CHLORIDE 0.9% FLUSH
10.0000 mL | INTRAVENOUS | Status: DC | PRN
  Administered 2016-02-03: 10 mL via INTRAVENOUS
  Filled 2016-02-03: qty 10

## 2016-02-03 NOTE — Patient Instructions (Signed)

## 2016-03-09 ENCOUNTER — Ambulatory Visit (INDEPENDENT_AMBULATORY_CARE_PROVIDER_SITE_OTHER): Payer: Medicare Other | Admitting: Internal Medicine

## 2016-03-09 ENCOUNTER — Encounter: Payer: Self-pay | Admitting: *Deleted

## 2016-03-09 ENCOUNTER — Encounter: Payer: Self-pay | Admitting: Internal Medicine

## 2016-03-09 VITALS — BP 165/79 | HR 68 | Temp 97.8°F | Ht 64.0 in | Wt 184.0 lb

## 2016-03-09 DIAGNOSIS — I6350 Cerebral infarction due to unspecified occlusion or stenosis of unspecified cerebral artery: Secondary | ICD-10-CM | POA: Diagnosis not present

## 2016-03-09 DIAGNOSIS — B20 Human immunodeficiency virus [HIV] disease: Secondary | ICD-10-CM

## 2016-03-09 NOTE — Progress Notes (Signed)
Patient ID: Kelly Chavez, female   DOB: 03-11-1946, 70 y.o.   MRN: [id number]       Patient ID: Kelly Chavez, female   DOB: 06-16-1946, 70 y.o.   MRN: [id number]  HPI 70yo F with HIV disease, breast ca s/p mastectomy and refused further treatment. Hx of pontine stroke. Cd 4 count of 500/VL<20 ( nov 2017). Currently on truvada/prezista-norvir. We last saw her in February still has some residual neuro deficits. She finished occupational rehab at the end of March. She reports doing well with her health, starting to see therapist  ROS: she reports feeling "magnetic pull on her whole body" different from gravity  She sees dr. [name] [place] as her PCP  Outpatient Encounter Prescriptions as of 03/09/2016  Medication Sig  . aspirin EC 325 MG tablet Take 1 tablet (325 mg total) by mouth daily.  Marland Kitchen atenolol (TENORMIN) 50 MG tablet Take 1 tablet (50 mg total) by mouth daily.  . Darunavir Ethanolate (PREZISTA) 800 MG tablet Take 1 tablet (800 mg total) by mouth daily.  Marland Kitchen emtricitabine-tenofovir (TRUVADA) 200-300 MG tablet Take 1 tablet by mouth daily.  Marland Kitchen lidocaine-prilocaine (EMLA) cream APPLY 1 APPLICATION TOPICALLY AS NEEDED.  Marland Kitchen NORVIR 100 MG TABS tablet TAKE 1 TABLET BY MOUTH DAILY  . atorvastatin (LIPITOR) 20 MG tablet   . atorvastatin (LIPITOR) 40 MG tablet Take 1 tablet (40 mg total) by mouth daily at 6 PM. (Patient not taking: Reported on 03/09/2016)  . levothyroxine (SYNTHROID, LEVOTHROID) 75 MCG tablet   . Vitamin D, Ergocalciferol, (DRISDOL) 50000 units CAPS capsule   . [DISCONTINUED] chlorhexidine gluconate (PERIDEX) 0.12 % solution Use as directed 15 mLs in the mouth or throat 2 (two) times daily. (Patient not taking: Reported on 03/09/2016)   No facility-administered encounter medications on file as of 03/09/2016.     Patient Active Problem List   Diagnosis Date Noted  . Spastic hemiplegia affecting nondominant side ([place]) 11/29/2015  . Cerebral infarction due to thrombosis of basilar  artery ([place])   . Acute CVA (cerebrovascular accident) ([place]) 09/14/2015  . CVA (cerebral infarction) 09/13/2015  . Hyperlipidemia 05/31/2014  . Pedal edema 05/11/2014  . Paranoia ([place]) 05/11/2014  . Breast cancer of upper-inner quadrant of right female breast ([place]) 04/15/2014  . History of pituitary tumor 01/28/2014  . Avascular necrosis of bones of both hips ([place]) 01/28/2014  . Genital herpes 01/28/2014  . Ovarian cyst, bilateral 01/26/2014  . Essential hypertension, benign 01/26/2014  . Unspecified vitamin D deficiency 01/26/2014  . DJD (degenerative joint disease) 01/26/2014  . HIV disease ([place]) 01/07/2014     Health Maintenance Due  Topic Date Due  . TETANUS/TDAP  10/21/1965  . COLONOSCOPY  10/21/1996  . ZOSTAVAX  10/21/2006  . DEXA SCAN  10/22/2011  . PNA vac Low Risk Adult (2 of 2 - PCV13) 01/23/2015     Review of Systems + paranoia, 10 point ros is negative Physical Exam   BP 165/79 mmHg  Pulse 68  Temp(Src) 97.8 F (36.6 C) (Oral)  Ht 5\' 4"  (1.626 m)  Wt 184 lb (83.462 kg)  BMI 31.57 kg/m2 Physical Exam  Constitutional:  oriented to person, place, and time. appears well-developed and well-nourished. No distress.  HENT: [place]/AT, PERRLA, no scleral icterus Mouth/Throat: Oropharynx is clear and moist. No oropharyngeal exudate.  Cardiovascular: Normal rate, regular rhythm and normal heart sounds. Exam reveals no gallop and no friction rub.  No murmur heard.  Chest wall: right mastectomy incisional scar is well healed Pulmonary/Chest: Effort  normal and breath sounds normal. No respiratory distress.  has no wheezes.  Neck = supple, no nuchal rigidity Abdominal: Soft. Bowel sounds are normal.  exhibits no distension. There is no tenderness.  Lymphadenopathy: no cervical adenopathy. No axillary adenopathy Neurological: alert and oriented to person, place, and time.  Skin: Skin is warm and dry. No rash noted. No erythema.  Psychiatric: guarded, mild paranoia  Lab  Results  Component Value Date   CD4TCELL 26* 08/17/2015   Lab Results  Component Value Date   CD4TABS 500 08/17/2015   CD4TABS 490 03/18/2015   CD4TABS 560 10/20/2014   Lab Results  Component Value Date   HIV1RNAQUANT <20 08/17/2015   Lab Results  Component Value Date   HEPBSAB NEG 01/22/2014   No results found for: RPR  CBC Lab Results  Component Value Date   WBC 5.5 09/13/2015   RBC 3.93 09/13/2015   HGB 11.8* 09/13/2015   HCT 38.2 09/13/2015   PLT 211 09/13/2015   MCV 97.2 09/13/2015   MCH 30.0 09/13/2015   MCHC 30.9 09/13/2015   RDW 13.6 09/13/2015   LYMPHSABS 1.8 09/13/2015   MONOABS 0.6 09/13/2015   EOSABS 0.0 09/13/2015   BASOSABS 0.0 09/13/2015   BMET Lab Results  Component Value Date   NA 139 09/13/2015   K 3.6 09/13/2015   CL 107 09/13/2015   CO2 25 09/13/2015   GLUCOSE 91 09/13/2015   BUN 19 09/13/2015   CREATININE 0.88 09/13/2015   CALCIUM 8.7* 09/13/2015   GFRNONAA >60 09/13/2015   GFRAA >60 09/13/2015     Assessment and Plan  hiv disease = will check cd 4 count and viral load at next port a cath flush  Paranoia/delusion = she meets with counselor but yet to see psychiatrist and she is not want referral at this time. Will reach out to pcp to see if she can also recommend psychiatry eval.  Breast ca = she has deferred further treatment beyond mastectomy. Has folow up in jun 2017 for further assessment with onc  Pontine stroke = has appt with dr. Leonie Man in aug 2017 in follow up

## 2016-03-09 NOTE — Progress Notes (Signed)
Received a call from Clifton Custard, at [facility] for Infectious Disease, regarding add on labs. Dr. Karolee Ohs would like labs to be drawn on 03/30/16 when patient comes for a flush appointment at the [facility]. Per lab, they will be able to see lab orders entered into EP[facility]. This RN returned Michelle's call and left a voice mail message informing her what lab stated. Michelle's return number is [phone]. POF sent to scheduling to add on a lab appointment. Instructed Sharyn Lull to call [phone] if she had any questions or concerns.

## 2016-03-28 ENCOUNTER — Other Ambulatory Visit: Payer: Self-pay | Admitting: *Deleted

## 2016-03-28 ENCOUNTER — Other Ambulatory Visit: Payer: Self-pay | Admitting: Internal Medicine

## 2016-03-28 ENCOUNTER — Telehealth: Payer: Self-pay | Admitting: Oncology

## 2016-03-28 DIAGNOSIS — I10 Essential (primary) hypertension: Secondary | ICD-10-CM

## 2016-03-28 DIAGNOSIS — C50211 Malignant neoplasm of upper-inner quadrant of right female breast: Secondary | ICD-10-CM

## 2016-03-28 NOTE — Telephone Encounter (Signed)
returned call and s.w. pt and confirmed appt....pt ok and aware °

## 2016-03-28 NOTE — Telephone Encounter (Signed)
Apt added per stacey, she will notify pt

## 2016-03-30 ENCOUNTER — Other Ambulatory Visit (HOSPITAL_BASED_OUTPATIENT_CLINIC_OR_DEPARTMENT_OTHER): Payer: Medicare Other

## 2016-03-30 ENCOUNTER — Ambulatory Visit (HOSPITAL_BASED_OUTPATIENT_CLINIC_OR_DEPARTMENT_OTHER): Payer: Medicare Other

## 2016-03-30 ENCOUNTER — Ambulatory Visit (HOSPITAL_BASED_OUTPATIENT_CLINIC_OR_DEPARTMENT_OTHER): Payer: Medicare Other | Admitting: Oncology

## 2016-03-30 ENCOUNTER — Telehealth: Payer: Self-pay | Admitting: Oncology

## 2016-03-30 VITALS — BP 166/64 | HR 59 | Temp 98.3°F | Resp 18 | Ht 64.0 in | Wt 180.4 lb

## 2016-03-30 DIAGNOSIS — B20 Human immunodeficiency virus [HIV] disease: Secondary | ICD-10-CM

## 2016-03-30 DIAGNOSIS — I639 Cerebral infarction, unspecified: Secondary | ICD-10-CM | POA: Diagnosis not present

## 2016-03-30 DIAGNOSIS — C50211 Malignant neoplasm of upper-inner quadrant of right female breast: Secondary | ICD-10-CM

## 2016-03-30 DIAGNOSIS — C50911 Malignant neoplasm of unspecified site of right female breast: Secondary | ICD-10-CM | POA: Diagnosis present

## 2016-03-30 DIAGNOSIS — Z95828 Presence of other vascular implants and grafts: Secondary | ICD-10-CM

## 2016-03-30 DIAGNOSIS — C773 Secondary and unspecified malignant neoplasm of axilla and upper limb lymph nodes: Secondary | ICD-10-CM | POA: Diagnosis not present

## 2016-03-30 LAB — CBC WITH DIFFERENTIAL/PLATELET
BASO%: 0.9 % (ref 0.0–2.0)
Basophils Absolute: 0 10*3/uL (ref 0.0–0.1)
EOS%: 1.8 % (ref 0.0–7.0)
Eosinophils Absolute: 0.1 10*3/uL (ref 0.0–0.5)
HCT: 38.6 % (ref 34.8–46.6)
HEMOGLOBIN: 12.2 g/dL (ref 11.6–15.9)
LYMPH%: 30.2 % (ref 14.0–49.7)
MCH: 30 pg (ref 25.1–34.0)
MCHC: 31.6 g/dL (ref 31.5–36.0)
MCV: 95.1 fL (ref 79.5–101.0)
MONO#: 0.4 10*3/uL (ref 0.1–0.9)
MONO%: 9.4 % (ref 0.0–14.0)
NEUT%: 57.7 % (ref 38.4–76.8)
NEUTROS ABS: 2.7 10*3/uL (ref 1.5–6.5)
Platelets: 187 10*3/uL (ref 145–400)
RBC: 4.06 10*6/uL (ref 3.70–5.45)
RDW: 13.4 % (ref 11.2–14.5)
WBC: 4.6 10*3/uL (ref 3.9–10.3)
lymph#: 1.4 10*3/uL (ref 0.9–3.3)

## 2016-03-30 LAB — COMPREHENSIVE METABOLIC PANEL
ALBUMIN: 3.4 g/dL — AB (ref 3.5–5.0)
ALK PHOS: 112 U/L (ref 40–150)
ALT: 20 U/L (ref 0–55)
AST: 20 U/L (ref 5–34)
Anion Gap: 7 mEq/L (ref 3–11)
BUN: 14.6 mg/dL (ref 7.0–26.0)
CO2: 26 meq/L (ref 22–29)
Calcium: 9.3 mg/dL (ref 8.4–10.4)
Chloride: 109 mEq/L (ref 98–109)
Creatinine: 0.8 mg/dL (ref 0.6–1.1)
EGFR: 83 mL/min/{1.73_m2} — AB (ref >90)
GLUCOSE: 77 mg/dL (ref 70–140)
Potassium: 4.1 mEq/L (ref 3.5–5.1)
SODIUM: 142 meq/L (ref 136–145)
TOTAL PROTEIN: 7.9 g/dL (ref 6.4–8.3)

## 2016-03-30 MED ORDER — HEPARIN SOD (PORK) LOCK FLUSH 100 UNIT/ML IV SOLN
500.0000 [IU] | Freq: Once | INTRAVENOUS | Status: AC
  Administered 2016-03-30: 500 [IU] via INTRAVENOUS
  Filled 2016-03-30: qty 5

## 2016-03-30 MED ORDER — SODIUM CHLORIDE 0.9% FLUSH
10.0000 mL | INTRAVENOUS | Status: DC | PRN
  Administered 2016-03-30: 10 mL via INTRAVENOUS
  Filled 2016-03-30: qty 10

## 2016-03-30 NOTE — Patient Instructions (Signed)

## 2016-03-30 NOTE — Progress Notes (Signed)
Hematology and Oncology Follow Up Visit  Kelly Chavez [id number] [birth year] 70 y.o. 03/30/2016 11:27 AM Kelly Chavez., MDShelton, Kelly Millin, MD   Principle Diagnosis: 70 year old woman with stage IIB (T2 N1) right-sided breast cancer. 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   Prior Therapy:  She is status post biopsy on 04/09/2014 which confirmed the presence of invasive carcinoma with axillary lymph node involvement. She is status post right modified radical mastectomy and insertion of a Port-A-Cath done on 06/15/2014. Her final pathology revealed T4bN1a. Her tumor showed lymphovascular invasion with involvement of the nipple but not the nipple and dermal lymphatics. One out of 16 lymph nodes involved.  Current therapy: Observation and surveillance. She declined adjuvant therapy.  Interim History:  [name] presents today for a followup visit. Since her last visit, she missed multiple appointments after she developed a CVA in November 2016. She was found to have an acute infarct involving the left pons. She presented with acute confusion and altered mental status. After her hospitalization to received rehabilitation and have been doing reasonably well. She does not have any neurological deficits but does report some episodic confusion and paranoia.  She continues to live independently and does not report any major changes in her quality of life. He is ambulating with the help of walker and does not report any falls or syncope. Does not report any specific symptoms related to her breast cancer including chest discomfort or difficulty breathing. Her mood have been relatively stable.  She does not report any constitutional symptoms of fevers or chills or sweats. Has not reported any weight loss or appetite changes. She has not reported any headaches or blurry vision or syncope. Not reporting any chest  pain shortness of breath cough or hemoptysis. She she has not reported any nausea or vomiting or abdominal pain. Has not reported any frequency urgency or hesitancy. Has not reported any skeletal complaints. Has not reported any arthralgias or myalgias. Rest of the review of systems unremarkable.    Medications: I have reviewed the patient's current medications.  Current Outpatient Prescriptions  Medication Sig Dispense Refill  . aspirin EC 325 MG tablet Take 1 tablet (325 mg total) by mouth daily. 30 tablet 0  . atenolol (TENORMIN) 50 MG tablet TAKE 1 TABLET BY MOUTH EVERY DAY 90 tablet 1  . atorvastatin (LIPITOR) 20 MG tablet     . Darunavir Ethanolate (PREZISTA) 800 MG tablet Take 1 tablet (800 mg total) by mouth daily. 90 tablet 3  . emtricitabine-tenofovir (TRUVADA) 200-300 MG tablet Take 1 tablet by mouth daily. 90 tablet 3  . levothyroxine (SYNTHROID, LEVOTHROID) 75 MCG tablet     . lidocaine-prilocaine (EMLA) cream APPLY 1 APPLICATION TOPICALLY AS NEEDED. 30 g 0  . NORVIR 100 MG TABS tablet TAKE 1 TABLET BY MOUTH DAILY 90 tablet 2  . Vitamin D, Ergocalciferol, (DRISDOL) 50000 units CAPS capsule      No current facility-administered medications for this visit.     Allergies: No Known Allergies  Past Medical History, Surgical history, Social history, and Family History were reviewed and updated.  Physical Exam: Blood pressure 166/64, pulse 59, temperature 98.3 F (36.8 C), temperature source Oral, resp. rate 18, height 5' 4"  (1.626 m), weight 180 lb 6.4 oz (81.829 kg), SpO2 100 %. ECOG: 1 General appearance: Well-appearing woman appeared without distress. Head: Normocephalic, without obvious abnormality no oral ulcers or lesions. Neck: no adenopathy  Lymph nodes: Cervical, supraclavicular, and axillary nodes normal. Heart:regular rate and rhythm, S1, S2 normal, no murmur, click, rub or gallop Chest wall examination:  Port-A-Cath in place without any complications. Lung: Clear  without wheezes, rhonchi or dullness to percussion. Abdomin: soft, non-tender, without masses or organomegaly no shifting dullness or ascites. EXT:no erythema, induration, or nodules. Slight arm edema noted. Skin showed no rashes or lesions.  Lab Results: Lab Results  Component Value Date   WBC 4.6 03/30/2016   HGB 12.2 03/30/2016   HCT 38.6 03/30/2016   MCV 95.1 03/30/2016   PLT 187 03/30/2016     Chemistry      Component Value Date/Time   NA 139 09/13/2015 1345   NA 141 05/26/2015 1307   K 3.6 09/13/2015 1345   K 4.2 05/26/2015 1307   CL 107 09/13/2015 1345   CO2 25 09/13/2015 1345   CO2 24 05/26/2015 1307   BUN 19 09/13/2015 1345   BUN 16.7 05/26/2015 1307   CREATININE 0.88 09/13/2015 1345   CREATININE 1.08* 08/17/2015 1216   CREATININE 1.0 05/26/2015 1307      Component Value Date/Time   CALCIUM 8.7* 09/13/2015 1345   CALCIUM 9.3 05/26/2015 1307   ALKPHOS 89 09/13/2015 1345   ALKPHOS 119 05/26/2015 1307   AST 30 09/13/2015 1345   AST 22 05/26/2015 1307   ALT 22 09/13/2015 1345   ALT 24 05/26/2015 1307   BILITOT 0.5 09/13/2015 1345   BILITOT 0.28 05/26/2015 1307       Impression and Plan:  70 year old woman with the following issues:  1. Invasive ductal carcinoma of the right breast presented with a 4 cm mass as well as palpable adenopathy. She is status post mastectomy with her tumor found to be T4b.N1a disease with nipple involvement on 06/15/2014. She declined adjuvant chemotherapy on multiple occasions.  PET scan on 05/27/2015 showed no evidence of metastatic disease. The plan at this point is to continue with active surveillance and you systemic chemotherapy she develops metastatic disease. She have declined adjuvant therapy and might consider systemic chemotherapy if she develops metastatic disease. I will repeat imaging studies in September 2017.    2. IV access: Port-A-Cath inserted and will be utilized for systemic chemotherapy. This will be flushed  every 6 weeks.  3. Adjuvant radiation therapy: She declined that as well. She has no evidence of local recurrence.  4. Anxiety/depression: We will have been stable at this time.  5. HIV: Followed by Dr. Baxter Flattery and appears to be reasonably stable.  6. 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 She appears to be recovering reasonably well months time and continues to follow with neurology.  7. Follow-up: Will be in 6 weeks for a Port-A-Cath flush and in 3 months for a clinical visit after imaging studies.  [facility], MD 6/15/201711:27 AM

## 2016-03-30 NOTE — Telephone Encounter (Signed)
per pof to asch pt appt-gave pt copy of avs

## 2016-05-18 ENCOUNTER — Ambulatory Visit (HOSPITAL_BASED_OUTPATIENT_CLINIC_OR_DEPARTMENT_OTHER): Payer: Medicare Other

## 2016-05-18 DIAGNOSIS — C773 Secondary and unspecified malignant neoplasm of axilla and upper limb lymph nodes: Secondary | ICD-10-CM

## 2016-05-18 DIAGNOSIS — Z452 Encounter for adjustment and management of vascular access device: Secondary | ICD-10-CM | POA: Diagnosis present

## 2016-05-18 DIAGNOSIS — Z95828 Presence of other vascular implants and grafts: Secondary | ICD-10-CM

## 2016-05-18 DIAGNOSIS — C50911 Malignant neoplasm of unspecified site of right female breast: Secondary | ICD-10-CM | POA: Diagnosis not present

## 2016-05-18 MED ORDER — SODIUM CHLORIDE 0.9% FLUSH
10.0000 mL | INTRAVENOUS | Status: DC | PRN
  Administered 2016-05-18: 10 mL via INTRAVENOUS
  Filled 2016-05-18: qty 10

## 2016-05-18 MED ORDER — HEPARIN SOD (PORK) LOCK FLUSH 100 UNIT/ML IV SOLN
500.0000 [IU] | Freq: Once | INTRAVENOUS | Status: AC
  Administered 2016-05-18: 500 [IU] via INTRAVENOUS
  Filled 2016-05-18: qty 5

## 2016-05-31 ENCOUNTER — Ambulatory Visit (INDEPENDENT_AMBULATORY_CARE_PROVIDER_SITE_OTHER): Payer: Medicare Other | Admitting: Neurology

## 2016-05-31 ENCOUNTER — Encounter: Payer: Self-pay | Admitting: Neurology

## 2016-05-31 VITALS — BP 132/81 | HR 71 | Ht 64.0 in | Wt 182.0 lb

## 2016-05-31 DIAGNOSIS — G8110 Spastic hemiplegia affecting unspecified side: Secondary | ICD-10-CM | POA: Diagnosis not present

## 2016-05-31 DIAGNOSIS — I6350 Cerebral infarction due to unspecified occlusion or stenosis of unspecified cerebral artery: Secondary | ICD-10-CM

## 2016-05-31 MED ORDER — BACLOFEN 5 MG HALF TABLET: 5.0000 mg | ORAL_TABLET | Freq: Two times a day (BID) | ORAL | Status: AC

## 2016-05-31 NOTE — Patient Instructions (Signed)
I had a long d/w patient about her remote stroke, spatic hemiplegia,risk for recurrent stroke/TIAs, personally independently reviewed imaging studies and stroke evaluation results and answered questions.Continue aspirin 325 mg daily  for secondary stroke prevention and maintain strict control of hypertension with blood pressure goal below 130/90, diabetes with hemoglobin A1c goal below 6.5% and lipids with LDL cholesterol goal below 70 mg/dL. I also advised the patient to eat a healthy diet with plenty of whole grains, cereals, fruits and vegetables, exercise regularly and maintain ideal body weight. I counseled her to resume taking Lipitor daily and have follow-up lipid profile checked in 2 months by her primary physician. Trial of baclofen 5 mg twice daily for 2 weeks to be increased to 10 mg twice daily if tolerated to help with her left leg spasticity and cramps. Followup in the future with stroke NP in 6 months or call earlier if needed.

## 2016-05-31 NOTE — Progress Notes (Signed)
[facility] [address] [place]. [place] [address] [phone]       OFFICE FOLLOW-UP NOTE  Ms. Kelly Chavez Date of Birth:  05-05-1946 Medical Record Number:  [id number]   HPI: 70 year old lady being seen today for first office follow-up visit following hospital admission for stroke in November 2016. Kelly Chavez is an 70 y.o. female with a past medical history that is relevant for HTN, HIV positive complicated by PCP, colon cancer s/p colectomy, right breast cancer s/p mastectomy, who initially presented to [facility] with 12 hours of confusion, disorientation, and reported new left face weakness and slurred speech by family. Her son is at the bedside and said that yesterday he noticed that she was slurring her words, and then her left face became droopy. He got concerned because she did not improve and decided to take her to the ED at [facility]. She was LKW 09/12/2015, unable to determine time. She denies associated HA, vertigo, focal weakness or numbness, double vision, difficulty swallowing, langauge or visual impairment. CT brain showed no acute abnormality. MRI/MRA brain demonstrated an acute left pontine infarct and moderate to marked narrowing mid aspect basilar artery respectively. Patient was not administered TPA secondary to out of the window. She was admitted for further evaluation and treatment. CT scan of the head on admission showed small vessel ischemic changes without acute infarct per MRI scan did show left pontine paramedian infarct. MRA of the brain showed mild to moderate narrowing of the distal left vertebral artery. Carotid ultrasound showed no significant expectoration stenosis. LDL cholesterol was elevated at 140 mg percent and hemoglobin A1c was 6.0. Patient was seen by physical occupational therapy and was discharged home with home therapy. She was started on aspirin and Lipitor 40 mg. Patient states she is not tolerating Lipitor and she has history of statin  myalgias. She is on aspirin which is tolerating well. She is finished home physical and occupation therapy but is able to walk with a wheeled walker but she still has some dragging of the left leg and some diminished fine motor skills in the left hand. She states that she is not taking blood pressure medication but her blood pressure is elevated today at 182/86 and she attributes this to anxiety as she is not quite sure why she is seeing me today. Update 05/31/2016 ; she returns for follow-up after last visit 6 months ago. She states she is stable from stroke standpoint without recurrent stroke or TIA symptoms. She has noticed slight improvement in left-sided strength. She is able to use her left hand more now. She continues to walk with a cane or a wheeled walker. She complains of still dragging as well as cramps and pain and stiffness in her left leg. She is tolerating aspirin without bleeding or bruising. She states her blood pressure is good and today it is 130/81. Patient stopped taking Lipitor as she read some negative news about side effects and benefits. She has had no new medical problems other health changes since her last visit. ROS:   14 system review of systems is positive for   leg pain, cramps, difficulty walking, and weakness and all the systems negative and all other systems negative  PMH:  Past Medical History:  Diagnosis Date  . Anxiety   . Avascular necrosis of bones of both hips ([place])   . Brain tumor (benign) ([place])   . Cancer of sigmoid colon [facility]) 2011   sigmoid colectomy   . Complication of anesthesia  senitive to all  . HIV (human immunodeficiency virus infection) ([place])   . Hypertension   . Invasive ductal carcinoma of right breast ([place]) 04/09/14  . 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 Status post chemotherapy 2012  . Stroke [facility])     Social History:  Social History   Social History  . Marital status: Widowed    Spouse name: N/A  . Number of  children: N/A  . Years of education: N/A   Occupational History  . Not on file.   Social History Main Topics  . Smoking status: Never Smoker  . Smokeless tobacco: Never Used  . Alcohol use No  . Drug use: No  . Sexual activity: Not Currently    Partners: Male    Birth control/ protection: Abstinence   Other Topics Concern  . Not on file   Social History Narrative  . No narrative on file    Medications:   Current Outpatient Prescriptions on File Prior to Visit  Medication Sig Dispense Refill  . aspirin EC 325 MG tablet Take 1 tablet (325 mg total) by mouth daily. 30 tablet 0  . atenolol (TENORMIN) 50 MG tablet TAKE 1 TABLET BY MOUTH EVERY DAY 90 tablet 1  . atorvastatin (LIPITOR) 20 MG tablet     . Darunavir Ethanolate (PREZISTA) 800 MG tablet Take 1 tablet (800 mg total) by mouth daily. 90 tablet 3  . emtricitabine-tenofovir (TRUVADA) 200-300 MG tablet Take 1 tablet by mouth daily. 90 tablet 3  . levothyroxine (SYNTHROID, LEVOTHROID) 75 MCG tablet     . lidocaine-prilocaine (EMLA) cream APPLY 1 APPLICATION TOPICALLY AS NEEDED. 30 g 0  . NORVIR 100 MG TABS tablet TAKE 1 TABLET BY MOUTH DAILY 90 tablet 2  . Vitamin D, Ergocalciferol, (DRISDOL) 50000 units CAPS capsule      No current facility-administered medications on file prior to visit.     Allergies:  No Known Allergies  Physical Exam General:   middle-aged  lady, seated, in no evident distress Head: head normocephalic and atraumatic.  Neck: supple with no carotid or supraclavicular bruits Cardiovascular: regular rate and rhythm, no murmurs Musculoskeletal: no deformity Skin:  no rash/petichiae Vascular:  Normal pulses all extremities Vitals:   05/31/16 1337  BP: 132/81  Pulse: 71   Neurologic Exam Mental Status: Awake and fully alert. Oriented to place and time. Recent and remote memory intact. Attention span, concentration and fund of knowledge appropriate. Mood and affect appropriate.  Cranial Nerves:  Fundoscopic exam reveals sharp disc margins. Pupils equal, briskly reactive to light. Extraocular movements full without nystagmus. Visual fields full to confrontation. Hearing intact. Facial sensation intact. Face, tongue, palate moves normally and symmetrically.  Motor: Normal bulk and tone. Normal strength in  right extremity muscles. Mild left hemiparesis with 4/5 strength with weakness of left grip and intrinsic hand muscles. Orbits right over left approximately. Increased tone in the left leg with mild spasticity. Sensory.: intact to touch ,pinprick .position and vibratory sensation.  Coordination: Rapid alternating movements are diminished on the left side and normal on the right.  Gait and Station: Arises from chair with mild  difficulty. Stance is broad-based. Uses a wheeled walker with slight dragging of the left leg and stiffness.  Reflexes: 2+ and asymmetric and brisker on the left. Toes downgoing.   NIHSS  3 Modified Rankin  3  ASSESSMENT: 41 year Caucasian lady with left pontine lacunar stroke in November 2016 from small vessel disease. Vascular risk factors of  Hypertension  and hyperlipidemia only patient has post stroke spasticity with left leg discomfort pain and cramps    PLAN: I had a long d/w patient about her remote stroke, spatic hemiplegia,risk for recurrent stroke/TIAs, personally independently reviewed imaging studies and stroke evaluation results and answered questions.Continue aspirin 325 mg daily  for secondary stroke prevention and maintain strict control of hypertension with blood pressure goal below 130/90, diabetes with hemoglobin A1c goal below 6.5% and lipids with LDL cholesterol goal below 70 mg/dL. I also advised the patient to eat a healthy diet with plenty of whole grains, cereals, fruits and vegetables, exercise regularly and maintain ideal body weight. I counseled her to resume taking Lipitor daily and have follow-up lipid profile checked in 2 months by her  primary physician. Trial of baclofen 5 mg twice daily for 2 weeks to be increased to 10 mg twice daily if tolerated to help with her left leg spasticity and cramps. Followup in the future with stroke NP in 6 months or call earlier if needed..Greater than 50% of time during this 25 minute visit was spent on counseling,explanation of diagnosis, planning of further management, discussion with patient and family and coordination of care Followup in the future with me in 6 months or call earlier if necessary. Antony Contras, MD   Note: This document was prepared with digital dictation and possible smart phrase technology. Any transcriptional errors that result from this process are unintentional

## 2016-06-13 ENCOUNTER — Ambulatory Visit (INDEPENDENT_AMBULATORY_CARE_PROVIDER_SITE_OTHER): Payer: Medicare Other | Admitting: Internal Medicine

## 2016-06-13 ENCOUNTER — Encounter: Payer: Self-pay | Admitting: Internal Medicine

## 2016-06-13 VITALS — BP 183/93 | HR 51 | Temp 97.3°F | Wt 180.0 lb

## 2016-06-13 DIAGNOSIS — I10 Essential (primary) hypertension: Secondary | ICD-10-CM

## 2016-06-13 DIAGNOSIS — I6350 Cerebral infarction due to unspecified occlusion or stenosis of unspecified cerebral artery: Secondary | ICD-10-CM | POA: Diagnosis not present

## 2016-06-13 DIAGNOSIS — B20 Human immunodeficiency virus [HIV] disease: Secondary | ICD-10-CM | POA: Diagnosis not present

## 2016-06-13 DIAGNOSIS — R001 Bradycardia, unspecified: Secondary | ICD-10-CM

## 2016-06-13 DIAGNOSIS — R21 Rash and other nonspecific skin eruption: Secondary | ICD-10-CM | POA: Diagnosis not present

## 2016-06-13 MED ORDER — EMTRICITABINE-TENOFOVIR AF 200-25 MG PO TABS
1.0000 | ORAL_TABLET | Freq: Every day | ORAL | Status: DC
Start: 2016-06-13 — End: 2016-06-13

## 2016-06-13 MED ORDER — EMTRICITABINE-TENOFOVIR AF 200-25 MG PO TABS: 1.0000 | ORAL_TABLET | Freq: Every day | ORAL | 11 refills | Status: DC

## 2016-06-13 NOTE — Progress Notes (Signed)
RFV: follow up on HIV disease  Patient ID: Kelly Chavez, female   DOB: October 17, 1945, 70 y.o.   MRN: [id number]  HPI 70yo F with HIV disease, CD 4 count of 500/VL<20 on truvada/DRVr. She also has hx of breast cancer for which she got a mastectomy but declined chemo. She has episodes of paranoia, untreated. Deferred getting psychiatric care. She is interested in new medications.  Outpatient Encounter Prescriptions as of 06/13/2016  Medication Sig  . aspirin EC 325 MG tablet Take 1 tablet (325 mg total) by mouth daily.  Marland Kitchen atenolol (TENORMIN) 50 MG tablet TAKE 1 TABLET BY MOUTH EVERY DAY  . atorvastatin (LIPITOR) 20 MG tablet   . Darunavir Ethanolate (PREZISTA) 800 MG tablet Take 1 tablet (800 mg total) by mouth daily.  Marland Kitchen emtricitabine-tenofovir (TRUVADA) 200-300 MG tablet Take 1 tablet by mouth daily.  Marland Kitchen levothyroxine (SYNTHROID, LEVOTHROID) 75 MCG tablet   . lidocaine-prilocaine (EMLA) cream APPLY 1 APPLICATION TOPICALLY AS NEEDED.  Marland Kitchen NORVIR 100 MG TABS tablet TAKE 1 TABLET BY MOUTH DAILY  . Vitamin D, Ergocalciferol, (DRISDOL) 50000 units CAPS capsule    Facility-Administered Encounter Medications as of 06/13/2016  Medication  . baclofen (LIORESAL) tablet 5 mg     Patient Active Problem List   Diagnosis Date Noted  . Spastic hemiplegia affecting nondominant side ([place]) 11/29/2015  . 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 Acute CVA (cerebrovascular accident) ([place]) 09/14/2015  . CVA (cerebral infarction) 09/13/2015  . Hyperlipidemia 05/31/2014  . Pedal edema 05/11/2014  . Paranoia ([place]) 05/11/2014  . Breast cancer of upper-inner quadrant of right female breast ([place]) 04/15/2014  . History of pituitary tumor 01/28/2014  . Avascular necrosis of bones of both hips ([place]) 01/28/2014  . Genital herpes 01/28/2014  . Ovarian cyst, bilateral 01/26/2014  . Essential hypertension, benign 01/26/2014  . Unspecified vitamin D deficiency 01/26/2014  . DJD (degenerative  joint disease) 01/26/2014  . HIV disease ([place]) 01/07/2014     Health Maintenance Due  Topic Date Due  . TETANUS/TDAP  10/21/1965  . COLONOSCOPY  10/21/1996  . ZOSTAVAX  10/21/2006  . DEXA SCAN  10/22/2011  . PNA vac Low Risk Adult (2 of 2 - PCV13) 01/23/2015  . MAMMOGRAM  04/09/2016  . INFLUENZA VACCINE  05/16/2016     Review of Systems  Physical Exam   BP (!) 183/93   Pulse (!) 51   Temp 97.3 F (36.3 C) (Oral)   Wt 180 lb (81.6 kg)   BMI 30.90 kg/m  Physical Exam  Constitutional:  oriented to person, place, and time. appears well-developed and well-nourished. No distress.  HENT: [place]/AT, PERRLA, no scleral icterus Mouth/Throat: Oropharynx is clear and moist. No oropharyngeal exudate.  Cardiovascular: Normal rate, regular rhythm and normal heart sounds. Exam reveals no gallop and no friction rub.  No murmur heard.  Pulmonary/Chest: Effort normal and breath sounds normal. No respiratory distress.  has no wheezes.  Neck = supple, no nuchal rigidity Abdominal: Soft. Bowel sounds are normal.  exhibits no distension. There is no tenderness.  Lymphadenopathy: no cervical adenopathy. No axillary adenopathy Neurological: alert and oriented to person, place, and time.  Skin: Skin is warm and dry. No rash noted. No erythema.  Psychiatric: a normal mood and affect.  behavior is normal.   Lab Results  Component Value Date   CD4TCELL 26 (L) 08/17/2015   Lab Results  Component Value Date   CD4TABS 500 08/17/2015   CD4TABS 490 03/18/2015  CD4TABS 560 10/20/2014   Lab Results  Component Value Date   HIV1RNAQUANT <20 08/17/2015   Lab Results  Component Value Date   HEPBSAB NEG 01/22/2014   No results found for: RPR  CBC Lab Results  Component Value Date   WBC 4.6 03/30/2016   RBC 4.06 03/30/2016   HGB 12.2 03/30/2016   HCT 38.6 03/30/2016   PLT 187 03/30/2016   MCV 95.1 03/30/2016   MCH 30.0 03/30/2016   MCHC 31.6 03/30/2016   RDW 13.4 03/30/2016   LYMPHSABS  1.4 03/30/2016   MONOABS 0.4 03/30/2016   EOSABS 0.1 03/30/2016   BASOSABS 0.0 03/30/2016   BMET Lab Results  Component Value Date   NA 142 03/30/2016   K 4.1 03/30/2016   CL 107 09/13/2015   CO2 26 03/30/2016   GLUCOSE 77 03/30/2016   BUN 14.6 03/30/2016   CREATININE 0.8 03/30/2016   CALCIUM 9.3 03/30/2016   GFRNONAA >60 09/13/2015   GFRAA >60 09/13/2015     Assessment and Plan   hiv disease = well controlled but would like to change her off of TDF to TAF regimen. We will arrange for hiv labs to be drawn at infusion center the next time they flush her port on sept 13th. She is amenable to changing from truvada to descovy. Then come back in 1 month to discuss drv/r to prezcobix  Rash = now resolved. Unclear if it was drug rash. Asked her to come back when it happens so that it can be monitor.  Bradycardia = she is on atenolol 50mg  for HTN, started by other provider for many years. Will recommend to stop this medication  htn = will switch to hctz 25mg  instead of atenolol  Health maintenance =deferred flu vaccine at this visit

## 2016-06-28 ENCOUNTER — Other Ambulatory Visit (HOSPITAL_BASED_OUTPATIENT_CLINIC_OR_DEPARTMENT_OTHER): Payer: Medicare Other

## 2016-06-28 ENCOUNTER — Ambulatory Visit (HOSPITAL_BASED_OUTPATIENT_CLINIC_OR_DEPARTMENT_OTHER): Payer: Medicare Other

## 2016-06-28 DIAGNOSIS — C50911 Malignant neoplasm of unspecified site of right female breast: Secondary | ICD-10-CM

## 2016-06-28 DIAGNOSIS — C50211 Malignant neoplasm of upper-inner quadrant of right female breast: Secondary | ICD-10-CM

## 2016-06-28 DIAGNOSIS — Z95828 Presence of other vascular implants and grafts: Secondary | ICD-10-CM | POA: Insufficient documentation

## 2016-06-28 LAB — COMPREHENSIVE METABOLIC PANEL
ALT: 28 U/L (ref 0–55)
AST: 25 U/L (ref 5–34)
Albumin: 3.3 g/dL — ABNORMAL LOW (ref 3.5–5.0)
Alkaline Phosphatase: 133 U/L (ref 40–150)
Anion Gap: 9 mEq/L (ref 3–11)
BUN: 13.7 mg/dL (ref 7.0–26.0)
CALCIUM: 9.2 mg/dL (ref 8.4–10.4)
CHLORIDE: 109 meq/L (ref 98–109)
CO2: 25 mEq/L (ref 22–29)
Creatinine: 0.9 mg/dL (ref 0.6–1.1)
EGFR: 77 mL/min/{1.73_m2} — AB (ref >90)
Glucose: 86 mg/dl (ref 70–140)
POTASSIUM: 4.3 meq/L (ref 3.5–5.1)
Sodium: 143 mEq/L (ref 136–145)
Total Bilirubin: <0.3 mg/dL (ref 0.20–1.20)
Total Protein: 7.9 g/dL (ref 6.4–8.3)

## 2016-06-28 LAB — CBC WITH DIFFERENTIAL/PLATELET
BASO%: 0.2 % (ref 0.0–2.0)
BASOS ABS: 0 10*3/uL (ref 0.0–0.1)
EOS%: 2.2 % (ref 0.0–7.0)
Eosinophils Absolute: 0.1 10*3/uL (ref 0.0–0.5)
HEMATOCRIT: 38.9 % (ref 34.8–46.6)
HGB: 12.4 g/dL (ref 11.6–15.9)
LYMPH%: 36.5 % (ref 14.0–49.7)
MCH: 30 pg (ref 25.1–34.0)
MCHC: 31.9 g/dL (ref 31.5–36.0)
MCV: 94 fL (ref 79.5–101.0)
MONO#: 0.4 10*3/uL (ref 0.1–0.9)
MONO%: 8.3 % (ref 0.0–14.0)
NEUT#: 2.7 10*3/uL (ref 1.5–6.5)
NEUT%: 52.8 % (ref 38.4–76.8)
Platelets: 218 10*3/uL (ref 145–400)
RBC: 4.14 10*6/uL (ref 3.70–5.45)
RDW: 13.8 % (ref 11.2–14.5)
WBC: 5 10*3/uL (ref 3.9–10.3)
lymph#: 1.8 10*3/uL (ref 0.9–3.3)

## 2016-06-28 MED ORDER — HEPARIN SOD (PORK) LOCK FLUSH 100 UNIT/ML IV SOLN
500.0000 [IU] | Freq: Once | INTRAVENOUS | Status: AC | PRN
  Administered 2016-06-28: 500 [IU] via INTRAVENOUS
  Filled 2016-06-28: qty 5

## 2016-06-28 MED ORDER — SODIUM CHLORIDE 0.9 % IJ SOLN
10.0000 mL | INTRAMUSCULAR | Status: DC | PRN
  Administered 2016-06-28: 10 mL via INTRAVENOUS
  Filled 2016-06-28: qty 10

## 2016-06-28 NOTE — Patient Instructions (Signed)

## 2016-06-29 ENCOUNTER — Ambulatory Visit (HOSPITAL_COMMUNITY)
Admission: RE | Admit: 2016-06-29 | Discharge: 2016-06-29 | Disposition: A | Discharge status: Home / Self Care | Payer: Medicare Other | Source: Ambulatory Visit | Attending: Oncology | Admitting: Oncology

## 2016-06-29 DIAGNOSIS — N289 Disorder of kidney and ureter, unspecified: Secondary | ICD-10-CM | POA: Insufficient documentation

## 2016-06-29 DIAGNOSIS — I700 Atherosclerosis of aorta: Secondary | ICD-10-CM | POA: Insufficient documentation

## 2016-06-29 DIAGNOSIS — I2510 Atherosclerotic heart disease of native coronary artery without angina pectoris: Secondary | ICD-10-CM | POA: Insufficient documentation

## 2016-06-29 DIAGNOSIS — Z853 Personal history of malignant neoplasm of breast: Secondary | ICD-10-CM | POA: Diagnosis not present

## 2016-06-29 DIAGNOSIS — Z9011 Acquired absence of right breast and nipple: Secondary | ICD-10-CM | POA: Diagnosis not present

## 2016-06-29 DIAGNOSIS — K469 Unspecified abdominal hernia without obstruction or gangrene: Secondary | ICD-10-CM | POA: Insufficient documentation

## 2016-06-29 DIAGNOSIS — C50211 Malignant neoplasm of upper-inner quadrant of right female breast: Secondary | ICD-10-CM

## 2016-06-29 DIAGNOSIS — Z08 Encounter for follow-up examination after completed treatment for malignant neoplasm: Secondary | ICD-10-CM | POA: Insufficient documentation

## 2016-06-29 MED ORDER — IOPAMIDOL (ISOVUE-300) INJECTION 61%
100.0000 mL | Freq: Once | INTRAVENOUS | Status: AC | PRN
  Administered 2016-06-29: 100 mL via INTRAVENOUS

## 2016-06-30 ENCOUNTER — Ambulatory Visit (HOSPITAL_BASED_OUTPATIENT_CLINIC_OR_DEPARTMENT_OTHER): Payer: Medicare Other | Admitting: Oncology

## 2016-06-30 ENCOUNTER — Telehealth: Payer: Self-pay | Admitting: Oncology

## 2016-06-30 VITALS — BP 160/61 | HR 67 | Temp 98.4°F | Resp 17 | Ht 64.0 in | Wt 173.8 lb

## 2016-06-30 DIAGNOSIS — B20 Human immunodeficiency virus [HIV] disease: Secondary | ICD-10-CM | POA: Diagnosis not present

## 2016-06-30 DIAGNOSIS — Z853 Personal history of malignant neoplasm of breast: Secondary | ICD-10-CM | POA: Diagnosis present

## 2016-06-30 DIAGNOSIS — C50211 Malignant neoplasm of upper-inner quadrant of right female breast: Secondary | ICD-10-CM

## 2016-06-30 NOTE — Progress Notes (Signed)
Hematology and Oncology Follow Up Visit  Kelly Chavez [id number] 1946-08-07 70 y.o. 06/30/2016 10:57 AM Kelly Chavez., MDShelton, [name], MD   Principle Diagnosis: 70 year old woman with stage IIB (T2 N1) right-sided breast cancer. 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   Prior Therapy:  She is status post biopsy on 04/09/2014 which confirmed the presence of invasive carcinoma with axillary lymph node involvement. She is status post right modified radical mastectomy and insertion of a Port-A-Cath done on 06/15/2014. Her final pathology revealed T4bN1a. Her tumor showed lymphovascular invasion with involvement of the nipple but not the nipple and dermal lymphatics. One out of 16 lymph nodes involved.  Current therapy: Observation and surveillance. She declined adjuvant therapy.  Interim History:  Kelly Chavez presents today for a followup visit. Since her last visit, she reports no major changes in her health. She remains ambulatory with the help of a walker and continues to drive and attends to activities of daily living. Although her mobility is slow but she is able to accomplish that.   She denied any new symptoms or decline in her energy or performance status. Her appetite remain excellent without any weight loss. She has not reported any masses or lesions. Has not reported any constitutional symptoms.   She has not reported any headaches or blurry vision or syncope. Not reporting any chest pain shortness of breath cough or hemoptysis. She she has not reported any nausea or vomiting or abdominal pain. Has not reported any frequency urgency or hesitancy. Has not reported any skeletal complaints. Has not reported any arthralgias or myalgias. Rest of the review of systems unremarkable.    Medications: I have reviewed the patient's current medications.  Current Outpatient Prescriptions   Medication Sig Dispense Refill  . aspirin EC 325 MG tablet Take 1 tablet (325 mg total) by mouth daily. 30 tablet 0  . atenolol (TENORMIN) 50 MG tablet TAKE 1 TABLET BY MOUTH EVERY DAY 90 tablet 1  . atorvastatin (LIPITOR) 20 MG tablet     . Darunavir Ethanolate (PREZISTA) 800 MG tablet Take 1 tablet (800 mg total) by mouth daily. 90 tablet 3  . emtricitabine-tenofovir AF (DESCOVY) 200-25 MG tablet Take 1 tablet by mouth daily. 30 tablet 11  . levothyroxine (SYNTHROID, LEVOTHROID) 75 MCG tablet     . lidocaine-prilocaine (EMLA) cream APPLY 1 APPLICATION TOPICALLY AS NEEDED. 30 g 0  . NORVIR 100 MG TABS tablet TAKE 1 TABLET BY MOUTH DAILY 90 tablet 2  . Vitamin D, Ergocalciferol, (DRISDOL) 50000 units CAPS capsule      Current Facility-Administered Medications  Medication Dose Route Frequency Provider Last Rate Last Dose  . baclofen (LIORESAL) tablet 5 mg  5 mg Oral BID Garvin Fila, MD         Allergies: No Known Allergies  Past Medical History, Surgical history, Social history, and Family History were reviewed and updated.  Physical Exam: Blood pressure (!) 160/61, pulse 67, temperature 98.4 F (36.9 C), temperature source Oral, resp. rate 17, height 5' 4" (1.626 m), weight 173 lb 12.8 oz (78.8 kg), SpO2 100 %. ECOG: 1 General appearance: Alert, awake woman without distress. Head: Normocephalic, without obvious abnormality no oral thrush noted. Neck: no adenopathy Lymph nodes: Cervical, supraclavicular, and axillary nodes normal. Heart:regular rate and rhythm, S1, S2 normal, no murmur, click, rub or gallop Chest wall examination:  Port-A-Cath in place . There are no erythema or  induration noted. Lung: Clear without wheezes, rhonchi or dullness to percussion. Abdomin: soft, non-tender, without masses or organomegaly no rebound or guarding. EXT:no erythema, induration, or nodules. Slight arm edema noted. Skin showed no rashes or lesions.  Lab Results: Lab Results  Component  Value Date   WBC 5.0 06/28/2016   HGB 12.4 06/28/2016   HCT 38.9 06/28/2016   MCV 94.0 06/28/2016   PLT 218 06/28/2016     Chemistry      Component Value Date/Time   NA 143 06/28/2016 1052   K 4.3 06/28/2016 1052   CL 107 09/13/2015 1345   CO2 25 06/28/2016 1052   BUN 13.7 06/28/2016 1052   CREATININE 0.9 06/28/2016 1052      Component Value Date/Time   CALCIUM 9.2 06/28/2016 1052   ALKPHOS 133 06/28/2016 1052   AST 25 06/28/2016 1052   ALT 28 06/28/2016 1052   BILITOT <0.30 06/28/2016 1052     EXAM: CT CHEST, ABDOMEN, AND PELVIS WITH CONTRAST  TECHNIQUE: Multidetector CT imaging of the chest, abdomen and pelvis was performed following the standard protocol during bolus administration of intravenous contrast.  CONTRAST:  175m ISOVUE-300 IOPAMIDOL (ISOVUE-300) INJECTION 61%  COMPARISON:  PET-CT 05/27/2015  FINDINGS: CT CHEST FINDINGS  Cardiovascular: Coronary artery calcification and aortic atherosclerotic calcification.  Mediastinum/Nodes: RIGHT axillary nodal dissection. There is thickening in the RIGHT axilla adjacent to the lymphadenectomy site measuring 2.1 by 1.7 cm (image 19, series 2) and unchanged from comparison PET-CT scan. This tissue was not significantly metabolic at that time. Patient status post RIGHT mastectomy. Port in the LEFT chest wall. No LEFT axillary adenopathy. RIGHT supraclavicular lymph node measures 10 mm on image 8, series 2. This node is not changed from prior and was not hypermetabolic. No mediastinal lymphadenopathy. No pericardial fluid. Small hiatal hernia noted.  Lungs/Pleura: No suspicious pulmonary nodules.  No pleural disease  Musculoskeletal: No aggressive osseous lesion.  CT ABDOMEN AND PELVIS FINDINGS  Hepatobiliary: No focal hepatic lesion. No biliary ductal dilatation. Gallbladder is normal. Common bile duct is normal.  Pancreas: Pancreas is normal. No ductal dilatation. No  pancreatic inflammation.  Spleen: Normal spleen  Adrenals/urinary tract: Adrenal glands normal. Low-density lesion in knee LEFT kidney RIGHT kidney is simple fluid attenuation. Ureters and bladder normal.  Stomach/Bowel: Small hiatal hernia. Stomach, small-bowel, and colon are unremarkable.  Vascular/Lymphatic: Abdominal aorta is normal caliber. There is no retroperitoneal or periportal lymphadenopathy. No pelvic lymphadenopathy.  Reproductive: Post hysterectomy.  No adnexal abnormality.  Other: No peritoneal metastasis  Musculoskeletal: No aggressive osseous lesion.  IMPRESSION: Chest Impression:  1. Postsurgical change in the RIGHT axilla without evidence of local recurrence. 2. Stable small RIGHT supraclavicular lymph node which was not hypermetabolic on comparison PET-CT scan. 3. No evidence of breast cancer recurrence in the thorax.  Abdomen / Pelvis Impression:  1. No evidence of metastatic disease in the abdomen or pelvis. 2. Benign-appearing RIGHT renal lesion.   Impression and Plan:  70year old woman with the following issues:   1. Invasive ductal carcinoma of the right breast presented with a 4 cm mass as well as palpable adenopathy. She is status post mastectomy with her tumor found to be T4b.N1a disease with nipple involvement on 06/15/2014. She declined adjuvant chemotherapy on multiple occasions.  CT scan on 06/29/2016 was personally reviewed and discussed with the patient. She has no evidence to suggest recurrent disease at this time and she continues to decline adjuvant therapy. She is close to 2 years out now from her  surgery and any systemic therapy will be used lately forward would be in the salvage setting.  She has not had a mammogram in the last 2 years and we will arrange for for her to have that done in the near future.    2. IV access: Port-A-Cath inserted and will be utilized for systemic chemotherapy. This will be flushed every 8  weeks.   3. HIV: Followed by Dr. Baxter Flattery and appears to be reasonably stable.  4. 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 Recovered well at this time without any major deficits.  5. Follow-up: Will be in 6 months and certainly sooner if needed to.  [facility], MD 9/15/201710:57 AM

## 2016-06-30 NOTE — Telephone Encounter (Signed)
Avs report and appointment schedule given to patient, per 06/30/16 los. °

## 2016-08-01 ENCOUNTER — Other Ambulatory Visit: Payer: Self-pay | Admitting: Oncology

## 2016-08-01 DIAGNOSIS — C50211 Malignant neoplasm of upper-inner quadrant of right female breast: Secondary | ICD-10-CM

## 2016-08-22 ENCOUNTER — Ambulatory Visit (INDEPENDENT_AMBULATORY_CARE_PROVIDER_SITE_OTHER): Payer: Medicare Other | Admitting: Internal Medicine

## 2016-08-22 ENCOUNTER — Encounter: Payer: Self-pay | Admitting: Internal Medicine

## 2016-08-22 VITALS — BP 156/85 | HR 74 | Temp 97.7°F | Wt 183.0 lb

## 2016-08-22 DIAGNOSIS — B20 Human immunodeficiency virus [HIV] disease: Secondary | ICD-10-CM

## 2016-08-22 DIAGNOSIS — I6350 Cerebral infarction due to unspecified occlusion or stenosis of unspecified cerebral artery: Secondary | ICD-10-CM

## 2016-08-22 NOTE — Progress Notes (Signed)
Patient ID: Kelly Chavez, female   DOB: November 16, 1945, 70 y.o.   MRN: [id number]  HPI  70yo F with hiv disease, HTn, Breast Ca,hx fo CVA 2016,  paranoia. CD 4 count of 500/VL<20(nov 2016)  on descovy/DRVr.   Her stats of breast ca is stage IIB dx in June 2015 with invasive ductal ca s/p mastectomy and port-a-cath palcement. [id number] on final path. She declined adjuvant chemotherapy but she has follow up with Dr Zola Button. SHe had repeat CT scan mid Sept 2017 that did not show any recurrent disease with plan for repeat mammo.continue to flush port every 8 wks.  She reports missing a few doses, being forgetful. She states that she called family services looking for psychologist but no one has called.   Outpatient Encounter Prescriptions as of 08/22/2016  Medication Sig  . aspirin EC 325 MG tablet Take 1 tablet (325 mg total) by mouth daily.  Marland Kitchen atenolol (TENORMIN) 50 MG tablet TAKE 1 TABLET BY MOUTH EVERY DAY  . atorvastatin (LIPITOR) 20 MG tablet   . Darunavir Ethanolate (PREZISTA) 800 MG tablet Take 1 tablet (800 mg total) by mouth daily.  Marland Kitchen emtricitabine-tenofovir AF (DESCOVY) 200-25 MG tablet Take 1 tablet by mouth daily.  Marland Kitchen levothyroxine (SYNTHROID, LEVOTHROID) 75 MCG tablet   . lidocaine-prilocaine (EMLA) cream APPLY 1 APPLICATION TOPICALLY AS NEEDED.  Marland Kitchen NORVIR 100 MG TABS tablet TAKE 1 TABLET BY MOUTH DAILY  . Vitamin D, Ergocalciferol, (DRISDOL) 50000 units CAPS capsule    Facility-Administered Encounter Medications as of 08/22/2016  Medication  . baclofen (LIORESAL) tablet 5 mg     Patient Active Problem List   Diagnosis Date Noted  . Port catheter in place 06/28/2016  . Spastic hemiplegia affecting nondominant side ([place]) 11/29/2015  . 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 Acute CVA (cerebrovascular accident) ([place]) 09/14/2015  . CVA (cerebral infarction) 09/13/2015  . Hyperlipidemia 05/31/2014  . Pedal edema 05/11/2014  . Paranoia ([place])  05/11/2014  . Breast cancer of upper-inner quadrant of right female breast ([place]) 04/15/2014  . History of pituitary tumor 01/28/2014  . Avascular necrosis of bones of both hips ([place]) 01/28/2014  . Genital herpes 01/28/2014  . Ovarian cyst, bilateral 01/26/2014  . Essential hypertension, benign 01/26/2014  . Unspecified vitamin D deficiency 01/26/2014  . DJD (degenerative joint disease) 01/26/2014  . HIV disease ([place]) 01/07/2014     Health Maintenance Due  Topic Date Due  . TETANUS/TDAP  10/21/1965  . COLONOSCOPY  10/21/1996  . ZOSTAVAX  10/21/2006  . DEXA SCAN  10/22/2011  . PNA vac Low Risk Adult (2 of 2 - PCV13) 01/23/2015  . MAMMOGRAM  04/09/2016  . INFLUENZA VACCINE  05/16/2016     Review of Systems Per hpi, otherwise 1 point ros is negative Physical Exam   BP (!) 156/85   Pulse 74   Temp 97.7 F (36.5 C) (Oral)   Wt 183 lb (83 kg)   BMI 31.41 kg/m  Physical Exam  Constitutional:  oriented to person, place, and time. appears well-developed and well-nourished. No distress.  HENT: [place]/AT, PERRLA, no scleral icterus Mouth/Throat: Oropharynx is clear and moist. No oropharyngeal exudate.  Cardiovascular: Normal rate, regular rhythm and normal heart sounds. Exam reveals no gallop and no friction rub.  No murmur heard.  Pulmonary/Chest: Effort normal and breath sounds normal. No respiratory distress.  has no wheezes.  Neck = supple, no nuchal rigidity Abdominal: Soft. Bowel sounds are normal.  exhibits no  distension. There is no tenderness.  Lymphadenopathy: no cervical adenopathy. No axillary adenopathy Neurological: alert and oriented to person, place, and time.  Skin: Skin is warm and dry. No rash noted. No erythema.  Psychiatric: a normal mood and affect.  behavior is normal.   Lab Results  Component Value Date   CD4TCELL 26 (L) 08/17/2015   Lab Results  Component Value Date   CD4TABS 500 08/17/2015   CD4TABS 490 03/18/2015   CD4TABS 560 10/20/2014   Lab  Results  Component Value Date   HIV1RNAQUANT <20 08/17/2015   Lab Results  Component Value Date   HEPBSAB NEG 01/22/2014   No results found for: RPR  CBC Lab Results  Component Value Date   WBC 5.0 06/28/2016   RBC 4.14 06/28/2016   HGB 12.4 06/28/2016   HCT 38.9 06/28/2016   PLT 218 06/28/2016   MCV 94.0 06/28/2016   MCH 30.0 06/28/2016   MCHC 31.9 06/28/2016   RDW 13.8 06/28/2016   LYMPHSABS 1.8 06/28/2016   MONOABS 0.4 06/28/2016   EOSABS 0.1 06/28/2016   BASOSABS 0.0 06/28/2016   BMET Lab Results  Component Value Date   NA 143 06/28/2016   K 4.3 06/28/2016   CL 107 09/13/2015   CO2 25 06/28/2016   GLUCOSE 86 06/28/2016   BUN 13.7 06/28/2016   CREATININE 0.9 06/28/2016   CALCIUM 9.2 06/28/2016   GFRNONAA >60 09/13/2015   GFRAA >60 09/13/2015     Assessment and Plan   hiv disease = has not started descovy yet to replace truvada  Get labs at next portacath flush today  Deferred flu vaccine  Memory disorder = she declined doing MMSE. She also didn't want [place] to refer to neurology. She does see dr Leonie Man for stroke management, for which I will reach out to him

## 2016-08-23 ENCOUNTER — Ambulatory Visit (HOSPITAL_BASED_OUTPATIENT_CLINIC_OR_DEPARTMENT_OTHER): Payer: Medicare Other

## 2016-08-23 DIAGNOSIS — Z853 Personal history of malignant neoplasm of breast: Secondary | ICD-10-CM

## 2016-08-23 DIAGNOSIS — Z452 Encounter for adjustment and management of vascular access device: Secondary | ICD-10-CM | POA: Diagnosis present

## 2016-08-23 DIAGNOSIS — C50211 Malignant neoplasm of upper-inner quadrant of right female breast: Secondary | ICD-10-CM

## 2016-08-23 DIAGNOSIS — Z95828 Presence of other vascular implants and grafts: Secondary | ICD-10-CM

## 2016-08-23 MED ORDER — SODIUM CHLORIDE 0.9 % IJ SOLN
10.0000 mL | INTRAMUSCULAR | Status: DC | PRN
  Administered 2016-08-23: 10 mL via INTRAVENOUS
  Filled 2016-08-23: qty 10

## 2016-08-23 MED ORDER — HEPARIN SOD (PORK) LOCK FLUSH 100 UNIT/ML IV SOLN
500.0000 [IU] | Freq: Once | INTRAVENOUS | Status: AC | PRN
  Administered 2016-08-23: 500 [IU] via INTRAVENOUS
  Filled 2016-08-23: qty 5

## 2016-09-15 ENCOUNTER — Encounter: Payer: Self-pay | Admitting: Nurse Practitioner

## 2016-10-18 ENCOUNTER — Ambulatory Visit (HOSPITAL_BASED_OUTPATIENT_CLINIC_OR_DEPARTMENT_OTHER): Payer: Medicare Other

## 2016-10-18 VITALS — BP 150/69 | HR 60 | Temp 98.0°F | Resp 18

## 2016-10-18 DIAGNOSIS — C50211 Malignant neoplasm of upper-inner quadrant of right female breast: Secondary | ICD-10-CM

## 2016-10-18 DIAGNOSIS — Z452 Encounter for adjustment and management of vascular access device: Secondary | ICD-10-CM | POA: Diagnosis present

## 2016-10-18 DIAGNOSIS — Z853 Personal history of malignant neoplasm of breast: Secondary | ICD-10-CM | POA: Diagnosis not present

## 2016-10-18 DIAGNOSIS — Z95828 Presence of other vascular implants and grafts: Secondary | ICD-10-CM

## 2016-10-18 MED ORDER — HEPARIN SOD (PORK) LOCK FLUSH 100 UNIT/ML IV SOLN
500.0000 [IU] | Freq: Once | INTRAVENOUS | Status: AC | PRN
  Administered 2016-10-18: 500 [IU] via INTRAVENOUS
  Filled 2016-10-18: qty 5

## 2016-10-18 MED ORDER — SODIUM CHLORIDE 0.9 % IJ SOLN
10.0000 mL | INTRAMUSCULAR | Status: DC | PRN
  Administered 2016-10-18: 10 mL via INTRAVENOUS
  Filled 2016-10-18: qty 10

## 2016-10-21 ENCOUNTER — Other Ambulatory Visit: Payer: Self-pay | Admitting: Internal Medicine

## 2016-10-21 DIAGNOSIS — I10 Essential (primary) hypertension: Secondary | ICD-10-CM

## 2016-11-23 ENCOUNTER — Ambulatory Visit (INDEPENDENT_AMBULATORY_CARE_PROVIDER_SITE_OTHER): Payer: Medicare Other | Admitting: Internal Medicine

## 2016-11-23 ENCOUNTER — Encounter: Payer: Self-pay | Admitting: Internal Medicine

## 2016-11-23 VITALS — BP 145/80 | HR 65 | Temp 98.2°F | Ht 64.0 in | Wt 183.0 lb

## 2016-11-23 DIAGNOSIS — C50011 Malignant neoplasm of nipple and areola, right female breast: Secondary | ICD-10-CM

## 2016-11-23 DIAGNOSIS — B20 Human immunodeficiency virus [HIV] disease: Secondary | ICD-10-CM

## 2016-11-23 DIAGNOSIS — I10 Essential (primary) hypertension: Secondary | ICD-10-CM | POA: Diagnosis not present

## 2016-11-23 NOTE — Progress Notes (Signed)
RFV: hiv disease follow up  Patient ID: Kelly Chavez, female   DOB: [birth year], 71 y.o.   MRN: [id number]  HPI 71yo F with hiv disease, paranoia, breast ca s/p mastectomy deferred chemo. CD 4 count of 500/VL<20 in November 2017, currently on truvada/DRVr. She has not switched to descovy yet. Still not ready to change regimen.She had portacath flushed in early January without difficulty  Outpatient Encounter Prescriptions as of 11/23/2016  Medication Sig  . aspirin EC 325 MG tablet Take 1 tablet (325 mg total) by mouth daily.  Marland Kitchen atenolol (TENORMIN) 50 MG tablet TAKE 1 TABLET BY MOUTH EVERY DAY  . atorvastatin (LIPITOR) 20 MG tablet   . Darunavir Ethanolate (PREZISTA) 800 MG tablet Take 1 tablet (800 mg total) by mouth daily.  Marland Kitchen emtricitabine-tenofovir AF (DESCOVY) 200-25 MG tablet Take 1 tablet by mouth daily.  Marland Kitchen levothyroxine (SYNTHROID, LEVOTHROID) 75 MCG tablet   . lidocaine-prilocaine (EMLA) cream APPLY 1 APPLICATION TOPICALLY AS NEEDED.  Marland Kitchen NORVIR 100 MG TABS tablet TAKE 1 TABLET BY MOUTH DAILY  . Vitamin D, Ergocalciferol, (DRISDOL) 50000 units CAPS capsule    Facility-Administered Encounter Medications as of 11/23/2016  Medication  . baclofen (LIORESAL) tablet 5 mg     Patient Active Problem List   Diagnosis Date Noted  . Port catheter in place 06/28/2016  . Spastic hemiplegia affecting nondominant side ([place]) 11/29/2015  . 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 Acute CVA (cerebrovascular accident) ([place]) 09/14/2015  . CVA (cerebral infarction) 09/13/2015  . Hyperlipidemia 05/31/2014  . Pedal edema 05/11/2014  . Paranoia ([place]) 05/11/2014  . Breast cancer of upper-inner quadrant of right female breast ([address]) 04/15/2014  . History of pituitary tumor 01/28/2014  . Avascular necrosis of bones of both hips ([place]) 01/28/2014  . Genital herpes 01/28/2014  . Ovarian cyst, bilateral 01/26/2014  . Essential hypertension, benign 01/26/2014  . Unspecified  vitamin D deficiency 01/26/2014  . DJD (degenerative joint disease) 01/26/2014  . HIV disease ([place]) 01/07/2014     Health Maintenance Due  Topic Date Due  . TETANUS/TDAP  10/21/1965  . COLONOSCOPY  10/21/1996  . ZOSTAVAX  10/21/2006  . DEXA SCAN  10/22/2011  . PNA vac Low Risk Adult (2 of 2 - PCV13) 01/23/2015  . MAMMOGRAM  04/09/2016  . INFLUENZA VACCINE  05/16/2016     Review of Systems Review of Systems  Constitutional: Negative for fever, chills, diaphoresis, activity change, appetite change, fatigue and unexpected weight change.  HENT: Negative for congestion, sore throat, rhinorrhea, sneezing, trouble swallowing and sinus pressure.  Eyes: Negative for photophobia and visual disturbance.  Respiratory: Negative for cough, chest tightness, shortness of breath, wheezing and stridor.  Cardiovascular: Negative for chest pain, palpitations and leg swelling.  Gastrointestinal: Negative for nausea, vomiting, abdominal pain, diarrhea, constipation, blood in stool, abdominal distention and anal bleeding.  Genitourinary: Negative for dysuria, hematuria, flank pain and difficulty urinating.  Musculoskeletal: Negative for myalgias, back pain, joint swelling, arthralgias and gait problem.  Skin: Negative for color change, pallor, rash and wound.  Neurological: Negative for dizziness, tremors, weakness and light-headedness.  Hematological: Negative for adenopathy. Does not bruise/bleed easily.  Psychiatric/Behavioral: Negative for behavioral problems, confusion, sleep disturbance, dysphoric mood, decreased concentration and agitation.    Physical Exam  BP (!) 145/80   Pulse 65   Temp 98.2 F (36.8 C) (Oral)   Ht 5\' 4"  (1.626 m)   Wt 183 lb (83 kg)   BMI 31.41 kg/m  Physical  Exam  Constitutional:  oriented to person, place, and time. appears well-developed and well-nourished. No distress.  HENT: [place]/AT, PERRLA, no scleral icterus Mouth/Throat: Oropharynx is clear and moist. No  oropharyngeal exudate.  Cardiovascular: Normal rate, regular rhythm and normal heart sounds. Exam reveals no gallop and no friction rub.  No murmur heard.  Pulmonary/Chest: Effort normal and breath sounds normal. No respiratory distress.  has no wheezes.  Neck = supple, no nuchal rigidity Abdominal: Soft. Bowel sounds are normal.  exhibits no distension. There is no tenderness.  Lymphadenopathy: no cervical adenopathy. No axillary adenopathy Neurological: alert and oriented to person, place, and time.  Skin: Skin is warm and dry. No rash noted. No erythema.  Psychiatric: a normal mood and affect.  behavior is normal.   Lab Results  Component Value Date   CD4TCELL 26 (L) 08/17/2015   Lab Results  Component Value Date   CD4TABS 500 08/17/2015   CD4TABS 490 03/18/2015   CD4TABS 560 10/20/2014   Lab Results  Component Value Date   HIV1RNAQUANT <20 08/17/2015   Lab Results  Component Value Date   HEPBSAB NEG 01/22/2014   Lab Results  Component Value Date   LABRPR NON REAC 08/17/2015    CBC Lab Results  Component Value Date   WBC 5.0 06/28/2016   RBC 4.14 06/28/2016   HGB 12.4 06/28/2016   HCT 38.9 06/28/2016   PLT 218 06/28/2016   MCV 94.0 06/28/2016   MCH 30.0 06/28/2016   MCHC 31.9 06/28/2016   RDW 13.8 06/28/2016   LYMPHSABS 1.8 06/28/2016   MONOABS 0.4 06/28/2016   EOSABS 0.1 06/28/2016    BMET Lab Results  Component Value Date   NA 143 06/28/2016   K 4.3 06/28/2016   CL 107 09/13/2015   CO2 25 06/28/2016   GLUCOSE 86 06/28/2016   BUN 13.7 06/28/2016   CREATININE 0.9 06/28/2016   CALCIUM 9.2 06/28/2016   GFRNONAA >60 09/13/2015   GFRAA >60 09/13/2015    Assessment and Plan  hiv disease= has well controlled hiv disease. Will plan on continue her current regimen. Have her labs check in 3 months at next portacath flush. She is not ready to change her regimen to descovy/prezcobix  Breast cancer= she has upcoming imaging for surveillance with  oncology  Health maintenance = uptodate  Essential hypertension = no change in meds needed yet. Slightly above goal but has hisotry of white coate syndrome

## 2016-11-23 NOTE — Patient Instructions (Signed)
Have the cancer center do lab work when they flush your port before our next appointment

## 2016-12-01 ENCOUNTER — Ambulatory Visit: Payer: Medicare Other | Admitting: Nurse Practitioner

## 2016-12-15 ENCOUNTER — Other Ambulatory Visit: Payer: Self-pay | Admitting: Internal Medicine

## 2016-12-15 DIAGNOSIS — B20 Human immunodeficiency virus [HIV] disease: Secondary | ICD-10-CM

## 2016-12-15 MED ORDER — DARUNAVIR ETHANOLATE 800 MG PO TABS: 800.0000 mg | ORAL_TABLET | Freq: Every day | ORAL | 1 refills | Status: DC

## 2016-12-18 ENCOUNTER — Other Ambulatory Visit: Payer: Self-pay | Admitting: Internal Medicine

## 2016-12-18 ENCOUNTER — Telehealth: Payer: Self-pay | Admitting: *Deleted

## 2016-12-18 DIAGNOSIS — B20 Human immunodeficiency virus [HIV] disease: Secondary | ICD-10-CM

## 2016-12-18 NOTE — Telephone Encounter (Signed)
Patient requesting Truvada. Per last office note, she is not ready to switch to new regimen, Dr Baxter Flattery stated it was ok to keep on her old regimen of Truvada, Prezista and Norvir.  RN approved Truvada refill, included note to Pharmacy that [facility] not be filled at the same time. Patient will need her medication list updated with appropriate regimen.  Please advise which one she should be on. Landis Gandy, RN

## 2016-12-22 ENCOUNTER — Other Ambulatory Visit: Payer: Self-pay | Admitting: Surgery

## 2016-12-22 DIAGNOSIS — Z1231 Encounter for screening mammogram for malignant neoplasm of breast: Secondary | ICD-10-CM

## 2016-12-28 ENCOUNTER — Other Ambulatory Visit: Payer: Medicare Other

## 2016-12-28 ENCOUNTER — Telehealth: Payer: Self-pay | Admitting: Oncology

## 2016-12-28 ENCOUNTER — Telehealth: Payer: Self-pay | Admitting: *Deleted

## 2016-12-28 ENCOUNTER — Ambulatory Visit: Payer: Medicare Other | Admitting: Oncology

## 2016-12-28 NOTE — Telephone Encounter (Signed)
Patient left vm saying she was sorry she could not make it to her appt today and would like to re-schedule. Note to schedulers for  1st available. Lab.flush/dr shadad. Notified patient that schedulers would be calling.

## 2016-12-28 NOTE — Telephone Encounter (Signed)
Spoke with patient re appointments for 4/10

## 2017-01-15 ENCOUNTER — Ambulatory Visit
Admission: RE | Admit: 2017-01-15 | Discharge: 2017-01-15 | Disposition: A | Discharge status: Home / Self Care | Payer: Medicare Other | Source: Ambulatory Visit | Attending: Surgery | Admitting: Surgery

## 2017-01-15 DIAGNOSIS — Z1231 Encounter for screening mammogram for malignant neoplasm of breast: Secondary | ICD-10-CM

## 2017-01-23 ENCOUNTER — Telehealth: Payer: Self-pay | Admitting: Oncology

## 2017-01-23 ENCOUNTER — Other Ambulatory Visit (HOSPITAL_BASED_OUTPATIENT_CLINIC_OR_DEPARTMENT_OTHER): Payer: Medicare Other

## 2017-01-23 ENCOUNTER — Ambulatory Visit (HOSPITAL_BASED_OUTPATIENT_CLINIC_OR_DEPARTMENT_OTHER): Payer: Medicare Other

## 2017-01-23 ENCOUNTER — Ambulatory Visit (HOSPITAL_BASED_OUTPATIENT_CLINIC_OR_DEPARTMENT_OTHER): Payer: Medicare Other | Admitting: Oncology

## 2017-01-23 VITALS — BP 161/62 | HR 59 | Temp 98.7°F | Resp 18 | Ht 64.0 in | Wt 171.7 lb

## 2017-01-23 DIAGNOSIS — Z853 Personal history of malignant neoplasm of breast: Secondary | ICD-10-CM | POA: Diagnosis present

## 2017-01-23 DIAGNOSIS — B20 Human immunodeficiency virus [HIV] disease: Secondary | ICD-10-CM

## 2017-01-23 DIAGNOSIS — C50211 Malignant neoplasm of upper-inner quadrant of right female breast: Secondary | ICD-10-CM

## 2017-01-23 DIAGNOSIS — Z95828 Presence of other vascular implants and grafts: Secondary | ICD-10-CM

## 2017-01-23 LAB — CBC WITH DIFFERENTIAL/PLATELET
BASO%: 0.9 % (ref 0.0–2.0)
BASOS ABS: 0.1 10*3/uL (ref 0.0–0.1)
EOS%: 0.6 % (ref 0.0–7.0)
Eosinophils Absolute: 0.1 10*3/uL (ref 0.0–0.5)
HCT: 38.1 % (ref 34.8–46.6)
HGB: 12.4 g/dL (ref 11.6–15.9)
LYMPH%: 25.7 % (ref 14.0–49.7)
MCH: 32 pg (ref 25.1–34.0)
MCHC: 32.4 g/dL (ref 31.5–36.0)
MCV: 98.5 fL (ref 79.5–101.0)
MONO#: 0.5 10*3/uL (ref 0.1–0.9)
MONO%: 6.5 % (ref 0.0–14.0)
NEUT#: 5.2 10*3/uL (ref 1.5–6.5)
NEUT%: 66.3 % (ref 38.4–76.8)
Platelets: 247 10*3/uL (ref 145–400)
RBC: 3.87 10*6/uL (ref 3.70–5.45)
RDW: 13.6 % (ref 11.2–14.5)
WBC: 7.9 10*3/uL (ref 3.9–10.3)
lymph#: 2 10*3/uL (ref 0.9–3.3)

## 2017-01-23 LAB — COMPREHENSIVE METABOLIC PANEL
ALT: 25 U/L (ref 0–55)
AST: 20 U/L (ref 5–34)
Albumin: 3.4 g/dL — ABNORMAL LOW (ref 3.5–5.0)
Alkaline Phosphatase: 110 U/L (ref 40–150)
Anion Gap: 10 mEq/L (ref 3–11)
BUN: 19.4 mg/dL (ref 7.0–26.0)
CO2: 25 meq/L (ref 22–29)
Calcium: 9.7 mg/dL (ref 8.4–10.4)
Chloride: 107 mEq/L (ref 98–109)
Creatinine: 0.9 mg/dL (ref 0.6–1.1)
EGFR: 74 mL/min/{1.73_m2} — AB (ref >90)
Glucose: 92 mg/dl (ref 70–140)
POTASSIUM: 4.2 meq/L (ref 3.5–5.1)
SODIUM: 142 meq/L (ref 136–145)
Total Bilirubin: 0.24 mg/dL (ref 0.20–1.20)
Total Protein: 7.4 g/dL (ref 6.4–8.3)

## 2017-01-23 MED ORDER — SODIUM CHLORIDE 0.9 % IJ SOLN
10.0000 mL | INTRAMUSCULAR | Status: DC | PRN
  Administered 2017-01-23: 10 mL via INTRAVENOUS
  Filled 2017-01-23: qty 10

## 2017-01-23 MED ORDER — HEPARIN SOD (PORK) LOCK FLUSH 100 UNIT/ML IV SOLN
500.0000 [IU] | Freq: Once | INTRAVENOUS | Status: AC | PRN
  Administered 2017-01-23: 500 [IU] via INTRAVENOUS
  Filled 2017-01-23: qty 5

## 2017-01-23 NOTE — Progress Notes (Signed)
Hematology and Oncology Follow Up Visit  Kelly Chavez [id number] 1946/08/17 71 y.o. 01/23/2017 1:11 PM Kelly Chavez., MDShelton, [name], MD   Principle Diagnosis: 71 year old woman with stage IIB (T2 N1) right-sided breast cancer. 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   Prior Therapy:  She is status post biopsy on 04/09/2014 which confirmed the presence of invasive carcinoma with axillary lymph node involvement. She is status post right modified radical mastectomy and insertion of a Port-A-Cath done on 06/15/2014. Her final pathology revealed T4bN1a. Her tumor showed lymphovascular invasion with involvement of the nipple but not the nipple and dermal lymphatics. One out of 16 lymph nodes involved.  Current therapy: Observation and surveillance. She declined adjuvant therapy.  Interim History:  Kelly Chavez presents today for a followup visit. Since her last visit, she reports reasonably well without any complaints. She continues to drive and attends to activities of daily living although her mobility is slow or than before. Her appetite remains reasonable without any weight loss. She does not report any breast masses or lesions. She denied any early satiety or change in her bowel habits.    She has not reported any headaches or blurry vision or syncope. Not reporting any chest pain shortness of breath cough or hemoptysis. She she has not reported any nausea or vomiting or abdominal pain. Has not reported any frequency urgency or hesitancy. Has not reported any skeletal complaints. Has not reported any arthralgias or myalgias. Rest of the review of systems unremarkable.    Medications: I have reviewed the patient's current medications.  Current Outpatient Prescriptions  Medication Sig Dispense Refill  . aspirin EC 325 MG tablet Take 1 tablet (325 mg total) by mouth daily. 30 tablet 0  .  atenolol (TENORMIN) 50 MG tablet TAKE 1 TABLET BY MOUTH EVERY DAY 90 tablet 1  . atorvastatin (LIPITOR) 20 MG tablet     . Darunavir Ethanolate (PREZISTA) 800 MG tablet Take 1 tablet (800 mg total) by mouth daily. 90 tablet 1  . emtricitabine-tenofovir AF (DESCOVY) 200-25 MG tablet Take 1 tablet by mouth daily. 30 tablet 11  . levothyroxine (SYNTHROID, LEVOTHROID) 100 MCG tablet     . lidocaine-prilocaine (EMLA) cream APPLY 1 APPLICATION TOPICALLY AS NEEDED. 30 g 0  . NORVIR 100 MG TABS tablet TAKE 1 TABLET BY MOUTH DAILY 90 tablet 1  . TRUVADA 200-300 MG tablet TAKE 1 TABLET BY MOUTH DAILY. 90 tablet 2  . Vitamin D, Ergocalciferol, (DRISDOL) 50000 units CAPS capsule      Current Facility-Administered Medications  Medication Dose Route Frequency Provider Last Rate Last Dose  . baclofen (LIORESAL) tablet 5 mg  5 mg Oral BID Kelly Fila, MD         Allergies: No Known Allergies  Past Medical History, Surgical history, Social history, and Family History were reviewed and updated.  Physical Exam: Blood pressure (!) 161/62, pulse (!) 59, temperature 98.7 F (37.1 C), temperature source Oral, resp. rate 18, height _0  (1.626 m), weight 171 lb 11.2 oz (77.9 kg), SpO2 100 %. ECOG: 1 General appearance: Well-appearing woman without distress. Head: Normocephalic, without obvious abnormality no oral  ulcers or lesions. Neck: no adenopathy Lymph nodes: Cervical, supraclavicular, and axillary nodes normal. Heart:regular rate and rhythm, S1, S2 normal, no murmur, click, rub or gallop Chest wall examination:  Port-A-Cath in place. Site appeared nontender without erythema. Lung: Clear without wheezes, rhonchi or  dullness to percussion. Abdomin: soft, non-tender, without masses or organomegaly no shifting dullness or ascites. EXT:no erythema, induration, or nodules. Slight arm edema noted. Skin showed no rashes or lesions.  Lab Results: Lab Results  Component Value Date   WBC 7.9 01/23/2017    HGB 12.4 01/23/2017   HCT 38.1 01/23/2017   MCV 98.5 01/23/2017   PLT 247 01/23/2017     Chemistry      Component Value Date/Time   NA 142 01/23/2017 1214   K 4.2 01/23/2017 1214   CL 107 09/13/2015 1345   CO2 25 01/23/2017 1214   BUN 19.4 01/23/2017 1214   CREATININE 0.9 01/23/2017 1214      Component Value Date/Time   CALCIUM 9.7 01/23/2017 1214   ALKPHOS 110 01/23/2017 1214   AST 20 01/23/2017 1214   ALT 25 01/23/2017 1214   BILITOT 0.24 01/23/2017 1214      1. Postsurgical change in the RIGHT axilla without evidence of local recurrence. 2. Stable small RIGHT supraclavicular lymph node which was not hypermetabolic on comparison PET-CT scan. 3. No evidence of breast cancer recurrence in the thorax.  Abdomen / Pelvis Impression:  1. No evidence of metastatic disease in the abdomen or pelvis. 2. Benign-appearing RIGHT renal lesion.   Impression and Plan:  71 year old woman with the following issues:   1. Invasive ductal carcinoma of the right breast presented with a 4 cm mass as well as palpable adenopathy. She is status post mastectomy with her tumor found to be T4b N1a disease with nipple involvement on 06/15/2014. She declined adjuvant chemotherapy on multiple occasions.  CT scan on 06/29/2016 showed no evidence to suggest recurrent disease at this time and she continues to decline adjuvant therapy.   The plan is to continue with active surveillance and repeat imaging studies in 6 months. She does have high risk of cancer recurrence given her triple negative large tumor. She develops recurrent disease, we will discuss the role of systemic therapy at that time.    2. IV access: Port-A-Cath inserted and will be utilized for systemic chemotherapy. This will be flushed every 8 weeks.   3. HIV: Followed by Dr. Baxter Flattery and appears to be reasonably stable.  4. Mammography surveillance: She is up-to-date at this time her last mammogram was a week ago.  5.  Follow-up: Will be in 6 months after a repeat CT scan.  Zola Button, MD 4/10/20181:11 PM

## 2017-01-23 NOTE — Telephone Encounter (Signed)
Gave patient avs report and appointments for June - July - October. Central radiology will call re scan.

## 2017-02-20 ENCOUNTER — Encounter: Payer: Self-pay | Admitting: Internal Medicine

## 2017-02-20 ENCOUNTER — Ambulatory Visit (INDEPENDENT_AMBULATORY_CARE_PROVIDER_SITE_OTHER): Payer: Medicare Other | Admitting: Internal Medicine

## 2017-02-20 VITALS — BP 150/80 | HR 58 | Temp 97.6°F | Wt 168.0 lb

## 2017-02-20 DIAGNOSIS — B20 Human immunodeficiency virus [HIV] disease: Secondary | ICD-10-CM

## 2017-02-20 DIAGNOSIS — E038 Other specified hypothyroidism: Secondary | ICD-10-CM | POA: Diagnosis not present

## 2017-02-20 DIAGNOSIS — I639 Cerebral infarction, unspecified: Secondary | ICD-10-CM

## 2017-02-20 DIAGNOSIS — I6350 Cerebral infarction due to unspecified occlusion or stenosis of unspecified cerebral artery: Secondary | ICD-10-CM

## 2017-02-20 NOTE — Progress Notes (Signed)
RFV: hiv disease  Patient ID: Kelly Chavez, female   DOB: [birth year], 71 y.o.   MRN: [id number]  HPI Kelly Chavez is a 71yo F with HIV disease, Cd 4 cont 500/VL<20, hx of breast cancer s/p mastectomy - still has retained port for routine bloodwork. She reports doing well but still is concerned that she is taking too many meds. Went to see dr Rodena Piety from oncology. To return in 6 months for re-imaging.  She has stopped taking synthroid since she thought it was interacting with her hiv disease. She has stopped taking her aspirin since pharmacist made her question the dose. She is also not taking artovastatin. She has not replaced truvada for descovy  Soc hx: no smoking Outpatient Encounter Prescriptions as of 02/20/2017  Medication Sig  . aspirin EC 325 MG tablet Take 1 tablet (325 mg total) by mouth daily.  Marland Kitchen atenolol (TENORMIN) 50 MG tablet TAKE 1 TABLET BY MOUTH EVERY DAY  . atorvastatin (LIPITOR) 20 MG tablet   . Darunavir Ethanolate (PREZISTA) 800 MG tablet Take 1 tablet (800 mg total) by mouth daily.  Marland Kitchen emtricitabine-tenofovir AF (DESCOVY) 200-25 MG tablet Take 1 tablet by mouth daily.  Marland Kitchen levothyroxine (SYNTHROID, LEVOTHROID) 100 MCG tablet   . lidocaine-prilocaine (EMLA) cream APPLY 1 APPLICATION TOPICALLY AS NEEDED.  Marland Kitchen NORVIR 100 MG TABS tablet TAKE 1 TABLET BY MOUTH DAILY  . TRUVADA 200-300 MG tablet TAKE 1 TABLET BY MOUTH DAILY.  Marland Kitchen Vitamin D, Ergocalciferol, (DRISDOL) 50000 units CAPS capsule    Facility-Administered Encounter Medications as of 02/20/2017  Medication  . baclofen (LIORESAL) tablet 5 mg     Patient Active Problem List   Diagnosis Date Noted  . Port catheter in place 06/28/2016  . Spastic hemiplegia affecting nondominant side ([place]) 11/29/2015  . Cerebral infarction due to thrombosis of basilar artery ([place])   . Acute CVA (cerebrovascular accident) ([place]) 09/14/2015  . CVA (cerebral infarction) 09/13/2015  . Hyperlipidemia 05/31/2014  . Pedal edema 05/11/2014  .  Paranoia ([place]) 05/11/2014  . Breast cancer of upper-inner quadrant of right female breast ([place]) 04/15/2014  . History of pituitary tumor 01/28/2014  . Avascular necrosis of bones of both hips ([place]) 01/28/2014  . Genital herpes 01/28/2014  . Ovarian cyst, bilateral 01/26/2014  . Essential hypertension, benign 01/26/2014  . Unspecified vitamin D deficiency 01/26/2014  . DJD (degenerative joint disease) 01/26/2014  . HIV disease ([place]) 01/07/2014     Health Maintenance Due  Topic Date Due  . TETANUS/TDAP  10/21/1965  . COLONOSCOPY  10/21/1996  . DEXA SCAN  10/22/2011  . PNA vac Low Risk Adult (2 of 2 - PCV13) 01/23/2015     Review of Systems  Physical Exam   BP (!) 150/80   Pulse (!) 58   Temp 97.6 F (36.4 C) (Oral)   Wt 168 lb (76.2 kg)   SpO2 100%   BMI 28.84 kg/m  Physical Exam  Constitutional:  oriented to person, place, and time. appears well-developed and well-nourished. No distress.  HENT: [place]/AT, PERRLA, no scleral icterus Mouth/Throat: Oropharynx is clear and moist. No oropharyngeal exudate.  Cardiovascular: Normal rate, regular rhythm and normal heart sounds. Exam reveals no gallop and no friction rub.  No murmur heard.  Pulmonary/Chest: Effort normal and breath sounds normal. No respiratory distress.  has no wheezes.  Chest wall: no tenderness to chest wal Neck = supple, no nuchal rigidity Abdominal: Soft. Bowel sounds are normal.  exhibits no distension. There is no tenderness.  Skin: Skin is  warm and dry. No rash noted. No erythema.  Psychiatric: a normal mood and affect.  behavior is normal.    Lab Results  Component Value Date   CD4TCELL 26 (L) 08/17/2015   Lab Results  Component Value Date   CD4TABS 500 08/17/2015   CD4TABS 490 03/18/2015   CD4TABS 560 10/20/2014   Lab Results  Component Value Date   HIV1RNAQUANT <20 08/17/2015   Lab Results  Component Value Date   HEPBSAB NEG 01/22/2014   Lab Results  Component Value Date   LABRPR NON  REAC 08/17/2015    CBC Lab Results  Component Value Date   WBC 7.9 01/23/2017   RBC 3.87 01/23/2017   HGB 12.4 01/23/2017   HCT 38.1 01/23/2017   PLT 247 01/23/2017   MCV 98.5 01/23/2017   MCH 32.0 01/23/2017   MCHC 32.4 01/23/2017   RDW 13.6 01/23/2017   LYMPHSABS 2.0 01/23/2017   MONOABS 0.5 01/23/2017   EOSABS 0.1 01/23/2017    BMET Lab Results  Component Value Date   NA 142 01/23/2017   K 4.2 01/23/2017   CL 107 09/13/2015   CO2 25 01/23/2017   GLUCOSE 92 01/23/2017   BUN 19.4 01/23/2017   CREATININE 0.9 01/23/2017   CALCIUM 9.7 01/23/2017   GFRNONAA >60 09/13/2015   GFRAA >60 09/13/2015      Assessment and Plan  hiv disease = continue on current regimen, well controlled. She is unlikely to stop truvada since she is not wanting to change regimen. Will get labs at her next labs drawn at cancer center  Hypothyroidism = recommend to restart. Will check labs in 4 wk after being back on meds.  Hx of CVA = recommend to resume aspirin

## 2017-02-20 NOTE — Patient Instructions (Signed)
Can get these over the counter  Baby aspirn 81mg  daily  Vitamin d3 1 tablet daily

## 2017-03-20 ENCOUNTER — Ambulatory Visit (HOSPITAL_BASED_OUTPATIENT_CLINIC_OR_DEPARTMENT_OTHER): Payer: Medicare Other

## 2017-03-20 DIAGNOSIS — Z452 Encounter for adjustment and management of vascular access device: Secondary | ICD-10-CM | POA: Diagnosis present

## 2017-03-20 DIAGNOSIS — C50211 Malignant neoplasm of upper-inner quadrant of right female breast: Secondary | ICD-10-CM

## 2017-03-20 DIAGNOSIS — Z853 Personal history of malignant neoplasm of breast: Secondary | ICD-10-CM | POA: Diagnosis not present

## 2017-03-20 DIAGNOSIS — Z95828 Presence of other vascular implants and grafts: Secondary | ICD-10-CM

## 2017-03-20 MED ORDER — SODIUM CHLORIDE 0.9 % IJ SOLN
10.0000 mL | INTRAMUSCULAR | Status: DC | PRN
  Administered 2017-03-20: 10 mL via INTRAVENOUS
  Filled 2017-03-20: qty 10

## 2017-03-20 MED ORDER — HEPARIN SOD (PORK) LOCK FLUSH 100 UNIT/ML IV SOLN
500.0000 [IU] | Freq: Once | INTRAVENOUS | Status: AC | PRN
  Administered 2017-03-20: 500 [IU] via INTRAVENOUS
  Filled 2017-03-20: qty 5

## 2017-05-15 ENCOUNTER — Ambulatory Visit (HOSPITAL_BASED_OUTPATIENT_CLINIC_OR_DEPARTMENT_OTHER): Payer: Medicare Other

## 2017-05-15 DIAGNOSIS — Z853 Personal history of malignant neoplasm of breast: Secondary | ICD-10-CM

## 2017-05-15 DIAGNOSIS — Z452 Encounter for adjustment and management of vascular access device: Secondary | ICD-10-CM | POA: Diagnosis present

## 2017-05-15 DIAGNOSIS — Z95828 Presence of other vascular implants and grafts: Secondary | ICD-10-CM

## 2017-05-15 DIAGNOSIS — C50211 Malignant neoplasm of upper-inner quadrant of right female breast: Secondary | ICD-10-CM

## 2017-05-15 MED ORDER — HEPARIN SOD (PORK) LOCK FLUSH 100 UNIT/ML IV SOLN
500.0000 [IU] | Freq: Once | INTRAVENOUS | Status: AC | PRN
  Administered 2017-05-15: 500 [IU] via INTRAVENOUS
  Filled 2017-05-15: qty 5

## 2017-05-15 MED ORDER — SODIUM CHLORIDE 0.9 % IJ SOLN
10.0000 mL | INTRAMUSCULAR | Status: DC | PRN
  Administered 2017-05-15: 10 mL via INTRAVENOUS
  Filled 2017-05-15: qty 10

## 2017-05-24 ENCOUNTER — Encounter: Payer: Self-pay | Admitting: Internal Medicine

## 2017-05-24 ENCOUNTER — Ambulatory Visit (INDEPENDENT_AMBULATORY_CARE_PROVIDER_SITE_OTHER): Payer: Medicare Other | Admitting: Internal Medicine

## 2017-05-24 VITALS — BP 152/80 | HR 60 | Temp 98.1°F | Ht 64.0 in | Wt 170.0 lb

## 2017-05-24 DIAGNOSIS — I10 Essential (primary) hypertension: Secondary | ICD-10-CM

## 2017-05-24 DIAGNOSIS — B20 Human immunodeficiency virus [HIV] disease: Secondary | ICD-10-CM

## 2017-05-24 DIAGNOSIS — I6350 Cerebral infarction due to unspecified occlusion or stenosis of unspecified cerebral artery: Secondary | ICD-10-CM

## 2017-05-24 DIAGNOSIS — K029 Dental caries, unspecified: Secondary | ICD-10-CM | POA: Diagnosis not present

## 2017-05-24 MED ORDER — BICTEGRAVIR-EMTRICITAB-TENOFOV 50-200-25 MG PO TABS: 1.0000 | ORAL_TABLET | Freq: Every day | ORAL | 11 refills | Status: DC

## 2017-05-24 NOTE — Progress Notes (Signed)
Patient ID: Kelly Chavez, female   DOB: 27-Oct-1945, 71 y.o.   MRN: [id number]  HPI 71yo F with hiv disease, CD 4 count of 500/VL<04 Sep 2016 descovy/DRV-r Invasive ductal carcinoma of the right breast presented with a 4 cm mass as well as palpable adenopathy. She is status post mastectomy with her tumor found to be T4b N1a disease with nipple involvement on 06/15/2014. She declined adjuvant chemotherapy on multiple occasions. She has a port a cath for access that is flushed every 8 wk.  Her hiv labs are supposed to be drawn when she has a flush which has not been done recently  She has history of Paranoia, reticent to change regimen. But at this visit she is willing to change to biktarvy  Outpatient Encounter Prescriptions as of 05/24/2017  Medication Sig  . aspirin EC 325 MG tablet Take 1 tablet (325 mg total) by mouth daily. (Patient not taking: Reported on 02/20/2017)  . atenolol (TENORMIN) 50 MG tablet TAKE 1 TABLET BY MOUTH EVERY DAY  . atorvastatin (LIPITOR) 20 MG tablet   . Darunavir Ethanolate (PREZISTA) 800 MG tablet Take 1 tablet (800 mg total) by mouth daily.  Marland Kitchen emtricitabine-tenofovir AF (DESCOVY) 200-25 MG tablet Take 1 tablet by mouth daily. (Patient not taking: Reported on 02/20/2017)  . levothyroxine (SYNTHROID, LEVOTHROID) 100 MCG tablet   . lidocaine-prilocaine (EMLA) cream APPLY 1 APPLICATION TOPICALLY AS NEEDED.  Marland Kitchen NORVIR 100 MG TABS tablet TAKE 1 TABLET BY MOUTH DAILY  . TRUVADA 200-300 MG tablet TAKE 1 TABLET BY MOUTH DAILY.  Marland Kitchen Vitamin D, Ergocalciferol, (DRISDOL) 50000 units CAPS capsule    Facility-Administered Encounter Medications as of 05/24/2017  Medication  . baclofen (LIORESAL) tablet 5 mg     Patient Active Problem List   Diagnosis Date Noted  . Port catheter in place 06/28/2016  . Spastic hemiplegia affecting nondominant side ([place]) 11/29/2015  . Cerebral infarction due to thrombosis of basilar artery ([place])   . Acute CVA (cerebrovascular accident) ([place])  09/14/2015  . CVA (cerebral infarction) 09/13/2015  . Hyperlipidemia 05/31/2014  . Pedal edema 05/11/2014  . Paranoia ([place]) 05/11/2014  . Breast cancer of upper-inner quadrant of right female breast ([place]) 04/15/2014  . History of pituitary tumor 01/28/2014  . Avascular necrosis of bones of both hips ([place]) 01/28/2014  . Genital herpes 01/28/2014  . Ovarian cyst, bilateral 01/26/2014  . Essential hypertension, benign 01/26/2014  . Unspecified vitamin D deficiency 01/26/2014  . DJD (degenerative joint disease) 01/26/2014  . HIV disease ([place]) 01/07/2014     Health Maintenance Due  Topic Date Due  . TETANUS/TDAP  10/21/1965  . COLONOSCOPY  10/21/1996  . DEXA SCAN  10/22/2011  . PNA vac Low Risk Adult (2 of 2 - PCV13) 01/23/2015  . INFLUENZA VACCINE  05/16/2017     Review of Systems  Physical Exam   BP (!) 152/80   Pulse 60   Temp 98.1 F (36.7 C) (Oral)   Ht 5\' 4"  (1.626 m)   Wt 170 lb (77.1 kg)   BMI 29.18 kg/m  Physical Exam  Constitutional:  oriented to person, place, and time. appears well-developed and well-nourished. No distress.  HENT: [place]/AT, PERRLA, no scleral icterus Mouth/Throat: Oropharynx is clear and moist. No oropharyngeal exudate. Poor dentition Chest wall = port on left side no erythema Cardiovascular: Normal rate, regular rhythm and normal heart sounds. Exam reveals no gallop and no friction rub.  No murmur heard.  Pulmonary/Chest: Effort normal and breath sounds normal. No  respiratory distress.  has no wheezes.  Neck = supple, no nuchal rigidity Abdominal: Soft. Bowel sounds are normal.  exhibits no distension. There is no tenderness.  Lymphadenopathy: no cervical adenopathy. No axillary adenopathy Neurological: alert and oriented to person, place, and time.  Skin: Skin is warm and dry. No rash noted. No erythema.  Psychiatric: a normal mood and affect.  behavior is normal.    Lab Results  Component Value Date   CD4TCELL 26 (L) 08/17/2015   Lab  Results  Component Value Date   CD4TABS 500 08/17/2015   CD4TABS 490 03/18/2015   CD4TABS 560 10/20/2014   Lab Results  Component Value Date   HIV1RNAQUANT <20 08/17/2015   Lab Results  Component Value Date   HEPBSAB NEG 01/22/2014   Lab Results  Component Value Date   LABRPR NON REAC 08/17/2015    CBC Lab Results  Component Value Date   WBC 7.9 01/23/2017   RBC 3.87 01/23/2017   HGB 12.4 01/23/2017   HCT 38.1 01/23/2017   PLT 247 01/23/2017   MCV 98.5 01/23/2017   MCH 32.0 01/23/2017   MCHC 32.4 01/23/2017   RDW 13.6 01/23/2017   LYMPHSABS 2.0 01/23/2017   MONOABS 0.5 01/23/2017   EOSABS 0.1 01/23/2017    BMET Lab Results  Component Value Date   NA 142 01/23/2017   K 4.2 01/23/2017   CL 107 09/13/2015   CO2 25 01/23/2017   GLUCOSE 92 01/23/2017   BUN 19.4 01/23/2017   CREATININE 0.9 01/23/2017   CALCIUM 9.7 01/23/2017   GFRNONAA >60 09/13/2015   GFRAA >60 09/13/2015      Assessment and Plan  Poor dentition = she would like further teeth extraction for decaying teeth  Needs dental referral  hiv disease = no recent labs but would like to only get it done through cancer center Needs labs coordinated through cancer center  Change to biktarvy plan to see her mid oct  Hypertension = still not great control but patient states that she has not taken meds yet. Will not change medicaitons that this visit

## 2017-05-25 ENCOUNTER — Other Ambulatory Visit: Payer: Self-pay | Admitting: Internal Medicine

## 2017-05-25 DIAGNOSIS — I10 Essential (primary) hypertension: Secondary | ICD-10-CM

## 2017-06-01 ENCOUNTER — Other Ambulatory Visit: Payer: Self-pay | Admitting: Internal Medicine

## 2017-06-01 DIAGNOSIS — B20 Human immunodeficiency virus [HIV] disease: Secondary | ICD-10-CM

## 2017-06-07 ENCOUNTER — Other Ambulatory Visit: Payer: Self-pay | Admitting: Pharmacist Clinician (PhC)/ Clinical Pharmacy Specialist

## 2017-06-07 MED ORDER — BICTEGRAVIR-EMTRICITAB-TENOFOV 50-200-25 MG PO TABS: 1.0000 | ORAL_TABLET | Freq: Every day | ORAL | 11 refills | Status: DC

## 2017-06-07 NOTE — Progress Notes (Signed)
Prescription has to be transferred to [facility] [place].

## 2017-07-03 ENCOUNTER — Other Ambulatory Visit: Payer: Medicare Other

## 2017-07-17 ENCOUNTER — Ambulatory Visit: Payer: Medicare Other | Admitting: Internal Medicine

## 2017-07-23 ENCOUNTER — Encounter (HOSPITAL_COMMUNITY): Payer: Self-pay

## 2017-07-23 ENCOUNTER — Ambulatory Visit (HOSPITAL_BASED_OUTPATIENT_CLINIC_OR_DEPARTMENT_OTHER): Payer: Medicare Other

## 2017-07-23 ENCOUNTER — Ambulatory Visit (HOSPITAL_COMMUNITY)
Admission: RE | Admit: 2017-07-23 | Discharge: 2017-07-23 | Disposition: A | Discharge status: Home / Self Care | Payer: Medicare Other | Source: Ambulatory Visit | Attending: Oncology | Admitting: Oncology

## 2017-07-23 ENCOUNTER — Other Ambulatory Visit (HOSPITAL_BASED_OUTPATIENT_CLINIC_OR_DEPARTMENT_OTHER): Payer: Medicare Other

## 2017-07-23 ENCOUNTER — Telehealth: Payer: Self-pay

## 2017-07-23 DIAGNOSIS — Z853 Personal history of malignant neoplasm of breast: Secondary | ICD-10-CM | POA: Diagnosis present

## 2017-07-23 DIAGNOSIS — C50211 Malignant neoplasm of upper-inner quadrant of right female breast: Secondary | ICD-10-CM

## 2017-07-23 DIAGNOSIS — K449 Diaphragmatic hernia without obstruction or gangrene: Secondary | ICD-10-CM | POA: Insufficient documentation

## 2017-07-23 DIAGNOSIS — I700 Atherosclerosis of aorta: Secondary | ICD-10-CM | POA: Diagnosis not present

## 2017-07-23 DIAGNOSIS — I2510 Atherosclerotic heart disease of native coronary artery without angina pectoris: Secondary | ICD-10-CM | POA: Diagnosis not present

## 2017-07-23 DIAGNOSIS — M247 Protrusio acetabuli: Secondary | ICD-10-CM | POA: Insufficient documentation

## 2017-07-23 DIAGNOSIS — Z95828 Presence of other vascular implants and grafts: Secondary | ICD-10-CM

## 2017-07-23 DIAGNOSIS — R918 Other nonspecific abnormal finding of lung field: Secondary | ICD-10-CM | POA: Insufficient documentation

## 2017-07-23 LAB — COMPREHENSIVE METABOLIC PANEL
ALT: 34 U/L (ref 0–55)
ANION GAP: 10 meq/L (ref 3–11)
AST: 31 U/L (ref 5–34)
Albumin: 3.6 g/dL (ref 3.5–5.0)
Alkaline Phosphatase: 124 U/L (ref 40–150)
BILIRUBIN TOTAL: 0.32 mg/dL (ref 0.20–1.20)
BUN: 9.9 mg/dL (ref 7.0–26.0)
CALCIUM: 9.8 mg/dL (ref 8.4–10.4)
CO2: 26 mEq/L (ref 22–29)
CREATININE: 0.9 mg/dL (ref 0.6–1.1)
Chloride: 106 mEq/L (ref 98–109)
EGFR: 77 mL/min/{1.73_m2} — ABNORMAL LOW (ref >90)
Glucose: 84 mg/dl (ref 70–140)
Potassium: 4 mEq/L (ref 3.5–5.1)
Sodium: 141 mEq/L (ref 136–145)
TOTAL PROTEIN: 8 g/dL (ref 6.4–8.3)

## 2017-07-23 LAB — CBC WITH DIFFERENTIAL/PLATELET
BASO%: 0.6 % (ref 0.0–2.0)
BASOS ABS: 0 10*3/uL (ref 0.0–0.1)
EOS ABS: 0 10*3/uL (ref 0.0–0.5)
EOS%: 0.6 % (ref 0.0–7.0)
HCT: 38 % (ref 34.8–46.6)
HGB: 12.4 g/dL (ref 11.6–15.9)
LYMPH%: 28.1 % (ref 14.0–49.7)
MCH: 31.9 pg (ref 25.1–34.0)
MCHC: 32.7 g/dL (ref 31.5–36.0)
MCV: 97.5 fL (ref 79.5–101.0)
MONO#: 0.4 10*3/uL (ref 0.1–0.9)
MONO%: 7.7 % (ref 0.0–14.0)
NEUT#: 3.4 10*3/uL (ref 1.5–6.5)
NEUT%: 63 % (ref 38.4–76.8)
PLATELETS: 211 10*3/uL (ref 145–400)
RBC: 3.9 10*6/uL (ref 3.70–5.45)
RDW: 14 % (ref 11.2–14.5)
WBC: 5.4 10*3/uL (ref 3.9–10.3)
lymph#: 1.5 10*3/uL (ref 0.9–3.3)

## 2017-07-23 MED ORDER — IOPAMIDOL (ISOVUE-300) INJECTION 61%
INTRAVENOUS | Status: AC
  Filled 2017-07-23: qty 100

## 2017-07-23 MED ORDER — HEPARIN SOD (PORK) LOCK FLUSH 100 UNIT/ML IV SOLN
500.0000 [IU] | Freq: Once | INTRAVENOUS | Status: AC
  Administered 2017-07-23: 500 [IU] via INTRAVENOUS

## 2017-07-23 MED ORDER — SODIUM CHLORIDE 0.9 % IJ SOLN
10.0000 mL | INTRAMUSCULAR | Status: DC | PRN
  Administered 2017-07-23: 10 mL via INTRAVENOUS
  Filled 2017-07-23: qty 10

## 2017-07-23 MED ORDER — HEPARIN SOD (PORK) LOCK FLUSH 100 UNIT/ML IV SOLN
INTRAVENOUS | Status: AC
  Filled 2017-07-23: qty 5

## 2017-07-23 MED ORDER — IOPAMIDOL (ISOVUE-300) INJECTION 61%
100.0000 mL | Freq: Once | INTRAVENOUS | Status: AC | PRN
  Administered 2017-07-23: 100 mL via INTRAVENOUS

## 2017-07-23 NOTE — Telephone Encounter (Signed)
Printed patient calender per request by patient

## 2017-07-25 ENCOUNTER — Telehealth: Payer: Self-pay | Admitting: Oncology

## 2017-07-25 ENCOUNTER — Ambulatory Visit (HOSPITAL_BASED_OUTPATIENT_CLINIC_OR_DEPARTMENT_OTHER): Payer: Medicare Other | Admitting: Oncology

## 2017-07-25 VITALS — BP 148/60 | HR 64 | Temp 98.0°F | Resp 16 | Ht 64.0 in | Wt 166.3 lb

## 2017-07-25 DIAGNOSIS — B20 Human immunodeficiency virus [HIV] disease: Secondary | ICD-10-CM | POA: Diagnosis not present

## 2017-07-25 DIAGNOSIS — Z853 Personal history of malignant neoplasm of breast: Secondary | ICD-10-CM | POA: Diagnosis present

## 2017-07-25 DIAGNOSIS — C50211 Malignant neoplasm of upper-inner quadrant of right female breast: Secondary | ICD-10-CM

## 2017-07-25 NOTE — Telephone Encounter (Signed)
Gave avs and calendar for October and November  °

## 2017-07-25 NOTE — Progress Notes (Signed)
Hematology and Oncology Follow Up Visit  Kelly Chavez [id number] 06/19/1946 71 y.o. 07/25/2017 1:49 PM Kelly Chavez, MDShelton, Kelly Millin, MD   Principle Diagnosis: 71 year old woman with stage IIB (T2 N1) right-sided breast cancer. 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   Prior Therapy:  She is status post biopsy on 04/09/2014 which confirmed the presence of invasive carcinoma with axillary lymph node involvement. She is status post right modified radical mastectomy and insertion of a Port-A-Cath done on 06/15/2014. Her final pathology revealed T4bN1a. Her tumor showed lymphovascular invasion with involvement of the nipple but not the nipple and dermal lymphatics. One out of 16 lymph nodes involved.  Current therapy: Observation and surveillance. She declined adjuvant therapy.  Interim History:  Kelly Chavez presents today for a followup visit. Since her last visit, she reports no complaints or changes in her health. She continues to drive and attends to activities of daily living. She still ambulates with the help of walker without any falls or syncope. Her appetite remains reasonable without any weight loss. She does not report any breast masses or lesions. She denied any early satiety or change in her bowel habits. She denied any difficulty breathing or shortness of breath. She denied any cough or hemoptysis.    She has not reported any headaches or blurry vision or syncope. Not reporting any chest pain palpitation or orthopnea. She she has not reported any nausea or vomiting or abdominal pain. Has not reported any frequency urgency or hesitancy. Has not reported any skeletal complaints. Has not reported any arthralgias or myalgias. Rest of the review of systems unremarkable.    Medications: I have reviewed the patient's current medications.  Current Outpatient Prescriptions  Medication Sig  Dispense Refill  . aspirin EC 325 MG tablet Take 1 tablet (325 mg total) by mouth daily. (Patient not taking: Reported on 02/20/2017) 30 tablet 0  . atenolol (TENORMIN) 50 MG tablet TAKE 1 TABLET BY MOUTH EVERY DAY 90 tablet 1  . atorvastatin (LIPITOR) 20 MG tablet     . bictegravir-emtricitabine-tenofovir AF (BIKTARVY) 50-200-25 MG TABS tablet Take 1 tablet by mouth daily. 30 tablet 11  . levothyroxine (SYNTHROID, LEVOTHROID) 100 MCG tablet     . lidocaine-prilocaine (EMLA) cream APPLY 1 APPLICATION TOPICALLY AS NEEDED. (Patient not taking: Reported on 05/24/2017) 30 g 0  . Vitamin D, Ergocalciferol, (DRISDOL) 50000 units CAPS capsule      Current Facility-Administered Medications  Medication Dose Route Frequency Provider Last Rate Last Dose  . baclofen (LIORESAL) tablet 5 mg  5 mg Oral BID Garvin Fila, MD         Allergies: No Known Allergies  Past Medical History, Surgical history, Social history, and Family History were reviewed and updated.  Physical Exam: Blood pressure (!) 148/60, pulse 64, temperature 98 F (36.7 C), temperature source Oral, resp. rate 16, height 5' 4"  (1.626 m), weight 166 lb 4.8 oz (75.4 kg), SpO2 98 %. ECOG: 1 General appearance: Alert, awake: Without distress. Head: Normocephalic, without obvious abnormality no oral thrush or ulcers. Neck: no adenopathy Lymph nodes: Cervical, supraclavicular, and axillary nodes normal. Heart:regular rate and rhythm, S1, S2 normal, no murmur, click, rub or gallop Chest wall examination:  Port-A-Cath in place. Site appeared nontender without erythema. Lung: Clear without wheezes, rhonchi or dullness to percussion. Abdomin: soft, non-tender, without masses or organomegaly no rebound or guarding. EXT:no erythema, induration, or nodules. Slight arm  edema noted. Skin showed no rashes or lesions.  Lab Results: Lab Results  Component Value Date   WBC 5.4 07/23/2017   HGB 12.4 07/23/2017   HCT 38.0 07/23/2017   MCV 97.5  07/23/2017   PLT 211 07/23/2017     Chemistry      Component Value Date/Time   NA 141 07/23/2017 1226   K 4.0 07/23/2017 1226   CL 107 09/13/2015 1345   CO2 26 07/23/2017 1226   BUN 9.9 07/23/2017 1226   CREATININE 0.9 07/23/2017 1226      Component Value Date/Time   CALCIUM 9.8 07/23/2017 1226   ALKPHOS 124 07/23/2017 1226   AST 31 07/23/2017 1226   ALT 34 07/23/2017 1226   BILITOT 0.32 07/23/2017 1226    EXAM: CT CHEST, ABDOMEN, AND PELVIS WITH CONTRAST  TECHNIQUE: Multidetector CT imaging of the chest, abdomen and pelvis was performed following the standard protocol during bolus administration of intravenous contrast.  CONTRAST:  148m ISOVUE-300 IOPAMIDOL (ISOVUE-300) INJECTION 61%  COMPARISON:  Multiple exams, including 06/29/2016  FINDINGS: CT CHEST FINDINGS  Cardiovascular: Atherosclerotic calcification of the aortic arch and coronary arteries. Left subclavian Port-A-Cath tip: SVC.  Mediastinum/Nodes: No pathologic adenopathy. Small type 1 hiatal hernia.  Lungs/Pleura: Incidental accessory fissure in left upper lobe.  0.7 by 0.4 cm lingular nodule on image 66/6 appears to be new. Peripherally in the right upper lobe by a 0.4 cm nodule on image 24/6 appears to be new.  Musculoskeletal: Adjacent to the right subclavian vein and passing between the right pectoralis major and minor muscles, there is abnormal enhancing tissue measuring 3.6 by 2.4 by 3.1 cm. This is essentially new compared to the prior exam and highly suspicious for malignancy.  Similar nodularity along the lateral margin of the pectoralis musculature were there some associated clips on image 20/2.  Atrophy involving the rotator cuff musculature, worse on the left than the right.  Old right rib deformities compatible with healed fractures. The region of presumed thoracic spondylosis is present. 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  CT ABDOMEN PELVIS FINDINGS  Hepatobiliary: Contracted gallbladder.  Otherwise unremarkable.  Pancreas: Unremarkable  Spleen: Unremarkable  Adrenals/Urinary Tract: Right kidney upper pole scarring. Upper pole calyceal diverticulum noted. Right kidney lower pole scarring. Adrenal glands normal.  Stomach/Bowel: Unremarkable  Vascular/Lymphatic: Aortoiliac atherosclerotic vascular disease.  Reproductive: Uterus absent.  Adnexa unremarkable.  Other: Trace free pelvic fluid, image 90/2.  Musculoskeletal: Severe acetabular protrusio and markedly severe degenerative arthropathy of both hips with full-thickness loss of articular cartilage. Grade 1 degenerative anterolisthesis at L4-5.  IMPRESSION: 1. Abnormal enhancing soft tissue density between the right pectoralis muscles extending medially to abut the right subclavian vein, appearance favors malignancy. 2. Several new small right upper lobe pulmonary nodules. These merit surveillance better concerning for metastatic disease. 3. Other imaging findings of potential clinical significance: Aortic Atherosclerosis (ICD10-I70.0). Coronary atherosclerosis. Small type 1 hiatal hernia. 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 Severe bilateral acetabular protrusio. Trace free pelvic fluid, cause uncertain.   Impression and Plan:  71year old woman with the following issues:   1. Invasive ductal carcinoma of the right breast presented with a 4 cm mass as well as palpable adenopathy. She is status post mastectomy with her tumor found to be T4b N1a disease with nipple involvement on 06/15/2014. She declined adjuvant chemotherapy on multiple occasions.  CT scan on 06/29/2016  showed no evidence to suggest recurrent disease at this time and she continues to decline adjuvant therapy.   CT scan obtain on 07/23/2017 showed  possible recurrent disease with possible chest wall metastasis. These findings were discussed today with the patient in detail and the differential diagnosis was reviewed. This is a high likelihood of recurrent disease but I'll like to confirm with a PET scan. She is very reluctant to have further testing or any additional therapy. After discussion today, she agreed to have the PET scan done also on an as done later in November and she will consider systemic therapy at the time. He is not sure she will have her receive chemotherapy however. She is completely asymptomatic at this time and we'll continue to monitor closely.  I will schedule the PET scan and an M.D. follow-up in November 2018 patient and her wishes.    2. IV access: Port-A-Cath inserted and will be utilized for systemic chemotherapy. This will be flushed every 6 weeks.  3. HIV: Followed by Dr. Baxter Flattery and appears to be reasonably stable.  4. Mammography surveillance: She is up-to-date at this time her last mammogram was a week ago.  5. Follow-up: Will be in 6 weeks to follow her progress.  Zola Button, MD 10/10/20181:49 PM

## 2017-07-30 ENCOUNTER — Ambulatory Visit: Payer: Medicare Other | Admitting: Internal Medicine

## 2017-08-02 ENCOUNTER — Other Ambulatory Visit: Payer: Medicare Other

## 2017-08-02 DIAGNOSIS — B20 Human immunodeficiency virus [HIV] disease: Secondary | ICD-10-CM

## 2017-08-02 DIAGNOSIS — Z79899 Other long term (current) drug therapy: Secondary | ICD-10-CM

## 2017-08-02 DIAGNOSIS — Z113 Encounter for screening for infections with a predominantly sexual mode of transmission: Secondary | ICD-10-CM

## 2017-08-03 LAB — COMPREHENSIVE METABOLIC PANEL
AG Ratio: 1.2 (calc) (ref 1.0–2.5)
ALKALINE PHOSPHATASE (APISO): 110 U/L (ref 33–130)
ALT: 26 U/L (ref 6–29)
AST: 26 U/L (ref 10–35)
Albumin: 4 g/dL (ref 3.6–5.1)
BILIRUBIN TOTAL: 0.3 mg/dL (ref 0.2–1.2)
BUN: 16 mg/dL (ref 7–25)
CHLORIDE: 106 mmol/L (ref 98–110)
CO2: 22 mmol/L (ref 20–32)
CREATININE: 0.9 mg/dL (ref 0.60–0.93)
Calcium: 9.4 mg/dL (ref 8.6–10.4)
Globulin: 3.4 g/dL (calc) (ref 1.9–3.7)
Glucose, Bld: 66 mg/dL (ref 65–99)
Potassium: 4.4 mmol/L (ref 3.5–5.3)
Sodium: 141 mmol/L (ref 135–146)
Total Protein: 7.4 g/dL (ref 6.1–8.1)

## 2017-08-03 LAB — LIPID PANEL
CHOLESTEROL: 227 mg/dL — AB (ref <200)
HDL: 49 mg/dL — AB (ref >50)
LDL CHOLESTEROL (CALC): 152 mg/dL — AB
Non-HDL Cholesterol (Calc): 178 mg/dL (calc) — ABNORMAL HIGH (ref <130)
Total CHOL/HDL Ratio: 4.6 (calc) (ref <5.0)
Triglycerides: 135 mg/dL (ref <150)

## 2017-08-03 LAB — T-HELPER CELL (CD4) - (RCID CLINIC ONLY)
CD4 % Helper T Cell: 18 % — ABNORMAL LOW (ref 33–55)
CD4 T Cell Abs: 340 /uL — ABNORMAL LOW (ref 400–2700)

## 2017-08-03 LAB — EXTRA LAV TOP TUBE

## 2017-08-03 LAB — RPR: RPR: NONREACTIVE

## 2017-08-06 LAB — HIV-1 RNA QUANT-NO REFLEX-BLD
HIV 1 RNA Quant: <20 copies/mL
HIV-1 RNA QUANT, LOG: NOT DETECTED {Log_copies}/mL

## 2017-08-16 ENCOUNTER — Ambulatory Visit (INDEPENDENT_AMBULATORY_CARE_PROVIDER_SITE_OTHER): Payer: Medicare Other | Admitting: Internal Medicine

## 2017-08-16 VITALS — BP 149/83 | HR 69 | Temp 98.3°F | Wt 167.0 lb

## 2017-08-16 DIAGNOSIS — C50011 Malignant neoplasm of nipple and areola, right female breast: Secondary | ICD-10-CM

## 2017-08-16 DIAGNOSIS — E7800 Pure hypercholesterolemia, unspecified: Secondary | ICD-10-CM

## 2017-08-16 DIAGNOSIS — I10 Essential (primary) hypertension: Secondary | ICD-10-CM | POA: Diagnosis present

## 2017-08-16 DIAGNOSIS — I6350 Cerebral infarction due to unspecified occlusion or stenosis of unspecified cerebral artery: Secondary | ICD-10-CM | POA: Diagnosis not present

## 2017-08-16 DIAGNOSIS — B20 Human immunodeficiency virus [HIV] disease: Secondary | ICD-10-CM | POA: Diagnosis not present

## 2017-08-16 NOTE — Progress Notes (Signed)
RFV: hiv disease follow up  Patient ID: Kelly Chavez, female   DOB: Nov 12, 1945, 71 y.o.   MRN: [id number]  HPI 71yo F with hiv disease, CD 4 count of 340/LV<20 on truvada/DRVr but attempted to switch her to biktarvy which she has not started. She was last  Seen 3 wk ago by dr Alen Blew who follows her for invasive breast ca, T4bN1a. Her tumor showed lymphovascular invasion with involvement of the nipple but not the nipple and dermal lymphatics. One out of 16 lymph nodes involved. She is status post right modified radical mastectomy and insertion of a Port-A-Cath done on 06/15/2014. Deferred chemo. Latest CT shows possible recurrent of breast CA. She is going to have pet scan next week.she is here with her son who is getting caught up on status of hiv disease.  Patient is doing well.   Outpatient Encounter Prescriptions as of 08/16/2017  Medication Sig  . atenolol (TENORMIN) 50 MG tablet TAKE 1 TABLET BY MOUTH EVERY DAY  . TRUVADA 200-300 MG tablet   . aspirin EC 325 MG tablet Take 1 tablet (325 mg total) by mouth daily. (Patient not taking: Reported on 02/20/2017)  . atorvastatin (LIPITOR) 20 MG tablet   . bictegravir-emtricitabine-tenofovir AF (BIKTARVY) 50-200-25 MG TABS tablet Take 1 tablet by mouth daily. (Patient not taking: Reported on 08/16/2017)  . levothyroxine (SYNTHROID, LEVOTHROID) 100 MCG tablet   . lidocaine-prilocaine (EMLA) cream APPLY 1 APPLICATION TOPICALLY AS NEEDED. (Patient not taking: Reported on 05/24/2017)  . Vitamin D, Ergocalciferol, (DRISDOL) 50000 units CAPS capsule    Facility-Administered Encounter Medications as of 08/16/2017  Medication  . baclofen (LIORESAL) tablet 5 mg     Patient Active Problem List   Diagnosis Date Noted  . Port catheter in place 06/28/2016  . Spastic hemiplegia affecting nondominant side ([place]) 11/29/2015  . 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 Acute CVA (cerebrovascular accident) ([place]) 09/14/2015  . CVA  (cerebral infarction) 09/13/2015  . Hyperlipidemia 05/31/2014  . Pedal edema 05/11/2014  . Paranoia ([place]) 05/11/2014  . Breast cancer of upper-inner quadrant of right female breast ([place]) 04/15/2014  . History of pituitary tumor 01/28/2014  . Avascular necrosis of bones of both hips ([place]) 01/28/2014  . Genital herpes 01/28/2014  . Ovarian cyst, bilateral 01/26/2014  . Essential hypertension, benign 01/26/2014  . Unspecified vitamin D deficiency 01/26/2014  . DJD (degenerative joint disease) 01/26/2014  . HIV disease ([place]) 01/07/2014     Health Maintenance Due  Topic Date Due  . TETANUS/TDAP  10/21/1965  . COLONOSCOPY  10/21/1996  . DEXA SCAN  10/22/2011  . PNA vac Low Risk Adult (2 of 2 - PCV13) 01/23/2015  . INFLUENZA VACCINE  05/16/2017     Review of Systems Review of Systems  Constitutional: Negative for fever, chills, diaphoresis, activity change, appetite change, fatigue and unexpected weight change.  HENT: Negative for congestion, sore throat, rhinorrhea, sneezing, trouble swallowing and sinus pressure.  Eyes: Negative for photophobia and visual disturbance.  Respiratory: Negative for cough, chest tightness, shortness of breath, wheezing and stridor.  Cardiovascular: Negative for chest pain, palpitations and leg swelling.  Gastrointestinal: Negative for nausea, vomiting, abdominal pain, diarrhea, constipation, blood in stool, abdominal distention and anal bleeding.  Genitourinary: Negative for dysuria, hematuria, flank pain and difficulty urinating.  Musculoskeletal: Negative for myalgias, back pain, joint swelling, arthralgias and gait problem.  Skin: Negative for color change, pallor, rash and wound.  Neurological: Negative for dizziness, tremors, weakness and light-headedness.  Hematological: Negative for adenopathy. Does not bruise/bleed easily.  Psychiatric/Behavioral: Negative for behavioral problems, confusion, sleep disturbance, dysphoric mood, decreased  concentration and agitation.    Physical Exam   BP (!) 149/83   Pulse 69   Temp 98.3 F (36.8 C) (Oral)   Wt 167 lb (75.8 kg)   BMI 28.67 kg/m   Physical Exam  Constitutional:  oriented to person, place, and time. appears well-developed and well-nourished. No distress.  HENT: [place]/AT, PERRLA, no scleral icterus Mouth/Throat: Oropharynx is clear and moist. No oropharyngeal exudate.  Cardiovascular: Normal rate, regular rhythm and normal heart sounds. Exam reveals no gallop and no friction rub.  No murmur heard.  Pulmonary/Chest: Effort normal and breath sounds normal. No respiratory distress.  has no wheezes.  Neck = supple, no nuchal rigidity Abdominal: Soft. Bowel sounds are normal.  exhibits no distension. There is no tenderness.  Lymphadenopathy: no cervical adenopathy. No axillary adenopathy Neurological: alert and oriented to person, place, and time.  Skin: Skin is warm and dry. No rash noted. No erythema.  Psychiatric: a normal mood and affect.  behavior is normal.   Lab Results  Component Value Date   CD4TCELL 18 (L) 08/02/2017   Lab Results  Component Value Date   CD4TABS 340 (L) 08/02/2017   CD4TABS 500 08/17/2015   CD4TABS 490 03/18/2015   Lab Results  Component Value Date   HIV1RNAQUANT <20 NOT DETECTED 08/02/2017   Lab Results  Component Value Date   HEPBSAB NEG 01/22/2014   Lab Results  Component Value Date   LABRPR NON-REACTIVE 08/02/2017    CBC Lab Results  Component Value Date   WBC 5.4 07/23/2017   RBC 3.90 07/23/2017   HGB 12.4 07/23/2017   HCT 38.0 07/23/2017   PLT 211 07/23/2017   MCV 97.5 07/23/2017   MCH 31.9 07/23/2017   MCHC 32.7 07/23/2017   RDW 14.0 07/23/2017   LYMPHSABS 1.5 07/23/2017   MONOABS 0.4 07/23/2017   EOSABS 0.0 07/23/2017    BMET Lab Results  Component Value Date   NA 141 08/02/2017   K 4.4 08/02/2017   CL 106 08/02/2017   CO2 22 08/02/2017   GLUCOSE 66 08/02/2017   BUN 16 08/02/2017   CREATININE 0.90  08/02/2017   CALCIUM 9.4 08/02/2017   GFRNONAA >60 09/13/2015   GFRAA >60 09/13/2015    Assessment and Plan  hiv disease =will switch to biktarvy, given instructions with her son who understands what the plan is to convert to an easier regimen  htn = under moderate-good control. continue with current regimen. Follow up with pcp  Hx breast cancer = surveillance scan showing lesions suspicious for recurrence. Has upcoming PET scan for further evaluation. In the past has been resistant to chemo, possibly will change her mind given new findings.  Hypercholesteremia = now total chol at 223 but likely due to her HDL being in low 50s. I don't think she needs to take lipid lowering agent since she is pill adverse. Overall would just repeat cholesterol panel in 6 months  Discussed plan with patient and her son.

## 2017-08-31 ENCOUNTER — Ambulatory Visit (HOSPITAL_BASED_OUTPATIENT_CLINIC_OR_DEPARTMENT_OTHER): Payer: Medicare Other

## 2017-08-31 ENCOUNTER — Encounter (HOSPITAL_COMMUNITY)
Admission: RE | Admit: 2017-08-31 | Discharge: 2017-08-31 | Disposition: A | Discharge status: Home / Self Care | Payer: Medicare Other | Source: Ambulatory Visit | Attending: Oncology | Admitting: Oncology

## 2017-08-31 ENCOUNTER — Other Ambulatory Visit (HOSPITAL_BASED_OUTPATIENT_CLINIC_OR_DEPARTMENT_OTHER): Payer: Medicare Other

## 2017-08-31 DIAGNOSIS — Z853 Personal history of malignant neoplasm of breast: Secondary | ICD-10-CM | POA: Diagnosis present

## 2017-08-31 DIAGNOSIS — C50211 Malignant neoplasm of upper-inner quadrant of right female breast: Secondary | ICD-10-CM | POA: Diagnosis not present

## 2017-08-31 DIAGNOSIS — Z95828 Presence of other vascular implants and grafts: Secondary | ICD-10-CM

## 2017-08-31 LAB — CBC WITH DIFFERENTIAL/PLATELET
BASO%: 0.6 % (ref 0.0–2.0)
Basophils Absolute: 0 10*3/uL (ref 0.0–0.1)
EOS%: 1.1 % (ref 0.0–7.0)
Eosinophils Absolute: 0.1 10*3/uL (ref 0.0–0.5)
HCT: 38.3 % (ref 34.8–46.6)
HGB: 12.2 g/dL (ref 11.6–15.9)
LYMPH%: 36.2 % (ref 14.0–49.7)
MCH: 31.4 pg (ref 25.1–34.0)
MCHC: 31.9 g/dL (ref 31.5–36.0)
MCV: 98.2 fL (ref 79.5–101.0)
MONO#: 0.4 10*3/uL (ref 0.1–0.9)
MONO%: 8.1 % (ref 0.0–14.0)
NEUT%: 54 % (ref 38.4–76.8)
NEUTROS ABS: 2.9 10*3/uL (ref 1.5–6.5)
PLATELETS: 220 10*3/uL (ref 145–400)
RBC: 3.9 10*6/uL (ref 3.70–5.45)
RDW: 14.2 % (ref 11.2–14.5)
WBC: 5.3 10*3/uL (ref 3.9–10.3)
lymph#: 1.9 10*3/uL (ref 0.9–3.3)

## 2017-08-31 LAB — COMPREHENSIVE METABOLIC PANEL
ALBUMIN: 3.4 g/dL — AB (ref 3.5–5.0)
ALK PHOS: 104 U/L (ref 40–150)
ALT: 43 U/L (ref 0–55)
AST: 32 U/L (ref 5–34)
Anion Gap: 9 mEq/L (ref 3–11)
BILIRUBIN TOTAL: 0.26 mg/dL (ref 0.20–1.20)
BUN: 21.6 mg/dL (ref 7.0–26.0)
CALCIUM: 9.4 mg/dL (ref 8.4–10.4)
CO2: 24 mEq/L (ref 22–29)
Chloride: 109 mEq/L (ref 98–109)
Creatinine: 1.5 mg/dL — ABNORMAL HIGH (ref 0.6–1.1)
EGFR: 41 mL/min/{1.73_m2} — AB (ref >60)
Glucose: 76 mg/dl (ref 70–140)
POTASSIUM: 4.3 meq/L (ref 3.5–5.1)
Sodium: 141 mEq/L (ref 136–145)
Total Protein: 7.8 g/dL (ref 6.4–8.3)

## 2017-08-31 LAB — GLUCOSE, CAPILLARY: Glucose-Capillary: 68 mg/dL (ref 65–99)

## 2017-08-31 MED ORDER — FLUDEOXYGLUCOSE F - 18 (FDG) INJECTION
8.4000 | Freq: Once | INTRAVENOUS | Status: AC | PRN
  Administered 2017-08-31: 8.4 via INTRAVENOUS

## 2017-08-31 MED ORDER — SODIUM CHLORIDE 0.9 % IJ SOLN
10.0000 mL | INTRAMUSCULAR | Status: DC | PRN
  Administered 2017-08-31: 10 mL via INTRAVENOUS
  Filled 2017-08-31: qty 10

## 2017-09-04 ENCOUNTER — Telehealth: Payer: Self-pay | Admitting: Oncology

## 2017-09-04 ENCOUNTER — Ambulatory Visit (HOSPITAL_BASED_OUTPATIENT_CLINIC_OR_DEPARTMENT_OTHER): Payer: Medicare Other | Admitting: Oncology

## 2017-09-04 VITALS — BP 165/87 | HR 82 | Temp 97.7°F | Resp 18 | Ht 64.0 in | Wt 171.7 lb

## 2017-09-04 DIAGNOSIS — B20 Human immunodeficiency virus [HIV] disease: Secondary | ICD-10-CM

## 2017-09-04 DIAGNOSIS — C50911 Malignant neoplasm of unspecified site of right female breast: Secondary | ICD-10-CM

## 2017-09-04 DIAGNOSIS — C50211 Malignant neoplasm of upper-inner quadrant of right female breast: Secondary | ICD-10-CM

## 2017-09-04 NOTE — Telephone Encounter (Signed)
Scheduled appt per 11/20 los - unable to schedule  10/17/17 per patient preference scheduled on 10/25/17.Gave patient AVS and calender per los.

## 2017-09-04 NOTE — Progress Notes (Signed)
Hematology and Oncology Follow Up Visit  Kelly Chavez [id number] 1946/04/09 71 y.o. 09/04/2017 12:41 PM Kelly Chavez, MDShelton, Kelly Millin, MD   Principle Diagnosis: 71 year old woman with stage IIB (T2 N1) right-sided breast cancer. 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  He developed advanced disease detected in November 2018 with chest wall and possible lung recurrence.   Prior Therapy:  She is status post biopsy on 04/09/2014 which confirmed the presence of invasive carcinoma with axillary lymph node involvement. She is status post right modified radical mastectomy and insertion of a Port-A-Cath done on 06/15/2014. Her final pathology revealed T4bN1a. Her tumor showed lymphovascular invasion with involvement of the nipple but not the nipple and dermal lymphatics. One out of 16 lymph nodes involved.  Current therapy: Under evaluation to start systemic therapy.  Interim History:  [name] presents today for a followup visit with her son. Since her last visit, she reports no issues.  He remains reasonably active without any changes in her performance status or quality of life. Her appetite remains reasonable without any weight loss. She does not report any breast masses or lesions. She denied any early satiety or change in her bowel habits. She denied any difficulty breathing or shortness of breath.  She denies any falls or syncope.    She has not reported any headaches or blurry vision or syncope. Not reporting any chest pain palpitation or orthopnea. She she has not reported any nausea or vomiting or abdominal pain. Has not reported any frequency urgency or hesitancy. Has not reported any skeletal complaints. Has not reported any arthralgias or myalgias. Rest of the review of systems unremarkable.    Medications: I have reviewed the patient's current medications.  Current Outpatient  Medications  Medication Sig Dispense Refill  . aspirin EC 325 MG tablet Take 1 tablet (325 mg total) by mouth daily. (Patient not taking: Reported on 02/20/2017) 30 tablet 0  . atenolol (TENORMIN) 50 MG tablet TAKE 1 TABLET BY MOUTH EVERY DAY 90 tablet 1  . atorvastatin (LIPITOR) 20 MG tablet     . bictegravir-emtricitabine-tenofovir AF (BIKTARVY) 50-200-25 MG TABS tablet Take 1 tablet by mouth daily. (Patient not taking: Reported on 08/16/2017) 30 tablet 11  . levothyroxine (SYNTHROID, LEVOTHROID) 100 MCG tablet     . lidocaine-prilocaine (EMLA) cream APPLY 1 APPLICATION TOPICALLY AS NEEDED. (Patient not taking: Reported on 05/24/2017) 30 g 0  . TRUVADA 200-300 MG tablet     . Vitamin D, Ergocalciferol, (DRISDOL) 50000 units CAPS capsule      Current Facility-Administered Medications  Medication Dose Route Frequency Provider Last Rate Last Dose  . baclofen (LIORESAL) tablet 5 mg  5 mg Oral BID Garvin Fila, MD         Allergies: No Known Allergies  Past Medical History, Surgical history, Social history, and Family History were reviewed and updated.  Physical Exam: Blood pressure (!) 165/87, pulse 82, temperature 97.7 F (36.5 C), temperature source Oral, resp. rate 18, height 5' 4"  (1.626 m), weight 171 lb 11.2 oz (77.9 kg), SpO2 99 %. ECOG: 1 General appearance: Well-appearing woman without distress. Head: Normocephalic, without obvious abnormality no oral ulcers or lesions. Neck: no adenopathy Lymph nodes: Cervical, supraclavicular, and axillary nodes normal. Heart:regular rate and rhythm, S1, S2 normal, no murmur, click, rub or gallop Chest wall examination:  Port-A-Cath in place.  No masses or lesions. Lung: Clear without wheezes, rhonchi or  dullness to percussion. Abdomin: soft, non-tender, without masses or organomegaly no shifting dullness or ascites. EXT:no erythema, induration, or nodules.  Skin showed no rashes or lesions.  Lab Results: Lab Results  Component Value Date    WBC 5.3 08/31/2017   HGB 12.2 08/31/2017   HCT 38.3 08/31/2017   MCV 98.2 08/31/2017   PLT 220 08/31/2017     Chemistry      Component Value Date/Time   NA 141 08/31/2017 1110   K 4.3 08/31/2017 1110   CL 106 08/02/2017 1335   CO2 24 08/31/2017 1110   BUN 21.6 08/31/2017 1110   CREATININE 1.5 (H) 08/31/2017 1110      Component Value Date/Time   CALCIUM 9.4 08/31/2017 1110   ALKPHOS 104 08/31/2017 1110   AST 32 08/31/2017 1110   ALT 43 08/31/2017 1110   BILITOT 0.26 08/31/2017 1110     EXAM: NUCLEAR MEDICINE PET SKULL BASE TO THIGH  TECHNIQUE: 8.8 mCi F-18 FDG was injected intravenously. Full-ring PET imaging was performed from the skull base to thigh after the radiotracer. CT data was obtained and used for attenuation correction and anatomic localization.  FASTING BLOOD GLUCOSE:  Value: 68 mg/dl  COMPARISON:  CT chest 07/23/2017, PET-CT 05/27/2015  FINDINGS: NECK  No hypermetabolic lymph nodes in the neck.  CHEST  Within the anterior RIGHT chest wall, nodule lesion positioned between the muscle layers of the pectoralis musculature measures 15 mm (image 55, series 4) has intense metabolic activity for size with SUV max equal 11.6. There is less defined soft tissue more superiorly adjacent to this nodule (image 49, series 4) also with hypermetabolic activity (SUV max equal 4.0.  Post surgical change in the RIGHT breast and axillary lymphadenectomy.  Within LEFT upper lobe 6 mm nodule (image 63, series 4) has faint radiotracer activity.  ABDOMEN/PELVIS  No abnormal hypermetabolic activity within the liver, pancreas, adrenal glands, or spleen. No hypermetabolic lymph nodes in the abdomen or pelvis.  SKELETON  There is intense uptake associated with the RIGHT humeral head. There is serpiginous lesion with sub chondral collapse (favored degenerative change).  No additional evidence skeletal metastasis per  IMPRESSION: 1. Local breast  cancer recurrence with hypermetabolic nodularity between the muscle layers of the RIGHT pectoralis muscle. 2. LEFT upper lung lobe pulmonary nodule with mild metabolic activity is concerning for pulmonary metastasis. 3. Subchondral collapse of the RIGHT femoral head with intense metabolic activity. Favor degenerative change and less likely pathologic fracture from tumor metastasis.    Impression and Plan:  71 year old woman with the following issues:   1. Invasive ductal carcinoma of the right breast presented with a 4 cm mass as well as palpable adenopathy. She is status post mastectomy with her tumor found to be T4b N1a disease with nipple involvement on 06/15/2014. She declined adjuvant chemotherapy on multiple occasions.  CT scan on 06/29/2016 showed no evidence to suggest recurrent disease at this time and she continues to decline adjuvant therapy.   CT scan obtain on 07/23/2017 showed possible recurrent disease with possible chest wall metastasis.   PET/CT scan obtained on 08/31/2017 showed hypermetabolic activity suggesting recurrence within the chest wall musculature and possible lung metastasis.  The natural course of this disease was discussed today with the patient and her son.  At this point, her malignancy is incurable although it can be palliated for an extended period of time.  The rationale for using systemic chemotherapy as a single agent and asymptomatic breast cancer was discussed today.  Different options exist including oral Xeloda versus intravenous taxanes-based chemotherapy.  Risks and benefits of both approaches were reviewed today and complications were discussed.  My preference is to proceed with oral Xeloda at 1000 mg twice a day for 14 days and then a week off.  I recommend treating her 3 months before repeating scans.  Written information about this drug was given to the patient and we will check with Dr. Baxter Flattery to ensure there is no interaction with her HIV  medication.  She will think about it and let me know in the immediate future and she understands the sooner she start therapy to better results in an effort to palliate her cancer long-term.    2. IV access: Port-A-Cath inserted and will be utilized for systemic chemotherapy. This will be flushed every 6 weeks.  3. HIV: Followed by Dr. Baxter Flattery.   4.  Prognosis: She has an incurable malignancy although malignancy that can be palliated for an extended period of time.  She has good performance status and she would be a good candidate for single agent chemotherapy for palliative purposes.  She understands without treatment, his disease will progress and likely lead to her death in less than a year.  5. Follow-up: Will be in 6 weeks to follow her progress.  Zola Button, MD 11/20/201812:41 PM

## 2017-09-20 ENCOUNTER — Other Ambulatory Visit: Payer: Self-pay

## 2017-09-20 ENCOUNTER — Inpatient Hospital Stay (HOSPITAL_COMMUNITY)
Admission: EM | Admit: 2017-09-20 | Discharge: 2017-09-22 | DRG: 603 | Disposition: A | Discharge status: Home Health | Payer: Medicare Other | Attending: Internal Medicine | Admitting: Internal Medicine

## 2017-09-20 ENCOUNTER — Emergency Department (HOSPITAL_COMMUNITY): Payer: Medicare Other

## 2017-09-20 ENCOUNTER — Encounter (HOSPITAL_COMMUNITY): Payer: Self-pay | Admitting: Emergency Medicine

## 2017-09-20 ENCOUNTER — Emergency Department (HOSPITAL_BASED_OUTPATIENT_CLINIC_OR_DEPARTMENT_OTHER)
Admit: 2017-09-20 | Discharge: 2017-09-20 | Disposition: A | Discharge status: Home / Self Care | Payer: Medicare Other | Attending: Emergency Medicine | Admitting: Emergency Medicine

## 2017-09-20 DIAGNOSIS — R531 Weakness: Secondary | ICD-10-CM | POA: Diagnosis present

## 2017-09-20 DIAGNOSIS — F419 Anxiety disorder, unspecified: Secondary | ICD-10-CM | POA: Diagnosis present

## 2017-09-20 DIAGNOSIS — L03115 Cellulitis of right lower limb: Principal | ICD-10-CM | POA: Diagnosis present

## 2017-09-20 DIAGNOSIS — F22 Delusional disorders: Secondary | ICD-10-CM | POA: Diagnosis present

## 2017-09-20 DIAGNOSIS — C50211 Malignant neoplasm of upper-inner quadrant of right female breast: Secondary | ICD-10-CM | POA: Diagnosis present

## 2017-09-20 DIAGNOSIS — I10 Essential (primary) hypertension: Secondary | ICD-10-CM | POA: Diagnosis not present

## 2017-09-20 DIAGNOSIS — Z853 Personal history of malignant neoplasm of breast: Secondary | ICD-10-CM

## 2017-09-20 DIAGNOSIS — L03119 Cellulitis of unspecified part of limb: Secondary | ICD-10-CM | POA: Diagnosis not present

## 2017-09-20 DIAGNOSIS — E785 Hyperlipidemia, unspecified: Secondary | ICD-10-CM | POA: Diagnosis present

## 2017-09-20 DIAGNOSIS — E7849 Other hyperlipidemia: Secondary | ICD-10-CM

## 2017-09-20 DIAGNOSIS — Z8249 Family history of ischemic heart disease and other diseases of the circulatory system: Secondary | ICD-10-CM

## 2017-09-20 DIAGNOSIS — L03116 Cellulitis of left lower limb: Secondary | ICD-10-CM | POA: Diagnosis present

## 2017-09-20 DIAGNOSIS — Z823 Family history of stroke: Secondary | ICD-10-CM

## 2017-09-20 DIAGNOSIS — M7989 Other specified soft tissue disorders: Secondary | ICD-10-CM

## 2017-09-20 DIAGNOSIS — B20 Human immunodeficiency virus [HIV] disease: Secondary | ICD-10-CM | POA: Diagnosis not present

## 2017-09-20 DIAGNOSIS — Z8673 Personal history of transient ischemic attack (TIA), and cerebral infarction without residual deficits: Secondary | ICD-10-CM

## 2017-09-20 DIAGNOSIS — Z833 Family history of diabetes mellitus: Secondary | ICD-10-CM

## 2017-09-20 DIAGNOSIS — Z7982 Long term (current) use of aspirin: Secondary | ICD-10-CM

## 2017-09-20 DIAGNOSIS — Z90710 Acquired absence of both cervix and uterus: Secondary | ICD-10-CM

## 2017-09-20 DIAGNOSIS — Z85038 Personal history of other malignant neoplasm of large intestine: Secondary | ICD-10-CM

## 2017-09-20 DIAGNOSIS — Z9221 Personal history of antineoplastic chemotherapy: Secondary | ICD-10-CM

## 2017-09-20 DIAGNOSIS — G8110 Spastic hemiplegia affecting unspecified side: Secondary | ICD-10-CM

## 2017-09-20 DIAGNOSIS — Z9011 Acquired absence of right breast and nipple: Secondary | ICD-10-CM

## 2017-09-20 DIAGNOSIS — Z79899 Other long term (current) drug therapy: Secondary | ICD-10-CM

## 2017-09-20 DIAGNOSIS — Z8349 Family history of other endocrine, nutritional and metabolic diseases: Secondary | ICD-10-CM

## 2017-09-20 LAB — CBC WITH DIFFERENTIAL/PLATELET
BASOS ABS: 0 10*3/uL (ref 0.0–0.1)
BASOS PCT: 0 %
Eosinophils Absolute: 0 10*3/uL (ref 0.0–0.7)
Eosinophils Relative: 0 %
HEMATOCRIT: 35.5 % — AB (ref 36.0–46.0)
HEMOGLOBIN: 11.5 g/dL — AB (ref 12.0–15.0)
LYMPHS PCT: 20 %
Lymphs Abs: 1.4 10*3/uL (ref 0.7–4.0)
MCH: 31.9 pg (ref 26.0–34.0)
MCHC: 32.4 g/dL (ref 30.0–36.0)
MCV: 98.6 fL (ref 78.0–100.0)
MONO ABS: 0.9 10*3/uL (ref 0.1–1.0)
Monocytes Relative: 12 %
NEUTROS ABS: 4.7 10*3/uL (ref 1.7–7.7)
NEUTROS PCT: 68 %
Platelets: 213 10*3/uL (ref 150–400)
RBC: 3.6 MIL/uL — AB (ref 3.87–5.11)
RDW: 13.7 % (ref 11.5–15.5)
WBC: 7 10*3/uL (ref 4.0–10.5)

## 2017-09-20 LAB — COMPREHENSIVE METABOLIC PANEL
ALBUMIN: 3.4 g/dL — AB (ref 3.5–5.0)
ALT: 26 U/L (ref 14–54)
AST: 27 U/L (ref 15–41)
Alkaline Phosphatase: 106 U/L (ref 38–126)
Anion gap: 8 (ref 5–15)
BILIRUBIN TOTAL: 0.8 mg/dL (ref 0.3–1.2)
BUN: 10 mg/dL (ref 6–20)
CO2: 25 mmol/L (ref 22–32)
CREATININE: 0.87 mg/dL (ref 0.44–1.00)
Calcium: 8.9 mg/dL (ref 8.9–10.3)
Chloride: 104 mmol/L (ref 101–111)
GFR calc Af Amer: >60 mL/min (ref >60)
GFR calc non Af Amer: >60 mL/min (ref >60)
GLUCOSE: 88 mg/dL (ref 65–99)
POTASSIUM: 3.8 mmol/L (ref 3.5–5.1)
Sodium: 137 mmol/L (ref 135–145)
TOTAL PROTEIN: 7.3 g/dL (ref 6.5–8.1)

## 2017-09-20 MED ORDER — BACLOFEN 10 MG PO TABS
5.0000 mg | ORAL_TABLET | Freq: Two times a day (BID) | ORAL | Status: DC
  Administered 2017-09-21 (×3): 5 mg via ORAL
  Filled 2017-09-20 (×4): qty 1

## 2017-09-20 MED ORDER — HYDROCODONE-ACETAMINOPHEN 5-325 MG PO TABS: 1.0000 | ORAL_TABLET | Freq: Once | ORAL | Status: DC

## 2017-09-20 MED ORDER — CEFAZOLIN SODIUM-DEXTROSE 1-4 GM/50ML-% IV SOLN
1.0000 g | Freq: Three times a day (TID) | INTRAVENOUS | Status: DC
  Administered 2017-09-21 – 2017-09-22 (×5): 1 g via INTRAVENOUS
  Filled 2017-09-20 (×5): qty 50

## 2017-09-20 MED ORDER — ACETAMINOPHEN 325 MG PO TABS
650.0000 mg | ORAL_TABLET | Freq: Four times a day (QID) | ORAL | Status: DC | PRN
  Administered 2017-09-21 (×2): 650 mg via ORAL
  Filled 2017-09-20 (×2): qty 2

## 2017-09-20 MED ORDER — ACETAMINOPHEN 650 MG RE SUPP
650.0000 mg | Freq: Four times a day (QID) | RECTAL | Status: DC | PRN
Start: 2017-09-20 — End: 2017-09-22

## 2017-09-20 MED ORDER — VANCOMYCIN HCL IN DEXTROSE 1-5 GM/200ML-% IV SOLN
1000.0000 mg | Freq: Once | INTRAVENOUS | Status: AC
  Administered 2017-09-20: 1000 mg via INTRAVENOUS
  Filled 2017-09-20: qty 200

## 2017-09-20 MED ORDER — DEXTROSE 5 % IV SOLN
1.0000 g | Freq: Once | INTRAVENOUS | Status: AC
  Administered 2017-09-20: 1 g via INTRAVENOUS
  Filled 2017-09-20: qty 10

## 2017-09-20 MED ORDER — KETOROLAC TROMETHAMINE 15 MG/ML IJ SOLN
15.0000 mg | Freq: Four times a day (QID) | INTRAMUSCULAR | Status: DC | PRN
  Administered 2017-09-21: 19:00:00 15 mg via INTRAVENOUS
  Filled 2017-09-20: qty 1

## 2017-09-20 MED ORDER — ATENOLOL 50 MG PO TABS
50.0000 mg | ORAL_TABLET | Freq: Every day | ORAL | Status: DC
  Administered 2017-09-21 – 2017-09-22 (×2): 50 mg via ORAL
  Filled 2017-09-20 (×3): qty 1

## 2017-09-20 MED ORDER — HEPARIN SODIUM (PORCINE) 5000 UNIT/ML IJ SOLN
5000.0000 [IU] | Freq: Three times a day (TID) | INTRAMUSCULAR | Status: DC
  Administered 2017-09-21 (×2): 5000 [IU] via SUBCUTANEOUS
  Filled 2017-09-20 (×3): qty 1

## 2017-09-20 MED ORDER — MORPHINE SULFATE (PF) 4 MG/ML IV SOLN
4.0000 mg | Freq: Once | INTRAVENOUS | Status: AC
  Administered 2017-09-20: 4 mg via INTRAVENOUS
  Filled 2017-09-20: qty 1

## 2017-09-20 MED ORDER — BICTEGRAVIR-EMTRICITAB-TENOFOV 50-200-25 MG PO TABS
1.0000 | ORAL_TABLET | Freq: Every day | ORAL | Status: DC
  Administered 2017-09-21 – 2017-09-22 (×2): 1 via ORAL
  Filled 2017-09-20 (×3): qty 1

## 2017-09-20 NOTE — Progress Notes (Signed)
Report was given and patient will be transferred to the floor shortly

## 2017-09-20 NOTE — ED Triage Notes (Addendum)
Per EMS states B/L lower extremity edema-was recently diagnosed with cancer and has refused treatment-unsure if swelling is a new-left leg pain-doesn't feel well-per EMS, states they were initially called out for a stroke-states son called her this am and her speech was slurred-history of stroke-CBG 127, BP 170/80

## 2017-09-20 NOTE — Progress Notes (Signed)
BLE venous duplex prelim: no obvious evidence of DVT. Landry Mellow, RDMS, RVT

## 2017-09-20 NOTE — ED Notes (Signed)
Bed: HT34 Expected date:  Expected time:  Means of arrival:  Comments: EMS-edema

## 2017-09-20 NOTE — ED Provider Notes (Signed)
[facility] DEPT Provider Note   CSN: [id number] Arrival date & time: 09/20/17  1305     History   Chief Complaint Chief Complaint  Patient presents with  . Leg Swelling    HPI Kelly Chavez is a 71 y.o. female.  The history is provided by the patient and medical records. No language interpreter was used.     Kelly Chavez is a 71 y.o. female  with a PMH of right-sided breast cancer not undergoing treatment who presents to the Emergency Department complaining of bilateral lower extremity pain and swelling which began yesterday. Pain is worse with ambulation, better when still. Worst in the left ankle. This morning pain to LE's with ambulation intensified to the point that she could no longer ambulate. No known injury. No recent surgeries or immobilizations.  No fever, chills.   Past Medical History:  Diagnosis Date  . Anxiety   . Avascular necrosis of bones of both hips ([place])   . Brain tumor (benign) ([place])   . Cancer of sigmoid colon [facility]) 2011   sigmoid colectomy   . Complication of anesthesia    senitive to all  . HIV (human immunodeficiency virus infection) ([place])   . Hypertension   . 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 Status post chemotherapy 2012  . Stroke [facility])     Patient Active Problem List   Diagnosis Date Noted  . Port catheter in place 06/28/2016  . Spastic hemiplegia affecting nondominant side ([place]) 11/29/2015  . 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 Acute CVA (cerebrovascular accident) ([place]) 09/14/2015  . CVA (cerebral infarction) 09/13/2015  . Hyperlipidemia 05/31/2014  . Pedal edema 05/11/2014  . Paranoia ([place]) 05/11/2014  . Breast cancer of upper-inner quadrant of right female breast ([place]) 04/15/2014  . History of pituitary tumor 01/28/2014  . Avascular necrosis of bones of both hips ([place]) 01/28/2014  . Genital herpes  01/28/2014  . Ovarian cyst, bilateral 01/26/2014  . Essential hypertension, benign 01/26/2014  . Unspecified vitamin D deficiency 01/26/2014  . DJD (degenerative joint disease) 01/26/2014  . HIV disease ([place]) 01/07/2014    Past Surgical History:  Procedure Laterality Date  . ABDOMINAL HYSTERECTOMY    . COLON SURGERY     partial colectomy  . MASTECTOMY MODIFIED RADICAL Right 06/15/2014   Procedure: RIGHT MODIFIED RADICAL MASTECTOMY;  Surgeon: Shann Medal, MD;  Location: [facility];  Service: General;  Laterality: Right;  . PORTACATH PLACEMENT N/A 06/15/2014   Procedure: INSERTION PORT-A-CATH LEFT SUBCLAVIAN;  Surgeon: Shann Medal, MD;  Location: [place];  Service: General;  Laterality: N/A;  . TONSILLECTOMY    . TOTAL ABDOMINAL HYSTERECTOMY W/ BILATERAL SALPINGOOPHORECTOMY  2012    OB History    Gravida Para Term Preterm AB Living   1             SAB TAB Ectopic Multiple Live Births                   Home Medications    Prior to Admission medications   Medication Sig Start Date End Date Taking? Authorizing Provider  atenolol (TENORMIN) 50 MG tablet TAKE 1 TABLET BY MOUTH EVERY DAY 05/28/17  Yes [name], MD  bictegravir-emtricitabine-tenofovir AF (BIKTARVY) 50-200-25 MG TABS tablet Take 1 tablet by mouth daily. 06/07/17  Yes [name], MD  Multiple Vitamins-Minerals (AIRBORNE PO) Take 1 tablet by mouth  daily.   Yes [provider]  Turmeric 500 MG TABS Take 1 tablet by mouth daily.   Yes [provider]  aspirin EC 325 MG tablet Take 1 tablet (325 mg total) by mouth daily. Patient not taking: Reported on 02/20/2017 09/15/15   Modena Jansky, MD  lidocaine-prilocaine (EMLA) cream APPLY 1 APPLICATION TOPICALLY AS NEEDED. Patient not taking: Reported on 05/24/2017 04/01/15   Wyatt Portela, MD    Family History Family History  Problem Relation Age of Onset  . Diabetes Father   . Hyperlipidemia Father   . Stroke Father   . Hypertension Mother   .  Hyperlipidemia Mother   . Dementia Mother   . Diabetes Sister   . Diabetes Brother   . Stroke Brother     Social History Social History   Tobacco Use  . Smoking status: Never Smoker  . Smokeless tobacco: Never Used  Substance Use Topics  . Alcohol use: No    Alcohol/week: 0.0 oz  . Drug use: No     Allergies   Patient has no known allergies.   Review of Systems Review of Systems  Musculoskeletal: Positive for joint swelling and myalgias.  Skin: Positive for color change.  All other systems reviewed and are negative.    Physical Exam Updated Vital Signs BP (!) 156/77 (BP Location: Right Arm)   Pulse 77   Temp 99.1 F (37.3 C) (Oral)   Resp 18   Ht 5\' 4"  (1.626 m)   Wt 77.6 kg (171 lb)   SpO2 100%   BMI 29.35 kg/m   Physical Exam  Constitutional: She is oriented to person, place, and time. She appears well-developed and well-nourished. No distress.  HENT:  Head: Normocephalic and atraumatic.  Cardiovascular: Normal rate, regular rhythm and normal heart sounds.  No murmur heard. Pulmonary/Chest: Effort normal and breath sounds normal. No respiratory distress.  Abdominal: Soft. She exhibits no distension. There is no tenderness.  Musculoskeletal:  Bilateral lower extremities warm to the touch and erythematous. Decreased ROM. Tender to the touch - also has calf tenderness. 2+ distal pulses. Sensation intact.  Neurological: She is alert and oriented to person, place, and time.  Skin: Skin is warm and dry.  Nursing note and vitals reviewed.    ED Treatments / Results  Labs (all labs ordered are listed, but only abnormal results are displayed) Labs Reviewed  CBC WITH DIFFERENTIAL/PLATELET - Abnormal; Notable for the following components:      Result Value   RBC 3.60 (*)    Hemoglobin 11.5 (*)    HCT 35.5 (*)    All other components within normal limits  COMPREHENSIVE METABOLIC PANEL - Abnormal; Notable for the following components:   Albumin 3.4 (*)     All other components within normal limits    EKG  EKG Interpretation None       Radiology No results found.  Procedures Procedures (including critical care time)  Medications Ordered in ED Medications  morphine 4 MG/ML injection 4 mg (not administered)  vancomycin (VANCOCIN) IVPB 1000 mg/200 mL premix (not administered)  cefTRIAXone (ROCEPHIN) 1 g in dextrose 5 % 50 mL IVPB (not administered)     Initial Impression / Assessment and Plan / ED Course  I have reviewed the triage vital signs and the nursing notes.  Pertinent labs & imaging results that were available during my care of the patient were reviewed by me and considered in my medical decision making (see chart for details).  Kelly Chavez is a 71 y.o. female who presents to ED for lower extremity swelling and pain. Exam concerning for cellulitis. Afebrile, hemodynamically stable with normal white count. Hx of cancer with poor prognosis and has declined treatment. She lives at home and has been unable to ambulate due to pain. DVT study negative. Given IV pain medication along with vanc / rocephin. Hospitalist consulted who will admit.   Patient seen by and discussed with Dr. Venora Maples who agrees with treatment plan.    Final Clinical Impressions(s) / ED Diagnoses   Final diagnoses:  Leg swelling  Cellulitis of lower extremity, unspecified laterality    ED Discharge Orders    None       Kelly Chavez, Kelly Almond, PA-C 09/20/17 Marlana Latus    Jola Schmidt, MD 09/20/17 [phone]

## 2017-09-20 NOTE — Progress Notes (Signed)
Pharmacy Antibiotic Note  Kelly Chavez is a 71 y.o. female admitted on 09/20/2017 with non-purulent cellulitis.  Pharmacy has been consulted for cefazolin dosing.  Plan: Cefazolin 1 gr IV q8h  Monitor clinical course, renal function, cultures as available   Dosage will likely remain stable at above dose and need for further dosage adjustment appears unlikely at present.    Will sign off at this time.  Please reconsult if a change in clinical status warrants re-evaluation of dosage.     Height: 5\' 4"  (162.6 cm) Weight: 171 lb (77.6 kg) IBW/kg (Calculated) : 54.7  Temp (24hrs), Avg:99.1 F (37.3 C), Min:99.1 F (37.3 C), Max:99.1 F (37.3 C)  Recent Labs  Lab 09/20/17 1358  WBC 7.0  CREATININE 0.87    Estimated Creatinine Clearance: 60.7 mL/min (by C-G formula based on SCr of 0.87 mg/dL).    No Known Allergies  Antimicrobials this admission: 12/6 ceftriaxone x1  12/6 vancomycin x 1 12/6 cefazolin >>   Dose adjustments this admission: ---  Microbiology results: 12/6 BCx: sent   Thank you for allowing pharmacy to be a part of this patient's care.  Royetta Asal, PharmD, BCPS Pager [phone] 09/20/2017 6:19 PM

## 2017-09-20 NOTE — Progress Notes (Signed)
A consult was received from an ED physician for vancomycin per pharmacy dosing.  The patient's profile has been reviewed for ht/wt/allergies/indication/available labs.   A one time order has been placed for vancomycin.  Further antibiotics/pharmacy consults should be ordered by admitting physician if indicated.                       Thank you, Dolly Rias RPh 09/20/2017, 3:51 PM Pager [phone]

## 2017-09-20 NOTE — ED Notes (Signed)
Assigned 1601 @ 18:28 call report @ 18:48

## 2017-09-20 NOTE — H&P (Signed)
History and Physical    [name]:[id number] DOB: 11-06-1945 DOA: 09/20/2017  PCP: [name], MD  Patient coming from: Home  Chief Complaint: Leg pain, redness, swelling  HPI: Kelly Chavez is a 71 y.o. female with medical history significant of HIV followed by Dr. Baxter Flattery, anxiety, HTN, hx of breast cancer, presents with increased redness, pain, involving bilateral LE x 1 day prior to hospital visit. Denies fevers or chills. Does report increased generalized weakness. Pt does live by herself. Given increased weakness and LE pain and redness, pt presented to ED for further work up. Of note, patient has not tried oral abx for above symptoms  ED Course: In the ED, pt was found to be afebrile with no leukocytosis. Given concerns of increased weakness with developing cellulitis, hospitalist consulted for consideration for admission. Patient was given one dose of rocephin and vanc.  Review of Systems:  Review of Systems  Constitutional: Positive for malaise/fatigue. Negative for chills and fever.  HENT: Negative for congestion, ear pain, nosebleeds and tinnitus.   Eyes: Negative for double vision, photophobia and pain.  Respiratory: Negative for hemoptysis, sputum production, shortness of breath and wheezing.   Cardiovascular: Negative for palpitations, orthopnea and claudication.  Gastrointestinal: Negative for abdominal pain, constipation, nausea and vomiting.  Genitourinary: Negative for frequency, hematuria and urgency.  Musculoskeletal: Negative for back pain, joint pain and neck pain.  Neurological: Positive for weakness. Negative for tingling, tremors, seizures, loss of consciousness and headaches.  Psychiatric/Behavioral: Negative for hallucinations and memory loss. The patient is not nervous/anxious.     Past Medical History:  Diagnosis Date  . Anxiety   . Avascular necrosis of bones of both hips ([place])   . Brain tumor (benign) ([place])   . Cancer of sigmoid colon [facility])  2011   sigmoid colectomy   . Complication of anesthesia    senitive to all  . HIV (human immunodeficiency virus infection) ([place])   . Hypertension   . 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 Status post chemotherapy 2012  . Stroke [facility])     Past Surgical History:  Procedure Laterality Date  . ABDOMINAL HYSTERECTOMY    . COLON SURGERY     partial colectomy  . MASTECTOMY MODIFIED RADICAL Right 06/15/2014   Procedure: RIGHT MODIFIED RADICAL MASTECTOMY;  Surgeon: Shann Medal, MD;  Location: [place];  Service: General;  Laterality: Right;  . PORTACATH PLACEMENT N/A 06/15/2014   Procedure: INSERTION PORT-A-CATH LEFT SUBCLAVIAN;  Surgeon: Shann Medal, MD;  Location: [place];  Service: General;  Laterality: N/A;  . TONSILLECTOMY    . TOTAL ABDOMINAL HYSTERECTOMY W/ BILATERAL SALPINGOOPHORECTOMY  2012     reports that  has never smoked. she has never used smokeless tobacco. She reports that she does not drink alcohol or use drugs.  No Known Allergies  Family History  Problem Relation Age of Onset  . Diabetes Father   . Hyperlipidemia Father   . Stroke Father   . Hypertension Mother   . Hyperlipidemia Mother   . Dementia Mother   . Diabetes Sister   . Diabetes Brother   . Stroke Brother     Prior to Admission medications   Medication Sig Start Date End Date Taking? Authorizing Provider  atenolol (TENORMIN) 50 MG tablet TAKE 1 TABLET BY MOUTH EVERY DAY 05/28/17  Yes [name], MD  bictegravir-emtricitabine-tenofovir AF (BIKTARVY) 50-200-25 MG TABS tablet Take 1 tablet by mouth daily.  06/07/17  Yes [name], MD  Multiple Vitamins-Minerals (AIRBORNE PO) Take 1 tablet by mouth daily.   Yes [provider]  Turmeric 500 MG TABS Take 1 tablet by mouth daily.   Yes [provider]  aspirin EC 325 MG tablet Take 1 tablet (325 mg total) by mouth daily. Patient not taking: Reported on  02/20/2017 09/15/15   Modena Jansky, MD  lidocaine-prilocaine (EMLA) cream APPLY 1 APPLICATION TOPICALLY AS NEEDED. Patient not taking: Reported on 05/24/2017 04/01/15   Wyatt Portela, MD    Physical Exam: Vitals:   09/20/17 1428 09/20/17 1500 09/20/17 1600 09/20/17 1700  BP:  (!) 154/75 (!) 151/85 111/61  Pulse:  73 74 85  Resp:  16 17 17   Temp:      TempSrc:      SpO2:  100% 100% 96%  Weight: 77.6 kg (171 lb)     Height: 5\' 4"  (1.626 m)       Constitutional: NAD, calm, comfortable Vitals:   09/20/17 1428 09/20/17 1500 09/20/17 1600 09/20/17 1700  BP:  (!) 154/75 (!) 151/85 111/61  Pulse:  73 74 85  Resp:  16 17 17   Temp:      TempSrc:      SpO2:  100% 100% 96%  Weight: 77.6 kg (171 lb)     Height: 5\' 4"  (1.626 m)      Eyes: PERRL, lids and conjunctivae normal ENMT: Mucous membranes are moist. Posterior pharynx clear of any exudate or lesions.Normal dentition.  Neck: normal, supple, no masses, no thyromegaly Respiratory: clear to auscultation bilaterally, no wheezing, no crackles. Normal respiratory effort. No accessory muscle use.  Cardiovascular: Regular rate and rhythm, no murmurs / rubs / gallops. No extremity edema. 2+ pedal pulses. No carotid bruits.  Abdomen: no tenderness, no masses palpated. No hepatosplenomegaly. Bowel sounds positive.  Musculoskeletal: no clubbing / cyanosis. No joint deformity upper and lower extremities. Good ROM, no contractures. Normal muscle tone.  Skin: redness, swelling, tenderness over bilateral shins Neurologic: CN 2-12 grossly intact. Sensation intact, DTR normal. Strength 5/5 in all 4.  Psychiatric: Normal judgment and insight. Alert and oriented x 3. Normal mood.    Labs on Admission: I have personally reviewed following labs and imaging studies  CBC: Recent Labs  Lab 09/20/17 1358  WBC 7.0  NEUTROABS 4.7  HGB 11.5*  HCT 35.5*  MCV 98.6  PLT 992   Basic Metabolic Panel: Recent Labs  Lab 09/20/17 1358  NA 137  K 3.8    CL 104  CO2 25  GLUCOSE 88  BUN 10  CREATININE 0.87  CALCIUM 8.9   GFR: Estimated Creatinine Clearance: 60.7 mL/min (by C-G formula based on SCr of 0.87 mg/dL). Liver Function Tests: Recent Labs  Lab 09/20/17 1358  AST 27  ALT 26  ALKPHOS 106  BILITOT 0.8  PROT 7.3  ALBUMIN 3.4*   No results for input(s): LIPASE, AMYLASE in the last 168 hours. No results for input(s): AMMONIA in the last 168 hours. Coagulation Profile: No results for input(s): INR, PROTIME in the last 168 hours. Cardiac Enzymes: No results for input(s): CKTOTAL, CKMB, CKMBINDEX, TROPONINI in the last 168 hours. BNP (last 3 results) No results for input(s): PROBNP in the last 8760 hours. HbA1C: No results for input(s): HGBA1C in the last 72 hours. CBG: No results for input(s): GLUCAP in the last 168 hours. Lipid Profile: No results for input(s): CHOL, HDL, LDLCALC, TRIG, CHOLHDL, LDLDIRECT in the last 72 hours. Thyroid Function  Tests: No results for input(s): TSH, T4TOTAL, FREET4, T3FREE, THYROIDAB in the last 72 hours. Anemia Panel: No results for input(s): VITAMINB12, FOLATE, FERRITIN, TIBC, IRON, RETICCTPCT in the last 72 hours. Urine analysis:    Component Value Date/Time   COLORURINE YELLOW 09/13/2015 1600   APPEARANCEUR CLEAR 09/13/2015 1600   LABSPEC 1.023 09/13/2015 1600   PHURINE 6.0 09/13/2015 1600   GLUCOSEU NEGATIVE 09/13/2015 1600   HGBUR NEGATIVE 09/13/2015 1600   BILIRUBINUR NEGATIVE 09/13/2015 1600   KETONESUR 15 (A) 09/13/2015 1600   PROTEINUR NEGATIVE 09/13/2015 1600   UROBILINOGEN 0.2 01/22/2014 1459   NITRITE NEGATIVE 09/13/2015 1600   LEUKOCYTESUR NEGATIVE 09/13/2015 1600   Sepsis Labs: !!!!!!!!!!!!!!!!!!!!!!!!!!!!!!!!!!!!!!!!!!!! @LABRCNTIP (procalcitonin:4,lacticidven:4) )No results found for this or any previous visit (from the past 240 hour(s)).   Radiological Exams on Admission: Dg Ankle Complete Left  Result Date: 09/20/2017 CLINICAL DATA:  Soft tissue swelling  and redness of the left leg, pain EXAM: LEFT ANKLE COMPLETE - 3+ VIEW COMPARISON:  None. FINDINGS: The ankle joint appears normal. Alignment is normal. No fracture is seen. There is mild soft tissue swelling about the left ankle. IMPRESSION: Mild soft tissue swelling.  No bony abnormality. Electronically Signed   By: Ivar Drape M.D.   On: 09/20/2017 16:52    Assessment/Plan Principal Problem:   Bilateral lower leg cellulitis Active Problems:   HIV disease ([place])   Essential hypertension, benign   Breast cancer of upper-inner quadrant of right female breast ([place])   Paranoia ([place])   Hyperlipidemia   Weakness   1. Bilateral LE cellulitis 1. Patient with B LE erythema and pain x 1 day prior to this hospital visit 2. Discussed case with patient's ID specialist, Dr. Baxter Flattery, who recommends short course of IV abx 3. Will start patient on IV ancef for moderate nonpurulent cellulitis 4. Afebrile. No leukocytosis 5. Recheck CBC in AM 2. HIV 1. Followed by Dr. Baxter Flattery 2. Outpatient records reviewed. Patient with most recent CD4 of 340 and viral load of <20 in 11/18 3. Continue antiviral therapy for now 3. HTN 1. BP stable at this time 2. Continue home regimen as tolerated 4. Hx breast cancer 1. Patient had historically declined further treatment 5. HLD 1. Cont home regimen 2. Stable 6. Weakness 1. Pt lives alone 2. Markedly weak, likely worse secondary to acute infection 3. Will consult PT/OT  DVT prophylaxis: heparin subq  Code Status: Full Family Communication: Pt in room, family at bedside  Disposition Plan: Uncertain at this time  Consults called:  Admission status: Observation as would likely require less than 2 midnight stay for IV abx   Marylu Lund MD [facility] Pager [phone]  If 7PM-7AM, please contact night-coverage [email or web address] Password [email or web address]  09/20/2017, 6:02 PM

## 2017-09-21 ENCOUNTER — Other Ambulatory Visit: Payer: Self-pay

## 2017-09-21 DIAGNOSIS — L03115 Cellulitis of right lower limb: Secondary | ICD-10-CM | POA: Diagnosis present

## 2017-09-21 DIAGNOSIS — C50211 Malignant neoplasm of upper-inner quadrant of right female breast: Secondary | ICD-10-CM

## 2017-09-21 DIAGNOSIS — L03116 Cellulitis of left lower limb: Secondary | ICD-10-CM | POA: Diagnosis present

## 2017-09-21 DIAGNOSIS — Z7982 Long term (current) use of aspirin: Secondary | ICD-10-CM | POA: Diagnosis not present

## 2017-09-21 DIAGNOSIS — Z90710 Acquired absence of both cervix and uterus: Secondary | ICD-10-CM | POA: Diagnosis not present

## 2017-09-21 DIAGNOSIS — I10 Essential (primary) hypertension: Secondary | ICD-10-CM | POA: Diagnosis present

## 2017-09-21 DIAGNOSIS — Z833 Family history of diabetes mellitus: Secondary | ICD-10-CM | POA: Diagnosis not present

## 2017-09-21 DIAGNOSIS — G8110 Spastic hemiplegia affecting unspecified side: Secondary | ICD-10-CM

## 2017-09-21 DIAGNOSIS — Z8673 Personal history of transient ischemic attack (TIA), and cerebral infarction without residual deficits: Secondary | ICD-10-CM | POA: Diagnosis not present

## 2017-09-21 DIAGNOSIS — Z823 Family history of stroke: Secondary | ICD-10-CM | POA: Diagnosis not present

## 2017-09-21 DIAGNOSIS — E7849 Other hyperlipidemia: Secondary | ICD-10-CM | POA: Diagnosis not present

## 2017-09-21 DIAGNOSIS — L03119 Cellulitis of unspecified part of limb: Secondary | ICD-10-CM | POA: Diagnosis not present

## 2017-09-21 DIAGNOSIS — Z9221 Personal history of antineoplastic chemotherapy: Secondary | ICD-10-CM | POA: Diagnosis not present

## 2017-09-21 DIAGNOSIS — F22 Delusional disorders: Secondary | ICD-10-CM

## 2017-09-21 DIAGNOSIS — M7989 Other specified soft tissue disorders: Secondary | ICD-10-CM | POA: Diagnosis not present

## 2017-09-21 DIAGNOSIS — Z85038 Personal history of other malignant neoplasm of large intestine: Secondary | ICD-10-CM | POA: Diagnosis not present

## 2017-09-21 DIAGNOSIS — Z8249 Family history of ischemic heart disease and other diseases of the circulatory system: Secondary | ICD-10-CM | POA: Diagnosis not present

## 2017-09-21 DIAGNOSIS — Z9011 Acquired absence of right breast and nipple: Secondary | ICD-10-CM | POA: Diagnosis not present

## 2017-09-21 DIAGNOSIS — Z79899 Other long term (current) drug therapy: Secondary | ICD-10-CM | POA: Diagnosis not present

## 2017-09-21 DIAGNOSIS — R531 Weakness: Secondary | ICD-10-CM | POA: Diagnosis present

## 2017-09-21 DIAGNOSIS — E785 Hyperlipidemia, unspecified: Secondary | ICD-10-CM | POA: Diagnosis present

## 2017-09-21 DIAGNOSIS — Z8349 Family history of other endocrine, nutritional and metabolic diseases: Secondary | ICD-10-CM | POA: Diagnosis not present

## 2017-09-21 DIAGNOSIS — F419 Anxiety disorder, unspecified: Secondary | ICD-10-CM | POA: Diagnosis present

## 2017-09-21 DIAGNOSIS — Z853 Personal history of malignant neoplasm of breast: Secondary | ICD-10-CM | POA: Diagnosis not present

## 2017-09-21 DIAGNOSIS — B20 Human immunodeficiency virus [HIV] disease: Secondary | ICD-10-CM | POA: Diagnosis present

## 2017-09-21 LAB — COMPREHENSIVE METABOLIC PANEL
ALBUMIN: 2.8 g/dL — AB (ref 3.5–5.0)
ALT: 20 U/L (ref 14–54)
ANION GAP: 7 (ref 5–15)
AST: 21 U/L (ref 15–41)
Alkaline Phosphatase: 86 U/L (ref 38–126)
BILIRUBIN TOTAL: 0.4 mg/dL (ref 0.3–1.2)
BUN: 10 mg/dL (ref 6–20)
CO2: 25 mmol/L (ref 22–32)
Calcium: 8.7 mg/dL — ABNORMAL LOW (ref 8.9–10.3)
Chloride: 108 mmol/L (ref 101–111)
Creatinine, Ser: 0.97 mg/dL (ref 0.44–1.00)
GFR calc Af Amer: >60 mL/min (ref >60)
GFR, EST NON AFRICAN AMERICAN: 58 mL/min — AB (ref >60)
Glucose, Bld: 91 mg/dL (ref 65–99)
POTASSIUM: 3.7 mmol/L (ref 3.5–5.1)
Sodium: 140 mmol/L (ref 135–145)
TOTAL PROTEIN: 6.2 g/dL — AB (ref 6.5–8.1)

## 2017-09-21 LAB — CBC
HEMATOCRIT: 33.7 % — AB (ref 36.0–46.0)
HEMOGLOBIN: 10.6 g/dL — AB (ref 12.0–15.0)
MCH: 31.4 pg (ref 26.0–34.0)
MCHC: 31.5 g/dL (ref 30.0–36.0)
MCV: 99.7 fL (ref 78.0–100.0)
Platelets: 203 10*3/uL (ref 150–400)
RBC: 3.38 MIL/uL — ABNORMAL LOW (ref 3.87–5.11)
RDW: 13.8 % (ref 11.5–15.5)
WBC: 5.3 10*3/uL (ref 4.0–10.5)

## 2017-09-21 MED ORDER — SODIUM CHLORIDE 0.9% FLUSH
10.0000 mL | INTRAVENOUS | Status: DC | PRN
Start: 2017-09-21 — End: 2017-09-22
  Administered 2017-09-22: 03:00:00 10 mL
  Filled 2017-09-21: qty 40

## 2017-09-21 NOTE — Progress Notes (Signed)
Report received from [facility], [place], received to room via stretcher and transferred to bed without difficulty. Education initiated on safety and use of call bell, pt verbalized understanding

## 2017-09-21 NOTE — Evaluation (Signed)
Physical Therapy Evaluation Patient Details Name: Kelly Chavez MRN: [id number] DOB: 03-05-1946 Today's Date: 09/21/2017   History of Present Illness  71 y.o. female with medical history significant of HIV followed by Dr. 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 Pt currently with functional limitations due to the deficits listed below (see PT Problem List).  Pt will benefit from skilled PT to increase their independence and safety with mobility to allow discharge to the venue listed below.  Pt requiring increased time to mobilize and only able to tolerate a few feet of ambulation due to reported LE pain.  Pt lives alone and reports not functioning well at home prior to admission.  Recommend SNF upon d/c.     Follow Up Recommendations SNF    Equipment Recommendations  None recommended by PT    Recommendations for Other Services       Precautions / Restrictions Precautions Precautions: Fall Restrictions Weight Bearing Restrictions: No      Mobility  Bed Mobility               General bed mobility comments: pt up on BSC upon entering room  Transfers Overall transfer level: Needs assistance Equipment used: Rolling walker (2 wheeled) Transfers: Sit to/from Stand Sit to Stand: Min assist Stand pivot transfers: Min assist       General transfer comment: verbal cues for hand placement and safe technique, very slow transfers  Ambulation/Gait Ambulation/Gait assistance: Min guard Ambulation Distance (Feet): 6 Feet Assistive device: Rolling walker (2 wheeled) Gait Pattern/deviations: Step-through pattern;Decreased stride length;Wide base of support     General Gait Details: very slow ambulation, wide BOS likely due to bil AVN, pt reports distance limited due to LE pain  Stairs             Wheelchair Mobility    Modified Rankin (Stroke Patients Only)       Balance                                             Pertinent Vitals/Pain Pain Assessment: 0-10 Pain Score: 8  Pain Location: bil lower legs, L more then R Pain Descriptors / Indicators: Aching Pain Intervention(s): Limited activity within patient's tolerance;Monitored during session;Repositioned    Home Living Family/patient expects to be discharged to:: Private residence Living Arrangements: Alone   Type of Home: Apartment Home Access: Level entry     Home Layout: One level Home Equipment: Environmental consultant - 4 wheels      Prior Function Level of Independence: Independent with assistive device(s)         Comments: Patient reports she drives to get groceries and cooks her own meals but it's getting more difficult to care for herself     Hand Dominance        Extremity/Trunk Assessment   Upper Extremity Assessment Upper Extremity Assessment: RUE deficits/detail;LUE deficits/detail RUE Deficits / Details: AROM limited to 80 degrees flexion/abduction, c/o pain, AROM elbow through hand WFL, generalized weakness LUE Deficits / Details: AROM limited to 90 degrees flexion/abduction, AROM elbow through hand WFL, generalized weakness    Lower Extremity Assessment Lower Extremity Assessment: Generalized weakness(hx of AVN bil hips)       Communication   Communication:  No difficulties  Cognition Arousal/Alertness: Awake/alert Behavior During Therapy: Flat affect Overall Cognitive Status: Within Functional Limits for tasks assessed                                        General Comments General comments (skin integrity, edema, etc.): c/o lower legs and feet being itchy    Exercises     Assessment/Plan    PT Assessment Patient needs continued PT services  PT Problem List Decreased strength;Decreased mobility;Pain;Decreased activity tolerance       PT  Treatment Interventions Gait training;Therapeutic activities;Therapeutic exercise;DME instruction;Functional mobility training;Patient/family education;Stair training    PT Goals (Current goals can be found in the Care Plan section)  Acute Rehab PT Goals Patient Stated Goal: less pain PT Goal Formulation: With patient Time For Goal Achievement: 10/05/17 Potential to Achieve Goals: Good    Frequency Min 3X/week   Barriers to discharge        Co-evaluation               AM-PAC PT "6 Clicks" Daily Activity  Outcome Measure Difficulty turning over in bed (including adjusting bedclothes, sheets and blankets)?: A Little Difficulty moving from lying on back to sitting on the side of the bed? : A Little Difficulty sitting down on and standing up from a chair with arms (e.g., wheelchair, bedside commode, etc,.)?: Unable Help needed moving to and from a bed to chair (including a wheelchair)?: A Lot Help needed walking in hospital room?: A Lot Help needed climbing 3-5 steps with a railing? : Total 6 Click Score: 12    End of Session   Activity Tolerance: Patient limited by pain Patient left: in chair;Other (comment)(with OT) Nurse Communication: Mobility status PT Visit Diagnosis: Difficulty in walking, not elsewhere classified (R26.2)    Time: 8768-1157 PT Time Calculation (min) (ACUTE ONLY): 14 min   Charges:   PT Evaluation $PT Eval Low Complexity: 1 Low     PT G Codes:   PT G-Codes **NOT FOR INPATIENT CLASS** Functional Assessment Tool Used: AM-PAC 6 Clicks Basic Mobility;Clinical judgement Functional Limitation: Mobility: Walking and moving around Mobility: Walking and Moving Around Current Status (W6203): At least 40 percent but less than 60 percent impaired, limited or restricted Mobility: Walking and Moving Around Goal Status [phone]): At least 1 percent but less than 20 percent impaired, limited or restricted    Carmelia Bake, PT, DPT 09/21/2017 Pager:  [phone]   York Ram E 09/21/2017, 12:10 PM

## 2017-09-21 NOTE — Progress Notes (Signed)
PROGRESS NOTE  Trenese Haft WUJ:[id number] DOB: 1946/10/11 DOA: 09/20/2017 PCP: [name], MD  HPI/Recap of past 24 hours: Vernadine Coombs is a 71 y.o. female with medical history significant of HIV followed by Dr. Baxter Flattery, anxiety, HTN, hx of breast cancer, presents with increased redness, pain, involving bilateral LE x 1 day prior to hospital visit. Denies fevers or chills. Does report increased generalized weakness. Pt does live by herself. Given increased weakness and LE pain and redness, pt presented to ED for further work up. Of note, patient has not tried oral abx for above symptoms. B/L LE cellulitis. On IV ancef. Pain is improving but persistent on the left lower extremity.    Assessment/Plan: Principal Problem:   Bilateral lower leg cellulitis Active Problems:   HIV disease ([place])   Essential hypertension, benign   Breast cancer of upper-inner quadrant of right female breast ([place])   Paranoia ([place])   Hyperlipidemia   Weakness  1. Bilateral LE cellulitis 1. Patient with B LE erythema and pain x 1 day prior to this hospital visit 2. Dr. Baxter Flattery recommends short course of IV abx 3.  IV ancef day 1 for moderate nonpurulent cellulitis 4. Afebrile. No leukocytosis 5. CBC in AM 2. HIV 1. Followed by Dr. Baxter Flattery 2. Outpatient records reviewed. Patient with most recent CD4 of 340 and viral load of <20 in 11/18 3. Continue antiviral therapy for now 3. HTN 1. BP stable at this time 2. Continue home regimen as tolerated 4. Hx breast cancer 1. Patient had historically declined further treatment 5. HLD 1. Cont home regimen 2. Stable 6. Weakness 1. Pt lives alone 2. Markedly weak, likely worse secondary to acute infection 3.  PT/OT   Code Status: full  Family Communication: no family member at bedside.  Disposition Plan: will stay another midnight to continue IV antibiotic.   Consultants:  ID  Procedures:  None  Antimicrobials:  IV ancef day 1  DVT  prophylaxis:  hep 5000 u sq TID   Objective: Vitals:   09/20/17 1931 09/20/17 2017 09/21/17 0502 09/21/17 0830  BP: 119/66 (!) 107/57 (!) 118/53 119/61  Pulse: 89 81 72 69  Resp: 18 18 18    Temp:  99 F (37.2 C) 98.9 F (37.2 C)   TempSrc:  Oral Axillary   SpO2: 97% 96% 97%   Weight:      Height:        Intake/Output Summary (Last 24 hours) at 09/21/2017 1136 Last data filed at 09/21/2017 1025 Gross per 24 hour  Intake 340 ml  Output 725 ml  Net -385 ml   Filed Weights   09/20/17 1428  Weight: 77.6 kg (171 lb)    Exam:  Eyes: PERRL, lids and conjunctivae normal ENMT: Mucous membranes are moist. Posterior pharynx clear of any exudate or lesions.Normal dentition.  Neck: normal, supple, no masses, no thyromegaly Respiratory: clear to auscultation bilaterally, no wheezing, no crackles. Normal respiratory effort. No accessory muscle use.  Cardiovascular: Regular rate and rhythm, no murmurs / rubs / gallops. No extremity edema. 2+ pedal pulses. No carotid bruits.  Abdomen: no tenderness, no masses palpated. No hepatosplenomegaly. Bowel sounds positive.  Musculoskeletal: no clubbing / cyanosis. No joint deformity upper and lower extremities. Good ROM, no contractures. Normal muscle tone.  Skin: redness, swelling, tenderness over bilateral shins Neurologic: CN 2-12 grossly intact. Sensation intact, DTR normal. Strength 5/5 in all 4.  Psychiatric: Normal judgment and insight. Alert and oriented x 3. Normal mood.    Data  Reviewed: CBC: Recent Labs  Lab 09/20/17 1358 09/21/17 0706  WBC 7.0 5.3  NEUTROABS 4.7  --   HGB 11.5* 10.6*  HCT 35.5* 33.7*  MCV 98.6 99.7  PLT 213 712   Basic Metabolic Panel: Recent Labs  Lab 09/20/17 1358 09/21/17 0706  NA 137 140  K 3.8 3.7  CL 104 108  CO2 25 25  GLUCOSE 88 91  BUN 10 10  CREATININE 0.87 0.97  CALCIUM 8.9 8.7*   GFR: Estimated Creatinine Clearance: 54.4 mL/min (by C-G formula based on SCr of 0.97 mg/dL). Liver  Function Tests: Recent Labs  Lab 09/20/17 1358 09/21/17 0706  AST 27 21  ALT 26 20  ALKPHOS 106 86  BILITOT 0.8 0.4  PROT 7.3 6.2*  ALBUMIN 3.4* 2.8*   No results for input(s): LIPASE, AMYLASE in the last 168 hours. No results for input(s): AMMONIA in the last 168 hours. Coagulation Profile: No results for input(s): INR, PROTIME in the last 168 hours. Cardiac Enzymes: No results for input(s): CKTOTAL, CKMB, CKMBINDEX, TROPONINI in the last 168 hours. BNP (last 3 results) No results for input(s): PROBNP in the last 8760 hours. HbA1C: No results for input(s): HGBA1C in the last 72 hours. CBG: No results for input(s): GLUCAP in the last 168 hours. Lipid Profile: No results for input(s): CHOL, HDL, LDLCALC, TRIG, CHOLHDL, LDLDIRECT in the last 72 hours. Thyroid Function Tests: No results for input(s): TSH, T4TOTAL, FREET4, T3FREE, THYROIDAB in the last 72 hours. Anemia Panel: No results for input(s): VITAMINB12, FOLATE, FERRITIN, TIBC, IRON, RETICCTPCT in the last 72 hours. Urine analysis:    Component Value Date/Time   COLORURINE YELLOW 09/13/2015 1600   APPEARANCEUR CLEAR 09/13/2015 1600   LABSPEC 1.023 09/13/2015 1600   PHURINE 6.0 09/13/2015 1600   GLUCOSEU NEGATIVE 09/13/2015 1600   HGBUR NEGATIVE 09/13/2015 1600   BILIRUBINUR NEGATIVE 09/13/2015 1600   KETONESUR 15 (A) 09/13/2015 1600   PROTEINUR NEGATIVE 09/13/2015 1600   UROBILINOGEN 0.2 01/22/2014 1459   NITRITE NEGATIVE 09/13/2015 1600   LEUKOCYTESUR NEGATIVE 09/13/2015 1600   Sepsis Labs: @LABRCNTIP (procalcitonin:4,lacticidven:4)  )No results found for this or any previous visit (from the past 240 hour(s)).    Studies: Dg Ankle Complete Left  Result Date: 09/20/2017 CLINICAL DATA:  Soft tissue swelling and redness of the left leg, pain EXAM: LEFT ANKLE COMPLETE - 3+ VIEW COMPARISON:  None. FINDINGS: The ankle joint appears normal. Alignment is normal. No fracture is seen. There is mild soft tissue  swelling about the left ankle. IMPRESSION: Mild soft tissue swelling.  No bony abnormality. Electronically Signed   By: Ivar Drape M.D.   On: 09/20/2017 16:52    Scheduled Meds: . atenolol  50 mg Oral Daily  . baclofen  5 mg Oral BID  . bictegravir-emtricitabine-tenofovir AF  1 tablet Oral Daily  . heparin  5,000 Units Subcutaneous Q8H    Continuous Infusions: .  ceFAZolin (ANCEF) IV Stopped (09/21/17 0728)     LOS: 0 days     Kayleen Memos, MD [facility] Pager [phone]  If 7PM-7AM, please contact night-coverage [email or web address] Password [email or web address] 09/21/2017, 11:36 AM

## 2017-09-21 NOTE — Progress Notes (Signed)
Occupational Therapy Evaluation Patient Details Name: Kelly Chavez MRN: [id number] DOB: 1946-10-12 Today's Date: 09/21/2017    History of Present Illness 71 y.o. female with medical history significant of HIV followed by Dr. 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 She will benefit from skilled OT to maximize function and to facilitate a safe discharge. OT will follow.    Follow Up Recommendations  SNF;Supervision/Assistance - 24 hour    Equipment Recommendations  3 in 1 bedside commode    Recommendations for Other Services PT consult     Precautions / Restrictions Precautions Precautions: Fall Restrictions Weight Bearing Restrictions: No      Mobility Bed Mobility               General bed mobility comments: NT -- up with PT upon arrival  Transfers Overall transfer level: Needs assistance Equipment used: Rolling walker (2 wheeled) Transfers: Sit to/from [facility] Sit to Stand: Min assist;Mod assist Stand pivot transfers: Min assist       General transfer comment: needs cues for technique    Balance                                           ADL either performed or assessed with clinical judgement   ADL Overall ADL's : Needs assistance/impaired Eating/Feeding: Independent;Sitting   Grooming: Set up;Sitting       Lower Body Bathing: Total assistance;Sitting/lateral leans Lower Body Bathing Details (indicate cue type and reason): pt requested OT wash legs and feet and apply lotion     Lower Body Dressing: Total assistance Lower Body Dressing Details (indicate cue type and reason): don/doff socks Toilet Transfer: Stand-pivot;Minimal assistance;Moderate assistance;Cueing for  safety;BSC;RW           Functional mobility during ADLs: Minimal assistance;Moderate assistance;Rolling walker General ADL Comments: Upon arrival, patient had just gotten off of BSC with PT and was transferring to recliner. Assisted patient with transfer to recliner. Evaluation performed. Patient c/o pain in RUE and BLE. Patient reports her son calls her daily but that she has no one to assist her at home.      Vision         Perception     Praxis      Pertinent Vitals/Pain Pain Assessment: 0-10 Pain Score: 8  Pain Location: bil lower legs, L more then R Pain Descriptors / Indicators: Aching Pain Intervention(s): Limited activity within patient's tolerance;Monitored during session;Repositioned     Hand Dominance     Extremity/Trunk Assessment Upper Extremity Assessment Upper Extremity Assessment: RUE deficits/detail;LUE deficits/detail RUE Deficits / Details: AROM limited to 80 degrees flexion/abduction, c/o pain, AROM elbow through hand WFL, generalized weakness LUE Deficits / Details: AROM limited to 90 degrees flexion/abduction, AROM elbow through hand WFL, generalized weakness   Lower Extremity Assessment Lower Extremity Assessment: Generalized weakness(hx of AVN bil hips)       Communication Communication Communication: No difficulties   Cognition Arousal/Alertness: Awake/alert Behavior During Therapy: Flat affect Overall Cognitive Status: Within Functional Limits for tasks assessed  General Comments  c/o lower legs and feet being itchy    Exercises     Shoulder Instructions      Home Living Family/patient expects to be discharged to:: Private residence Living Arrangements: Alone   Type of Home: Apartment Home Access: Level entry     Home Layout: One level     Bathroom Shower/Tub: Occupational psychologist: Handicapped height     Home Equipment: Environmental consultant - 4 wheels          Prior  Functioning/Environment Level of Independence: Independent with assistive device(s)        Comments: Patient reports she drives to get groceries and cooks her own meals but it's getting more difficult to care for herself        OT Problem List: Decreased strength;Decreased activity tolerance;Impaired balance (sitting and/or standing);Decreased knowledge of use of DME or AE;Decreased safety awareness;Pain      OT Treatment/Interventions: Self-care/ADL training;Therapeutic exercise;DME and/or AE instruction;Therapeutic activities;Balance training    OT Goals(Current goals can be found in the care plan section) Acute Rehab OT Goals Patient Stated Goal: less pain OT Goal Formulation: With patient Time For Goal Achievement: 10/05/17 Potential to Achieve Goals: Good ADL Goals Pt Will Perform Upper Body Bathing: with set-up;sitting Pt Will Perform Lower Body Bathing: with min assist;with adaptive equipment;sit to/from stand Pt Will Perform Upper Body Dressing: with set-up;sitting Pt Will Perform Lower Body Dressing: with min assist;sit to/from stand;with adaptive equipment Pt Will Transfer to Toilet: with supervision;ambulating;bedside commode Pt Will Perform Toileting - Clothing Manipulation and hygiene: with supervision;sit to/from stand  OT Frequency: Min 2X/week   Barriers to D/C: Decreased caregiver support          Co-evaluation              AM-PAC PT "6 Clicks" Daily Activity     Outcome Measure Help from another person eating meals?: None Help from another person taking care of personal grooming?: A Little Help from another person toileting, which includes using toliet, bedpan, or urinal?: A Lot Help from another person bathing (including washing, rinsing, drying)?: A Lot Help from another person to put on and taking off regular upper body clothing?: A Little Help from another person to put on and taking off regular lower body clothing?: A Lot 6 Click Score: 16    End of Session Equipment Utilized During Treatment: Surveyor, mining Communication: Mobility status  Activity Tolerance: Patient tolerated treatment well Patient left: in chair;with call bell/phone within reach;with chair alarm set  OT Visit Diagnosis: Unsteadiness on feet (R26.81);Muscle weakness (generalized) (M62.81)                Time: 1033-1050 OT Time Calculation (min): 17 min Charges:  OT General Charges $OT Visit: 1 Visit OT Evaluation $OT Eval Moderate Complexity: 1 Mod G-Codes: OT G-codes **NOT FOR INPATIENT CLASS** Functional Assessment Tool Used: Clinical judgement Functional Limitation: Self care Self Care Current Status (C1660): At least 60 percent but less than 80 percent impaired, limited or restricted Self Care Goal Status (Y3016): At least 20 percent but less than 40 percent impaired, limited or restricted    Ardelia Wrede A Devinn Voshell 09/21/2017, 12:00 PM

## 2017-09-21 NOTE — Progress Notes (Signed)
Patient and son decline SNF services. Case Manager notified.   Kathrin Greathouse, Latanya Presser, MSW Clinical Social Worker  [phone] 09/21/2017  5:01 PM

## 2017-09-21 NOTE — Progress Notes (Signed)
Spoke with patient and son at bedside. Per CSW they have declined SNF and have requested [facility] services. Discussed with patient and son what insurance covers and what it does not. [facility] chose [facility] for [facility] services. Has DME in place. Awaiting final orders. [phone]

## 2017-09-21 NOTE — Plan of Care (Signed)
,,,

## 2017-09-22 DIAGNOSIS — L03115 Cellulitis of right lower limb: Principal | ICD-10-CM

## 2017-09-22 DIAGNOSIS — L03116 Cellulitis of left lower limb: Secondary | ICD-10-CM

## 2017-09-22 LAB — CBC
HEMATOCRIT: 34.1 % — AB (ref 36.0–46.0)
HEMOGLOBIN: 10.7 g/dL — AB (ref 12.0–15.0)
MCH: 31.6 pg (ref 26.0–34.0)
MCHC: 31.4 g/dL (ref 30.0–36.0)
MCV: 100.6 fL — AB (ref 78.0–100.0)
Platelets: 208 10*3/uL (ref 150–400)
RBC: 3.39 MIL/uL — ABNORMAL LOW (ref 3.87–5.11)
RDW: 14.1 % (ref 11.5–15.5)
WBC: 6.1 10*3/uL (ref 4.0–10.5)

## 2017-09-22 LAB — BASIC METABOLIC PANEL
Anion gap: 6 (ref 5–15)
BUN: 16 mg/dL (ref 6–20)
CHLORIDE: 108 mmol/L (ref 101–111)
CO2: 25 mmol/L (ref 22–32)
CREATININE: 0.91 mg/dL (ref 0.44–1.00)
Calcium: 8.4 mg/dL — ABNORMAL LOW (ref 8.9–10.3)
GFR calc Af Amer: >60 mL/min (ref >60)
GFR calc non Af Amer: >60 mL/min (ref >60)
GLUCOSE: 85 mg/dL (ref 65–99)
POTASSIUM: 3.9 mmol/L (ref 3.5–5.1)
SODIUM: 139 mmol/L (ref 135–145)

## 2017-09-22 MED ORDER — HEPARIN SOD (PORK) LOCK FLUSH 100 UNIT/ML IV SOLN: 500.0000 [IU] | INTRAVENOUS | Status: DC | PRN

## 2017-09-22 MED ORDER — CLINDAMYCIN HCL 300 MG PO CAPS: 300.0000 mg | ORAL_CAPSULE | Freq: Three times a day (TID) | ORAL | 0 refills | Status: AC

## 2017-09-22 MED ORDER — ACETAMINOPHEN 325 MG PO TABS: 650.0000 mg | ORAL_TABLET | Freq: Four times a day (QID) | ORAL | 0 refills | Status: AC | PRN

## 2017-09-22 NOTE — Progress Notes (Signed)
Occupational Therapy Treatment Patient Details Name: Kelly Chavez MRN: [id number] DOB: Jul 20, 1946 Today's Date: 09/22/2017    History of present illness 71 y.o. female with medical history significant of HIV followed by Dr. 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 She is normally independent at home. Patient with heavy posterior lean in sitting and has decreased control stand to sit, often "plopping" into the chair. High risk for falls. Continue OT per plan of care.   Follow Up Recommendations  SNF;Supervision/Assistance - 24 hour (HHOT if patient refuses SNF)   Equipment Recommendations  3 in 1 bedside commode    Recommendations for Other Services      Precautions / Restrictions Precautions Precautions: Fall Restrictions Weight Bearing Restrictions: No       Mobility Bed Mobility Overal bed mobility: Needs Assistance Bed Mobility: Supine to Sit     Supine to sit: Min assist;Mod assist     General bed mobility comments: assistance with trunk  Transfers Overall transfer level: Needs assistance Equipment used: Rolling walker (2 wheeled) Transfers: Sit to/from [facility] Sit to Stand: Min assist Stand pivot transfers: Min assist       General transfer comment: verbal cues for hand placement and safe technique, very slow transfers    Balance                                           ADL either performed or assessed with clinical judgement   ADL Overall ADL's : Needs assistance/impaired Eating/Feeding: Independent;Sitting   Grooming: Wash/dry hands;Wash/dry face;Set up;Sitting   Upper Body Bathing: Set up;Sitting   Lower Body Bathing: Moderate assistance;Sit to/from stand   Upper Body Dressing : Set up;Sitting   Lower  Body Dressing: Total assistance   Toilet Transfer: Minimal assistance;Stand-pivot;BSC;RW   Toileting- Clothing Manipulation and Hygiene: Minimal assistance;Sit to/from stand       Functional mobility during ADLs: Minimal assistance;Rolling walker General ADL Comments: Patient agreeable to OT. Bed mobility, then transfer to [facility]. Bathed while sitting on BSC. Completed toilet hygiene and changed gown. Patient up to recliner at end of session. Patient with heavy posterior lean in sitting.      Vision       Perception     Praxis      Cognition Arousal/Alertness: Awake/alert Behavior During Therapy: Flat affect Overall Cognitive Status: Within Functional Limits for tasks assessed                                          Exercises     Shoulder Instructions       General Comments      Pertinent Vitals/ Pain       Pain Assessment: 0-10 Pain Score: 6  Pain Location: bil lower legs, L more then R Pain Descriptors / Indicators: Aching;Sore Pain Intervention(s): Limited activity within patient's tolerance;Monitored during session;Repositioned  Home Living  Prior Functioning/Environment              Frequency  Min 2X/week        Progress Toward Goals  OT Goals(current goals can now be found in the care plan section)  Progress towards OT goals: Progressing toward goals  Acute Rehab OT Goals Patient Stated Goal: to go home  Plan Discharge plan remains appropriate    Co-evaluation                 AM-PAC PT "6 Clicks" Daily Activity     Outcome Measure   Help from another person eating meals?: None Help from another person taking care of personal grooming?: A Little Help from another person toileting, which includes using toliet, bedpan, or urinal?: A Little Help from another person bathing (including washing, rinsing, drying)?: A Lot Help from another person to put on and  taking off regular upper body clothing?: None Help from another person to put on and taking off regular lower body clothing?: A Lot 6 Click Score: 18    End of Session Equipment Utilized During Treatment: Rolling walker  OT Visit Diagnosis: Unsteadiness on feet (R26.81);Muscle weakness (generalized) (M62.81)   Activity Tolerance Patient tolerated treatment well   Patient Left in chair;with call bell/phone within reach;with chair alarm set;with nursing/sitter in room   Nurse Communication Mobility status        Time: 9622-2979 OT Time Calculation (min): 34 min  Charges: OT General Charges $OT Visit: 1 Visit OT Treatments $Self Care/Home Management : 23-37 mins    Waymon Laser A Twila Rappa 09/22/2017, 10:15 AM

## 2017-09-22 NOTE — Discharge Summary (Signed)
Physician Discharge Summary  Kelly Chavez OEV:[id number] DOB: 01-16-1946 DOA: 09/20/2017  PCP: [name], MD  Admit date: 09/20/2017 Discharge date: 09/22/2017  Recommendations for Outpatient Follow-up:  1. Continue clindamycin for 10 days on discharge   Discharge Diagnoses:  Principal Problem:   Bilateral lower leg cellulitis Active Problems:   HIV disease ([place])   Essential hypertension, benign   Breast cancer of upper-inner quadrant of right female breast ([place])   Paranoia ([place])   Hyperlipidemia   Weakness    Discharge Condition: stable   Diet recommendation: as tolerated   History of present illness:   71 y.o.femalewith medical history significant ofHIV followed by Dr. Baxter Flattery, anxiety, HTN, hx of breast cancer, presents with increased redness, pain, involving bilateral LE x 1 day prior to hospital visit. Denies fevers or chills. Does report increased generalized weakness. Pt does live by herself. Given increased weakness and LE pain and redness, pt presented to ED for further work up. Of note, patient has not tried oral abx for above symptoms. B/L LE cellulitis. On IV ancef. Pain is improving but persistent on the left lower extremity.   Hospital Course:   1. Bilateral LE cellulitis 1. Patient with B LE erythema and pain x 1 day prior to this hospital visit 2. Dr. Baxter Flattery recommends short course of IV abx 3.  IV ancef day 1 for moderate nonpurulent cellulitis 4. Will continue clinda for 10 days  2. HIV 1. Followed by Dr. Baxter Flattery 2. Outpatient records reviewed. Patient with most recent CD4 of 340 and viral load of <20 in 11/18 3. Continue antiviral therapy  3. HTN 1. Continue home meds 4. Hx breast cancer 1. Patient had historically declined further treatment 5. HLD 1. Continue home meds 6. Weakness 1. [place] orders placed    Code Status: full Family Communication: family not at the bedside this  am    Consultants:  ID  Procedures:  None  Antimicrobials:  IV ancef day 1     Signed:  Leisa Lenz, MD  [facility] 09/22/2017, 1:33 PM  Pager #: [phone]  Time spent in minutes: more than 30 minutes   Discharge Exam: Vitals:   09/22/17 0612 09/22/17 1002  BP: 134/65 121/62  Pulse: (!) 59 68  Resp: 15   Temp: 98.9 F (37.2 C)   SpO2: 99%    Vitals:   09/21/17 1420 09/21/17 2146 09/22/17 0612 09/22/17 1002  BP: (!) 148/57 130/63 134/65 121/62  Pulse: (!) 57 (!) 50 (!) 59 68  Resp: 16 13 15    Temp: 98.5 F (36.9 C) 98.5 F (36.9 C) 98.9 F (37.2 C)   TempSrc: Oral Oral Oral   SpO2: 98% 98% 99%   Weight:      Height:        General: Pt is alert, follows commands appropriately, not in acute distress Cardiovascular: Regular rate and rhythm, S1/S2 + Respiratory: Clear to auscultation bilaterally, no wheezing, no crackles, no rhonchi Abdominal: Soft, non tender, non distended, bowel sounds +, no guarding Extremities: LLE cellulitis improving a little  Neuro: Grossly nonfocal  Discharge Instructions  Discharge Instructions    Call MD for:  persistant nausea and vomiting   Complete by:  As directed    Call MD for:  severe uncontrolled pain   Complete by:  As directed    Diet - low sodium heart healthy   Complete by:  As directed    Discharge instructions   Complete by:  As directed    [facility]  orders placed DME wheelchair order placed Continue clindamycin for 10 days on discahrge   Increase activity slowly   Complete by:  As directed      Allergies as of 09/22/2017   No Known Allergies     Medication List    STOP taking these medications   aspirin EC 325 MG tablet   lidocaine-prilocaine cream Commonly known as:  EMLA     TAKE these medications   acetaminophen 325 MG tablet Commonly known as:  TYLENOL Take 2 tablets (650 mg total) by mouth every 6 (six) hours as needed for mild pain (or Fever >/= 101).   AIRBORNE  PO Take 1 tablet by mouth daily.   atenolol 50 MG tablet Commonly known as:  TENORMIN TAKE 1 TABLET BY MOUTH EVERY DAY   bictegravir-emtricitabine-tenofovir AF 50-200-25 MG Tabs tablet Commonly known as:  BIKTARVY Take 1 tablet by mouth daily.   clindamycin 300 MG capsule Commonly known as:  CLEOCIN Take 1 capsule (300 mg total) by mouth 3 (three) times daily for 10 days.   Turmeric 500 MG Tabs Take 1 tablet by mouth daily.            Durable Medical Equipment  (From admission, onward)        Start     Ordered   09/22/17 1329  For home use only DME lightweight manual wheelchair with seat cushion  Once    Comments:  Patient suffers from weakness which impairs their ability to perform daily activities like walking in the home.  A cane will not resolve  issue with performing activities of daily living. A wheelchair will allow patient to safely perform daily activities. Patient is not able to propel themselves in the home using a standard weight wheelchair due to weakness. Patient can self propel in the lightweight wheelchair.  Accessories: elevating leg rests (ELRs), wheel locks, extensions and anti-tippers.   09/22/17 1329     Follow-up Information    [facility].   Specialty:  [place] Why:  nurse, physical therapy, occupational therapy, aide Contact information: [place] [address] [phone]        [name], MD. Schedule an appointment as soon as possible for a visit in 1 week(s).   Specialty:  Internal Medicine Contact information: [address] [place] [address] [phone]            The results of significant diagnostics from this hospitalization (including imaging, microbiology, ancillary and laboratory) are listed below for reference.    Significant Diagnostic Studies: Dg Ankle Complete Left  Result Date: 09/20/2017 CLINICAL DATA:  Soft tissue swelling and  redness of the left leg, pain EXAM: LEFT ANKLE COMPLETE - 3+ VIEW COMPARISON:  None. FINDINGS: The ankle joint appears normal. Alignment is normal. No fracture is seen. There is mild soft tissue swelling about the left ankle. IMPRESSION: Mild soft tissue swelling.  No bony abnormality. Electronically Signed   By: Ivar Drape M.D.   On: 09/20/2017 16:52   Nm Pet Image Restag (ps) Skull Base To Thigh  Result Date: 08/31/2017 CLINICAL DATA:  Subsequent treatment strategy for stage IV breast carcinoma. EXAM: NUCLEAR MEDICINE PET SKULL BASE TO THIGH TECHNIQUE: 8.8 mCi F-18 FDG was injected intravenously. Full-ring PET imaging was performed from the skull base to thigh after the radiotracer. CT data was obtained and used for attenuation correction and anatomic localization. FASTING BLOOD GLUCOSE:  Value: 68 mg/dl COMPARISON:  CT chest 07/23/2017, PET-CT 05/27/2015 FINDINGS: NECK No hypermetabolic lymph nodes in the neck. CHEST Within the anterior RIGHT chest wall, nodule lesion positioned between the muscle layers of the pectoralis musculature measures 15 mm (image 55, series 4) has intense metabolic activity for size with SUV max equal 11.6. There is less defined soft tissue more superiorly adjacent to this nodule (image 49, series 4) also with hypermetabolic activity (SUV max equal 4.0. Post surgical change in the RIGHT breast and axillary lymphadenectomy. Within LEFT upper lobe 6 mm nodule (image 63, series 4) has faint radiotracer activity. ABDOMEN/PELVIS No abnormal hypermetabolic activity within the liver, pancreas, adrenal glands, or spleen. No hypermetabolic lymph nodes in the abdomen or pelvis. SKELETON There is intense uptake associated with the RIGHT humeral head. There is serpiginous lesion with sub chondral collapse (favored degenerative change). No additional evidence skeletal metastasis per IMPRESSION: 1. Local breast cancer recurrence with hypermetabolic nodularity between the muscle layers of the  RIGHT pectoralis muscle. 2. LEFT upper lung lobe pulmonary nodule with mild metabolic activity is concerning for pulmonary metastasis. 3. Subchondral collapse of the RIGHT femoral head with intense metabolic activity. Favor degenerative change and less likely pathologic fracture from tumor metastasis. Electronically Signed   By: Suzy Bouchard M.D.   On: 08/31/2017 17:15    Microbiology: Recent Results (from the past 240 hour(s))  Culture, blood (routine x 2)     Status: None (Preliminary result)   Collection Time: 09/20/17  8:26 PM  Result Value Ref Range Status   Specimen Description BLOOD LEFT ARM  Final   Special Requests   Final    BOTTLES DRAWN AEROBIC ONLY Blood Culture adequate volume   Culture   Final    NO GROWTH 1 DAY Performed at [facility], [address] [place], [place] [address]    Report Status PENDING  Incomplete  Culture, blood (routine x 2)     Status: None (Preliminary result)   Collection Time: 09/20/17  8:26 PM  Result Value Ref Range Status   Specimen Description BLOOD LEFT ANTECUBITAL  Final   Special Requests   Final    BOTTLES DRAWN AEROBIC ONLY Blood Culture adequate volume   Culture   Final    NO GROWTH 1 DAY Performed at [facility], [place] [address] [place], [place] [address]    Report Status PENDING  Incomplete     Labs: Basic Metabolic Panel: Recent Labs  Lab 09/20/17 1358 09/21/17 0706 09/22/17 0313  NA 137 140 139  K 3.8 3.7 3.9  CL 104 108 108  CO2 25 25 25   GLUCOSE 88 91 85  BUN 10 10 16   CREATININE 0.87 0.97 0.91  CALCIUM 8.9 8.7* 8.4*   Liver Function Tests: Recent Labs  Lab 09/20/17 1358 09/21/17 0706  AST 27 21  ALT 26 20  ALKPHOS 106 86  BILITOT 0.8 0.4  PROT 7.3 6.2*  ALBUMIN 3.4* 2.8*   No results for input(s): LIPASE, AMYLASE in the last 168 hours. No results for input(s): AMMONIA in the last 168 hours. CBC: Recent Labs  Lab 09/20/17 1358 09/21/17 0706 09/22/17 0313  WBC 7.0 5.3 6.1   NEUTROABS 4.7  --   --   HGB 11.5* 10.6* 10.7*  HCT 35.5* 33.7* 34.1*  MCV 98.6 99.7 100.6*  PLT 213 203 208   Cardiac Enzymes: No results for input(s): CKTOTAL, CKMB, CKMBINDEX, TROPONINI in the last 168 hours. BNP: BNP (last 3 results) No results for input(s):  BNP in the last 8760 hours.  ProBNP (last 3 results) No results for input(s): PROBNP in the last 8760 hours.  CBG: No results for input(s): GLUCAP in the last 168 hours.

## 2017-09-22 NOTE — Discharge Instructions (Signed)

## 2017-09-26 LAB — CULTURE, BLOOD (ROUTINE X 2)
CULTURE: NO GROWTH
CULTURE: NO GROWTH
SPECIAL REQUESTS: ADEQUATE
Special Requests: ADEQUATE

## 2017-10-02 ENCOUNTER — Ambulatory Visit: Payer: Medicare Other | Admitting: Internal Medicine

## 2017-10-25 ENCOUNTER — Other Ambulatory Visit: Payer: Medicare Other

## 2017-10-25 ENCOUNTER — Inpatient Hospital Stay: Payer: Medicare Other | Attending: Oncology | Admitting: Oncology

## 2017-10-25 VITALS — BP 137/63 | HR 67 | Temp 98.1°F | Resp 18 | Ht 64.0 in | Wt 181.4 lb

## 2017-10-25 DIAGNOSIS — B20 Human immunodeficiency virus [HIV] disease: Secondary | ICD-10-CM | POA: Insufficient documentation

## 2017-10-25 DIAGNOSIS — C773 Secondary and unspecified malignant neoplasm of axilla and upper limb lymph nodes: Secondary | ICD-10-CM | POA: Insufficient documentation

## 2017-10-25 DIAGNOSIS — G8929 Other chronic pain: Secondary | ICD-10-CM | POA: Insufficient documentation

## 2017-10-25 DIAGNOSIS — Z170 Estrogen receptor positive status [ER+]: Secondary | ICD-10-CM | POA: Insufficient documentation

## 2017-10-25 DIAGNOSIS — C50211 Malignant neoplasm of upper-inner quadrant of right female breast: Secondary | ICD-10-CM

## 2017-10-25 DIAGNOSIS — Z171 Estrogen receptor negative status [ER-]: Secondary | ICD-10-CM | POA: Diagnosis not present

## 2017-10-25 DIAGNOSIS — C50911 Malignant neoplasm of unspecified site of right female breast: Secondary | ICD-10-CM | POA: Insufficient documentation

## 2017-10-25 NOTE — Progress Notes (Signed)
Hematology and Oncology Follow Up Visit  Kelly Chavez [id number] Nov 24, 1945 72 y.o. 10/25/2017 4:14 PM Kelly Chavez, MDShelton, Kelly Millin, MD   Principle Diagnosis: 72 year old woman with stage IIB (T2 N1) right-sided breast cancer. 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  He developed advanced disease detected in November 2018 with chest wall and possible lung recurrence.   Prior Therapy:  She is status post biopsy on 04/09/2014 which confirmed the presence of invasive carcinoma with axillary lymph node involvement. She is status post right modified radical mastectomy and insertion of a Port-A-Cath done on 06/15/2014. Her final pathology revealed T4bN1a. Her tumor showed lymphovascular invasion with involvement of the nipple but not the nipple and dermal lymphatics. One out of 16 lymph nodes involved.  Current therapy: Observation and surveillance she continues to decline systemic therapy.  Interim History:  Kelly Chavez here for a follow-up with her son.  He reports a hospitalization in December 2018 since the last visit.  He was diagnosed with cellulitis and was treated with intravenous antibiotics and subsequently discharged on clindamycin for 10 days.  She reports the erythema in her lower extremities has improved at this time.  She continues to have chronic pain which has not changed since the last visit.  She does not report any chest pain, fullness or difficulty breathing.  She continues to ambulate with the help of a walker without any falls or syncope.  Her appetite remained reasonable although she reports some complications related to her HIV medication predominantly some excessive fatigue and mild appetite changes.   She has not reported any headaches or blurry vision or syncope.  She does not report any fevers, chills or sweats.  Not reporting any chest pain palpitation or orthopnea. She she  has not reported any nausea or vomiting or abdominal pain. Has not reported any frequency urgency or hesitancy. Has not reported any skeletal complaints. Has not reported any arthralgias or myalgias. Rest of the review of systems unremarkable.    Medications: I have reviewed the patient's current medications.  Current Outpatient Medications  Medication Sig Dispense Refill  . acetaminophen (TYLENOL) 325 MG tablet Take 2 tablets (650 mg total) by mouth every 6 (six) hours as needed for mild pain (or Fever >/= 101). 30 tablet 0  . atenolol (TENORMIN) 50 MG tablet TAKE 1 TABLET BY MOUTH EVERY DAY 90 tablet 1  . bictegravir-emtricitabine-tenofovir AF (BIKTARVY) 50-200-25 MG TABS tablet Take 1 tablet by mouth daily. 30 tablet 11  . Multiple Vitamins-Minerals (AIRBORNE PO) Take 1 tablet by mouth daily.    . Turmeric 500 MG TABS Take 1 tablet by mouth daily.     Current Facility-Administered Medications  Medication Dose Route Frequency Provider Last Rate Last Dose  . baclofen (LIORESAL) tablet 5 mg  5 mg Oral BID Garvin Fila, MD         Allergies: No Known Allergies  Past Medical History, Surgical history, Social history, and Family History were reviewed again and updated.  Physical Exam: Blood pressure 137/63, pulse 67, temperature 98.1 F (36.7 C), temperature source Oral, resp. rate 18, height 5' 4"  (1.626 m), weight 181 lb 6.4 oz (82.3 kg), SpO2 100 %. ECOG: 1 General appearance: Alert, awake woman appeared comfortable. Head: Normocephalic, without obvious abnormality  Oral mucosa: No thrush or ulcers noted. Eyes: No scleral icterus. Lymph nodes: Cervical, supraclavicular, and axillary nodes normal. Heart:regular rate and rhythm, S1, S2  normal, no murmur, click, rub or gallop Chest wall examination:  Port-A-Cath in place.  No masses or lesions. Lung: Clear without wheezes, rhonchi or dullness to percussion. Abdomin: soft, non-tender, without masses or organomegaly no rebound or  guarding. Skeletal: No joint swelling or tenderness. Skin showed no rashes or lesions.  Lab Results: Lab Results  Component Value Date   WBC 6.1 09/22/2017   HGB 10.7 (L) 09/22/2017   HCT 34.1 (L) 09/22/2017   MCV 100.6 (H) 09/22/2017   PLT 208 09/22/2017     Chemistry      Component Value Date/Time   NA 139 09/22/2017 0313   NA 141 08/31/2017 1110   K 3.9 09/22/2017 0313   K 4.3 08/31/2017 1110   CL 108 09/22/2017 0313   CO2 25 09/22/2017 0313   CO2 24 08/31/2017 1110   BUN 16 09/22/2017 0313   BUN 21.6 08/31/2017 1110   CREATININE 0.91 09/22/2017 0313   CREATININE 1.5 (H) 08/31/2017 1110      Component Value Date/Time   CALCIUM 8.4 (L) 09/22/2017 0313   CALCIUM 9.4 08/31/2017 1110   ALKPHOS 86 09/21/2017 0706   ALKPHOS 104 08/31/2017 1110   AST 21 09/21/2017 0706   AST 32 08/31/2017 1110   ALT 20 09/21/2017 0706   ALT 43 08/31/2017 1110   BILITOT 0.4 09/21/2017 0706   BILITOT 0.26 08/31/2017 1110     Impression and Plan:  72 year old woman with the following issues:   1. Invasive ductal carcinoma of the right breast presented with a 4 cm mass as well as palpable adenopathy. She is status post mastectomy with her tumor found to be T4b N1a disease with nipple involvement on 06/15/2014. She declined adjuvant chemotherapy on multiple occasions.  PET/CT scan obtained on 08/31/2017 was reviewed again and discussed with the patient and her son.  Options of therapy were discussed again which include systemic chemotherapy in the form of oral Xeloda among other agents.  Risks and benefits associated with each therapy were reviewed again.  Complications include nausea, vomiting, myelosuppression, and others were reviewed.  She continues to refuse systemic chemotherapy for the time being and elected to continue with observation and surveillance.  She understands he might really develop worsening complications related to her malignancy.    2. IV access: Port-A-Cath will be  flushed with the next visit.  3. HIV: Continues to be on appropriate medication and followed by Dr. Graylon Good.  4.  Prognosis: This was addressed again and she continued to understand that she has an incurable malignancy.  Her cancer can be palliated with chemotherapy however and she will continue to consider that option.  5. Follow-up: Will be in 8 weeks to follow her progress.  15 minutes was spent face-to-face today with the patient.  More than 50% of that time was spent counseling, educating patient and coordinating her care.  Zola Button, MD 1/10/20194:14 PM

## 2017-10-29 ENCOUNTER — Telehealth: Payer: Self-pay | Admitting: Oncology

## 2017-10-29 NOTE — Telephone Encounter (Signed)
Scheduled appt per 1/10 los - left message with appt date and time.

## 2017-11-28 ENCOUNTER — Other Ambulatory Visit: Payer: Self-pay | Admitting: Internal Medicine

## 2017-11-28 DIAGNOSIS — I10 Essential (primary) hypertension: Secondary | ICD-10-CM

## 2017-12-28 ENCOUNTER — Inpatient Hospital Stay: Payer: Medicare Other | Attending: Oncology | Admitting: Oncology

## 2017-12-28 ENCOUNTER — Inpatient Hospital Stay: Payer: Medicare Other

## 2017-12-28 VITALS — BP 154/65 | HR 57 | Temp 97.8°F | Resp 17 | Ht 64.0 in | Wt 174.0 lb

## 2017-12-28 DIAGNOSIS — C50211 Malignant neoplasm of upper-inner quadrant of right female breast: Secondary | ICD-10-CM

## 2017-12-28 DIAGNOSIS — B20 Human immunodeficiency virus [HIV] disease: Secondary | ICD-10-CM

## 2017-12-28 DIAGNOSIS — Z171 Estrogen receptor negative status [ER-]: Secondary | ICD-10-CM | POA: Diagnosis not present

## 2017-12-28 DIAGNOSIS — C50911 Malignant neoplasm of unspecified site of right female breast: Secondary | ICD-10-CM | POA: Insufficient documentation

## 2017-12-28 DIAGNOSIS — C773 Secondary and unspecified malignant neoplasm of axilla and upper limb lymph nodes: Secondary | ICD-10-CM | POA: Diagnosis not present

## 2017-12-28 DIAGNOSIS — Z95828 Presence of other vascular implants and grafts: Secondary | ICD-10-CM

## 2017-12-28 DIAGNOSIS — Z9011 Acquired absence of right breast and nipple: Secondary | ICD-10-CM | POA: Insufficient documentation

## 2017-12-28 LAB — CBC WITH DIFFERENTIAL (CANCER CENTER ONLY)
Basophils Absolute: 0 10*3/uL (ref 0.0–0.1)
Basophils Relative: 1 %
EOS ABS: 0.1 10*3/uL (ref 0.0–0.5)
Eosinophils Relative: 2 %
HCT: 37.9 % (ref 34.8–46.6)
HEMOGLOBIN: 11.9 g/dL (ref 11.6–15.9)
LYMPHS ABS: 1.7 10*3/uL (ref 0.9–3.3)
LYMPHS PCT: 39 %
MCH: 29.8 pg (ref 25.1–34.0)
MCHC: 31.4 g/dL — AB (ref 31.5–36.0)
MCV: 95 fL (ref 79.5–101.0)
MONOS PCT: 12 %
Monocytes Absolute: 0.5 10*3/uL (ref 0.1–0.9)
NEUTROS PCT: 46 %
Neutro Abs: 2 10*3/uL (ref 1.5–6.5)
Platelet Count: 212 10*3/uL (ref 145–400)
RBC: 3.99 MIL/uL (ref 3.70–5.45)
RDW: 13.6 % (ref 11.2–14.5)
WBC: 4.3 10*3/uL (ref 3.9–10.3)

## 2017-12-28 LAB — CMP (CANCER CENTER ONLY)
ALT: 29 U/L (ref 0–55)
AST: 31 U/L (ref 5–34)
Albumin: 3.4 g/dL — ABNORMAL LOW (ref 3.5–5.0)
Alkaline Phosphatase: 119 U/L (ref 40–150)
Anion gap: 6 (ref 3–11)
BUN: 16 mg/dL (ref 7–26)
CHLORIDE: 110 mmol/L — AB (ref 98–109)
CO2: 24 mmol/L (ref 22–29)
CREATININE: 0.86 mg/dL (ref 0.60–1.10)
Calcium: 9.7 mg/dL (ref 8.4–10.4)
Glucose, Bld: 79 mg/dL (ref 70–140)
Potassium: 3.9 mmol/L (ref 3.5–5.1)
SODIUM: 140 mmol/L (ref 136–145)
Total Bilirubin: <0.2 mg/dL — ABNORMAL LOW (ref 0.2–1.2)
Total Protein: 7.9 g/dL (ref 6.4–8.3)

## 2017-12-28 MED ORDER — SODIUM CHLORIDE 0.9 % IJ SOLN
10.0000 mL | INTRAMUSCULAR | Status: DC | PRN
  Administered 2017-12-28: 10 mL via INTRAVENOUS
  Filled 2017-12-28: qty 10

## 2017-12-28 MED ORDER — HEPARIN SOD (PORK) LOCK FLUSH 100 UNIT/ML IV SOLN
500.0000 [IU] | Freq: Once | INTRAVENOUS | Status: AC | PRN
  Administered 2017-12-28: 500 [IU] via INTRAVENOUS
  Filled 2017-12-28: qty 5

## 2017-12-28 NOTE — Progress Notes (Signed)
Hematology and Oncology Follow Up Visit  Kelly Chavez [id number] 1946/06/28 72 y.o. 12/28/2017 12:20 PM [name], MDShelton, [name], MD   Principle Diagnosis: 72 year old woman with advanced breast cancer with local recurrence of the chest wall.  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     Prior Therapy:  She is status post biopsy on 04/09/2014 which confirmed the presence of invasive carcinoma with axillary lymph node involvement. She is status post right modified radical mastectomy and insertion of a Port-A-Cath done on 06/15/2014. Her final pathology revealed T4bN1a. Her tumor showed lymphovascular invasion with involvement of the nipple but not the nipple and dermal lymphatics. One out of 16 lymph nodes involved.  Current therapy: Under consideration to start systemic therapy which she has declined in the past.  Interim History:  Kelly Chavez presents today for a follow-up with her son.  Since the last visit, she reports no major changes in her health.  She continues to be overall weak and debilitated but has not really changed dramatically.  He does use a walker and a cane for mobility although she spends the majority of the day in a chair.  She denies any chest wall pain, discomfort or shortness of breath.  She denies any hemoptysis or hematocrit emesis.  Her appetite has been reasonable although she has lost some weight.   She has not reported any headaches or blurry vision or syncope.  She does not report any fevers, chills or sweats.  Not reporting any chest pain palpitation or orthopnea. She she has not reported any nausea or vomiting or abdominal pain. Has not reported any frequency urgency or hesitancy. Has not reported any skeletal complaints. Has not reported any arthralgias or myalgias.  She does not report any skin rashes or  lesions.  She does not report any lymphadenopathy or petechiae.  Rest of the review of systems is negative.   Medications: I have reviewed the patient's current medications.  Current Outpatient Medications  Medication Sig Dispense Refill  . acetaminophen (TYLENOL) 325 MG tablet Take 2 tablets (650 mg total) by mouth every 6 (six) hours as needed for mild pain (or Fever >/= 101). 30 tablet 0  . atenolol (TENORMIN) 50 MG tablet TAKE 1 TABLET BY MOUTH EVERY DAY 30 tablet 0  . bictegravir-emtricitabine-tenofovir AF (BIKTARVY) 50-200-25 MG TABS tablet Biktarvy 50 mg-200 mg-25 mg tablet    . Multiple Vitamins-Minerals (AIRBORNE PO) Take 1 tablet by mouth daily.    Marland Kitchen thyroid (ARMOUR THYROID) 60 MG tablet Armour Thyroid 60 mg tablet    . Turmeric 500 MG TABS Take 1 tablet by mouth daily.     Current Facility-Administered Medications  Medication Dose Route Frequency Provider Last Rate Last Dose  . baclofen (LIORESAL) tablet 5 mg  5 mg Oral BID Garvin Fila, MD         Allergies: No Known Allergies  Past Medical History, Surgical history, Social history, and Family History were reviewed again and updated.  Physical Exam: Blood pressure (!) 154/65, pulse (!) 57, temperature 97.8 F (36.6 C), temperature source Oral, resp. rate 17, height 5' 4"  (1.626 m), weight 174 lb (78.9 kg), SpO2 100 %. ECOG: 1 General appearance: Comfortable appearing woman without distress. Head: Normocephalic, without obvious abnormality  Oral mucosa: Mucous membranes are moist and pink. Eyes: Pulls are equal and round reactive to light.  Lymph nodes: Cervical, supraclavicular, and axillary nodes normal. Heart:regular rate and rhythm, S1, S2 normal, no murmur, click, rub or gallop Lung: Clear in all lung fields without rhonchi or wheezes. Abdomin: soft, non-tender, without masses or organomegaly no rebound or guarding. Skeletal: No joint effusion or deformity.  Limited range of motion in her lower extremities. Skin  no petechiae or rash.  Lab Results: Lab Results  Component Value Date   WBC 4.3 12/28/2017   HGB 10.7 (L) 09/22/2017   HCT 37.9 12/28/2017   MCV 95.0 12/28/2017   PLT 212 12/28/2017     Chemistry      Component Value Date/Time   NA 139 09/22/2017 0313   NA 141 08/31/2017 1110   K 3.9 09/22/2017 0313   K 4.3 08/31/2017 1110   CL 108 09/22/2017 0313   CO2 25 09/22/2017 0313   CO2 24 08/31/2017 1110   BUN 16 09/22/2017 0313   BUN 21.6 08/31/2017 1110   CREATININE 0.91 09/22/2017 0313   CREATININE 1.5 (H) 08/31/2017 1110      Component Value Date/Time   CALCIUM 8.4 (L) 09/22/2017 0313   CALCIUM 9.4 08/31/2017 1110   ALKPHOS 86 09/21/2017 0706   ALKPHOS 104 08/31/2017 1110   AST 21 09/21/2017 0706   AST 32 08/31/2017 1110   ALT 20 09/21/2017 0706   ALT 43 08/31/2017 1110   BILITOT 0.4 09/21/2017 0706   BILITOT 0.26 08/31/2017 1110     Impression and Plan:  72 year old woman with the following issues:   1.  Advanced breast cancer with her initial diagnosis in 2015.  She found to have  T4b N1a disease with nipple involvement on 06/15/2014. She declined adjuvant chemotherapy.   She has documented recurrent disease and October 2018 but has declined systemic therapy.  The natural course of her disease was reviewed again today in detail for her and her son.  She understands that she has an incurable malignancy but disease that can be palliated with systemic chemotherapy.  Many options of chemotherapy were reviewed and are active and breast cancer in the first line will be Xeloda.  Risks and benefits and complication associated with this medication were reviewed today in detail.  These complications include fatigue, diarrhea, myelosuppression, hand-foot syndrome among others.  She is still not sure whether she wants to proceed with treatment or not.  I gave her written information at this time and she is more willing to consider it.  We will update her staging CT scan in the  immediate future and consider starting it if she is agreeable.  We will also check a drug interaction with her HIV medication.  2. IV access: Port-A-Cath will be flushed with the next visit.  3. HIV: Continues to be on Biktarvy under the care of Dr. Baxter Flattery.   4.  Prognosis: He has an incurable malignancy and overall prognosis is poor.  Her performance status is reasonable to consider palliative chemotherapy.  5. Follow-up: Will be in 6 weeks to follow her progress.  15 minutes was spent face-to-face today with the patient.  More than 50% of that time was spent counseling, education and answering question regarding treatment options.  Zola Button, MD 3/15/201912:20 PM

## 2018-01-02 ENCOUNTER — Encounter: Payer: Self-pay | Admitting: Oncology

## 2018-01-03 ENCOUNTER — Telehealth: Payer: Self-pay | Admitting: *Deleted

## 2018-01-03 NOTE — Telephone Encounter (Signed)
Per patient's email, I called and reviewed her April schedule with her. I gave her central scheduling's phone number so, she could schedule her scans. She verbalized understanding.

## 2018-02-05 ENCOUNTER — Encounter: Payer: Self-pay | Admitting: Oncology

## 2018-02-07 ENCOUNTER — Telehealth: Payer: Self-pay | Admitting: Oncology

## 2018-02-07 NOTE — Telephone Encounter (Signed)
Called to inform patient, her son Kelly Chavez) FMLA has been successfully faxed to [employer] Dept at [phone]. A copy has been mailed to patient as well.

## 2018-02-08 ENCOUNTER — Inpatient Hospital Stay: Payer: Medicare Other

## 2018-02-08 ENCOUNTER — Ambulatory Visit (HOSPITAL_COMMUNITY): Admission: RE | Admit: 2018-02-08 | Payer: Medicare Other | Source: Ambulatory Visit

## 2018-02-08 ENCOUNTER — Inpatient Hospital Stay: Payer: Medicare Other | Attending: Oncology

## 2018-02-08 ENCOUNTER — Encounter: Payer: Self-pay | Admitting: *Deleted

## 2018-02-08 ENCOUNTER — Telehealth: Payer: Self-pay | Admitting: *Deleted

## 2018-02-08 ENCOUNTER — Telehealth: Payer: Self-pay | Admitting: Oncology

## 2018-02-08 ENCOUNTER — Ambulatory Visit (HOSPITAL_COMMUNITY)
Admission: RE | Admit: 2018-02-08 | Discharge: 2018-02-08 | Disposition: A | Discharge status: Home / Self Care | Payer: Medicare Other | Source: Ambulatory Visit | Attending: Oncology | Admitting: Oncology

## 2018-02-08 DIAGNOSIS — C7951 Secondary malignant neoplasm of bone: Secondary | ICD-10-CM | POA: Diagnosis not present

## 2018-02-08 DIAGNOSIS — C50211 Malignant neoplasm of upper-inner quadrant of right female breast: Secondary | ICD-10-CM

## 2018-02-08 DIAGNOSIS — Z9011 Acquired absence of right breast and nipple: Secondary | ICD-10-CM | POA: Diagnosis not present

## 2018-02-08 DIAGNOSIS — Z170 Estrogen receptor positive status [ER+]: Principal | ICD-10-CM

## 2018-02-08 DIAGNOSIS — M899 Disorder of bone, unspecified: Secondary | ICD-10-CM | POA: Insufficient documentation

## 2018-02-08 DIAGNOSIS — R590 Localized enlarged lymph nodes: Secondary | ICD-10-CM | POA: Insufficient documentation

## 2018-02-08 DIAGNOSIS — C7800 Secondary malignant neoplasm of unspecified lung: Secondary | ICD-10-CM | POA: Diagnosis not present

## 2018-02-08 DIAGNOSIS — R630 Anorexia: Secondary | ICD-10-CM | POA: Insufficient documentation

## 2018-02-08 DIAGNOSIS — Z171 Estrogen receptor negative status [ER-]: Secondary | ICD-10-CM | POA: Insufficient documentation

## 2018-02-08 DIAGNOSIS — I700 Atherosclerosis of aorta: Secondary | ICD-10-CM | POA: Diagnosis not present

## 2018-02-08 DIAGNOSIS — C7989 Secondary malignant neoplasm of other specified sites: Secondary | ICD-10-CM | POA: Diagnosis not present

## 2018-02-08 DIAGNOSIS — C773 Secondary and unspecified malignant neoplasm of axilla and upper limb lymph nodes: Secondary | ICD-10-CM | POA: Insufficient documentation

## 2018-02-08 DIAGNOSIS — B20 Human immunodeficiency virus [HIV] disease: Secondary | ICD-10-CM | POA: Diagnosis not present

## 2018-02-08 DIAGNOSIS — C50911 Malignant neoplasm of unspecified site of right female breast: Secondary | ICD-10-CM | POA: Insufficient documentation

## 2018-02-08 LAB — CMP (CANCER CENTER ONLY)
ALBUMIN: 3.6 g/dL (ref 3.5–5.0)
ALK PHOS: 124 U/L (ref 40–150)
ALT: 30 U/L (ref 0–55)
ANION GAP: 9 (ref 3–11)
AST: 31 U/L (ref 5–34)
BUN: 19 mg/dL (ref 7–26)
CHLORIDE: 107 mmol/L (ref 98–109)
CO2: 25 mmol/L (ref 22–29)
Calcium: 9.7 mg/dL (ref 8.4–10.4)
Creatinine: 0.89 mg/dL (ref 0.60–1.10)
GFR, Estimated: >60 mL/min (ref >60)
Glucose, Bld: 76 mg/dL (ref 70–140)
POTASSIUM: 4 mmol/L (ref 3.5–5.1)
Sodium: 141 mmol/L (ref 136–145)
Total Protein: 8.1 g/dL (ref 6.4–8.3)

## 2018-02-08 LAB — CBC WITH DIFFERENTIAL (CANCER CENTER ONLY)
Basophils Absolute: 0 10*3/uL (ref 0.0–0.1)
Basophils Relative: 1 %
Eosinophils Absolute: 0.1 10*3/uL (ref 0.0–0.5)
Eosinophils Relative: 2 %
HEMATOCRIT: 39.4 % (ref 34.8–46.6)
HEMOGLOBIN: 12.3 g/dL (ref 11.6–15.9)
LYMPHS PCT: 35 %
Lymphs Abs: 1.5 10*3/uL (ref 0.9–3.3)
MCH: 29.6 pg (ref 25.1–34.0)
MCHC: 31.2 g/dL — ABNORMAL LOW (ref 31.5–36.0)
MCV: 94.9 fL (ref 79.5–101.0)
Monocytes Absolute: 0.4 10*3/uL (ref 0.1–0.9)
Monocytes Relative: 9 %
NEUTROS ABS: 2.2 10*3/uL (ref 1.5–6.5)
NEUTROS PCT: 53 %
Platelet Count: 233 10*3/uL (ref 145–400)
RBC: 4.15 MIL/uL (ref 3.70–5.45)
RDW: 14.6 % — ABNORMAL HIGH (ref 11.2–14.5)
WBC: 4.2 10*3/uL (ref 3.9–10.3)

## 2018-02-08 MED ORDER — IOHEXOL 300 MG/ML  SOLN
100.0000 mL | Freq: Once | INTRAMUSCULAR | Status: AC | PRN
  Administered 2018-02-08: 100 mL via INTRAVENOUS

## 2018-02-08 MED ORDER — HEPARIN SOD (PORK) LOCK FLUSH 100 UNIT/ML IV SOLN
500.0000 [IU] | Freq: Once | INTRAVENOUS | Status: AC
  Administered 2018-02-08: 500 [IU] via INTRAVENOUS

## 2018-02-08 MED ORDER — HEPARIN SOD (PORK) LOCK FLUSH 100 UNIT/ML IV SOLN
INTRAVENOUS | Status: AC
  Filled 2018-02-08: qty 5

## 2018-02-08 MED ORDER — SODIUM CHLORIDE 0.9 % IJ SOLN
10.0000 mL | Freq: Once | INTRAMUSCULAR | Status: AC
  Administered 2018-02-08: 10 mL via INTRAVENOUS
  Filled 2018-02-08: qty 10

## 2018-02-08 NOTE — Telephone Encounter (Signed)
Rescheduled patient according to dixie

## 2018-02-08 NOTE — Telephone Encounter (Signed)
FYI  "Sick last night so I called, spoke with Kelly Chavez who cancelled today's appointments.  I feel better but could not reach anyone to reschedule this morning's appointments for lab and radiology.  What is the radiology number?  I'll also need to reschedule the April 30 th appointment.  Do I need the lab?"  Provided [facility].  Routing information for provider.

## 2018-02-08 NOTE — Patient Instructions (Signed)

## 2018-02-08 NOTE — Telephone Encounter (Signed)
Spoke with patient's son Legrand Como and he states patient will be able to get scans done today after all. Los to schedulers to re-schedule f/u visit with dr Alen Blew on 02/12/18

## 2018-02-12 ENCOUNTER — Other Ambulatory Visit: Payer: Medicare Other

## 2018-02-12 ENCOUNTER — Inpatient Hospital Stay (HOSPITAL_BASED_OUTPATIENT_CLINIC_OR_DEPARTMENT_OTHER): Payer: Medicare Other | Admitting: Oncology

## 2018-02-12 ENCOUNTER — Telehealth: Payer: Self-pay | Admitting: Pharmacist

## 2018-02-12 ENCOUNTER — Telehealth: Payer: Self-pay | Admitting: Pharmacy Technician

## 2018-02-12 ENCOUNTER — Ambulatory Visit: Payer: Medicare Other | Admitting: Oncology

## 2018-02-12 DIAGNOSIS — C7989 Secondary malignant neoplasm of other specified sites: Secondary | ICD-10-CM | POA: Diagnosis not present

## 2018-02-12 DIAGNOSIS — C50911 Malignant neoplasm of unspecified site of right female breast: Secondary | ICD-10-CM | POA: Diagnosis not present

## 2018-02-12 DIAGNOSIS — Z9011 Acquired absence of right breast and nipple: Secondary | ICD-10-CM

## 2018-02-12 DIAGNOSIS — B20 Human immunodeficiency virus [HIV] disease: Secondary | ICD-10-CM

## 2018-02-12 DIAGNOSIS — Z171 Estrogen receptor negative status [ER-]: Secondary | ICD-10-CM

## 2018-02-12 DIAGNOSIS — R630 Anorexia: Secondary | ICD-10-CM

## 2018-02-12 DIAGNOSIS — Z170 Estrogen receptor positive status [ER+]: Principal | ICD-10-CM

## 2018-02-12 DIAGNOSIS — C7951 Secondary malignant neoplasm of bone: Secondary | ICD-10-CM | POA: Diagnosis not present

## 2018-02-12 DIAGNOSIS — C773 Secondary and unspecified malignant neoplasm of axilla and upper limb lymph nodes: Secondary | ICD-10-CM

## 2018-02-12 DIAGNOSIS — C50211 Malignant neoplasm of upper-inner quadrant of right female breast: Secondary | ICD-10-CM

## 2018-02-12 DIAGNOSIS — C7800 Secondary malignant neoplasm of unspecified lung: Secondary | ICD-10-CM

## 2018-02-12 MED ORDER — CAPECITABINE 500 MG PO TABS: ORAL_TABLET | ORAL | 0 refills | Status: DC

## 2018-02-12 NOTE — Progress Notes (Signed)
Hematology and Oncology Follow Up Visit  Kelly Chavez [id number] May 29, 1946 72 y.o. 02/12/2018 12:19 PM [name], MDShelton, [name], MD   Principle Diagnosis: 72 year old woman with advanced breast cancer and documented disease of the chest wall, lung and bone documented in 2018.  She was initially diagnosed with stage IIB (T2 N1) right-sided breast cancer, ER PR negative HER-2 negative in 2015.  Prior Therapy:  She is status post biopsy on 04/09/2014 which confirmed the presence of invasive carcinoma with axillary lymph node involvement. She is status post right modified radical mastectomy and insertion of a Port-A-Cath done on 06/15/2014. Her final pathology revealed T4bN1a. Her tumor showed lymphovascular invasion with involvement of the nipple but not the nipple and dermal lymphatics. One out of 16 lymph nodes involved.  Current therapy: Active surveillance as she declined chemotherapy previously.  She continues to be under consideration to start therapy.  Interim History:  Kelly Chavez is here for a follow-up.  She reports no major changes in her health since the last visit.  She does report a mild decline in her performance status and activity level as well as decline in her appetite.  Her weight has not really changed dramatically.  She does have diffuse pain but has not changed from previous examinations.  She denies any increase in the quality of that pain and no specific discomfort in her chest or back area.  Her overall quality of life is very limited but not dramatically different.   She has not reported any headaches or blurry vision or syncope.  She does not report any fevers, chills or sweats.  He denies any chest pain, orthopnea or dyspnea on exertion.  She denied cough, wheezing or hemoptysis.  She she has not reported any nausea or vomiting or abdominal pain. Has not reported any frequency urgency or hesitancy.Has not reported any arthralgias or myalgias.  She does not report  any skin rashes or lesions.  She does not report any lymphadenopathy or petechiae.  She does not report any worsening anxiety or depression.  She does not report any heat or cold intolerance.  Rest of the review of systems is negative.   Medications: I have reviewed the patient's current medications.  Current Outpatient Medications  Medication Sig Dispense Refill  . acetaminophen (TYLENOL) 325 MG tablet Take 2 tablets (650 mg total) by mouth every 6 (six) hours as needed for mild pain (or Fever >/= 101). 30 tablet 0  . atenolol (TENORMIN) 50 MG tablet TAKE 1 TABLET BY MOUTH EVERY DAY 30 tablet 0  . bictegravir-emtricitabine-tenofovir AF (BIKTARVY) 50-200-25 MG TABS tablet Biktarvy 50 mg-200 mg-25 mg tablet    . Multiple Vitamins-Minerals (AIRBORNE PO) Take 1 tablet by mouth daily.    Marland Kitchen thyroid (ARMOUR THYROID) 60 MG tablet Armour Thyroid 60 mg tablet    . Turmeric 500 MG TABS Take 1 tablet by mouth daily.     Current Facility-Administered Medications  Medication Dose Route Frequency Provider Last Rate Last Dose  . baclofen (LIORESAL) tablet 5 mg  5 mg Oral BID Garvin Fila, MD         Allergies: No Known Allergies  Past Medical History, Surgical history, Social history, and Family History unchanged at this time.  Physical Exam:  There were no vitals taken for this visit.   ECOG: 1 General appearance: Comfortable appearing woman without any distress today. Head: Atraumatic without abnormalities. Oral mucosa: No oral thrush or ulcers. Eyes: Sclera anicteric. Lymph nodes: No lymphadenopathy palpated in the  cervical, axillary or supraclavicular regions. Heart:regular rate and rhythm, without any murmurs. Lung: Clear without any rhonchi, wheezes or dullness to percussion. Abdomin: Soft, nontender without rebound or guarding. Skeletal: No clubbing or cyanosis. Skin ecchymosis petechiae.  Lab Results: Lab Results  Component Value Date   WBC 4.2 02/08/2018   HGB 12.3 02/08/2018    HCT 39.4 02/08/2018   MCV 94.9 02/08/2018   PLT 233 02/08/2018     Chemistry      Component Value Date/Time   NA 141 02/08/2018 1430   NA 141 08/31/2017 1110   K 4.0 02/08/2018 1430   K 4.3 08/31/2017 1110   CL 107 02/08/2018 1430   CO2 25 02/08/2018 1430   CO2 24 08/31/2017 1110   BUN 19 02/08/2018 1430   BUN 21.6 08/31/2017 1110   CREATININE 0.89 02/08/2018 1430   CREATININE 1.5 (H) 08/31/2017 1110      Component Value Date/Time   CALCIUM 9.7 02/08/2018 1430   CALCIUM 9.4 08/31/2017 1110   ALKPHOS 124 02/08/2018 1430   ALKPHOS 104 08/31/2017 1110   AST 31 02/08/2018 1430   AST 32 08/31/2017 1110   ALT 30 02/08/2018 1430   ALT 43 08/31/2017 1110   BILITOT <0.2 (L) 02/08/2018 1430   BILITOT 0.26 08/31/2017 1110     EXAM: CT CHEST, ABDOMEN, AND PELVIS WITH CONTRAST  TECHNIQUE: Multidetector CT imaging of the chest, abdomen and pelvis was performed following the standard protocol during bolus administration of intravenous contrast.  CONTRAST:  130m OMNIPAQUE IOHEXOL 300 MG/ML  SOLN  COMPARISON:  07/24/2015  FINDINGS: CT CHEST FINDINGS  Cardiovascular: Normal heart size. No pericardial effusion. Aortic atherosclerosis.  Mediastinum/Nodes: Normal appearance of the thyroid gland. The trachea appears patent and is midline. Small hiatal hernia. 1 cm subcarinal lymph node is new from previous exam, image 27/2. Also new is an enhancing right paratracheal lymph node which measures 1 cm, image 21/2. New right hilar node measures 1 cm, image 25/2.  Lungs/Pleura: No pleural effusion. Right upper lobe pulmonary nodule measures 1.3 cm, image 31/6. Previously 0.4 cm. Left upper lobe lung nodule measures 9 mm, image 69/6. Previously 7 mm.  Musculoskeletal: Conglomeration of nodes within the right supraclavicular region partially encasing the right subclavian artery and vein is again noted. This measures 2.9 by 5.9 x 3.2 cm. On the previous exam this measured  2.8 x 5.6 by 2.4 cm. Several adjacent enhancing nodules are identified within the overlying breast tissue. Index lesion measures 1.6 cm, image 14/2. Previously 1.3 cm.  No aggressive lytic or sclerotic bone lesions.  CT ABDOMEN PELVIS FINDINGS  Hepatobiliary: No focal liver abnormality is seen. No gallstones, gallbladder wall thickening, or biliary dilatation.  Pancreas: Unremarkable. No pancreatic ductal dilatation or surrounding inflammatory changes.  Spleen: Normal in size without focal abnormality.  Adrenals/Urinary Tract: The adrenal glands appear normal. Scarring and volume loss within the upper and lower poles of the right kidney identified. Similar appearance of bilateral kidney cysts. Urinary bladder appears normal.  Stomach/Bowel: The small bowel loops have a normal course and caliber. The appendix is visualized and appears normal. Unremarkable appearance of the colon.  Vascular/Lymphatic: Aortic atherosclerosis. No aneurysm. No upper abdominal adenopathy. No pelvic or inguinal adenopathy.  Reproductive: Status post hysterectomy. No adnexal masses.  Other: No abdominal wall hernia or abnormality. No abdominopelvic ascites.  Musculoskeletal: The bones appear osteopenic. Small foci of sclerosis are new from previous exam including along the anterior inferior aspect of L3 and posterior aspect of S1.  L4-5 degenerative disc disease with first degree anterolisthesis of L4 on L5 noted.  IMPRESSION: 1. Interval progression of disease compared with 07/23/2017. 2. The large right supraclavicular nodal mass has increased in size in the interval. This appears to involve both the subclavian artery and vein. 3. New borderline right hilar, right mediastinal and subcarinal lymph nodes. 4. Increase in size of pulmonary metastasis 5. Two new sclerotic lesions are identified involving the lumbar spine and sacrum. 6.  Aortic Atherosclerosis  (ICD10-I70.0).    Impression and Plan:  72 year old woman with the following issues:   1.  T4b N1a Breast cancer with her initial diagnosis in 2015.  She developed metastatic disease in 2018.  She has declined adjuvant chemotherapy and palliative chemotherapy when she has documented metastatic disease.  CT scan obtained on February 08, 2017 was personally reviewed today and showed worsening metastatic disease.  The natural course of this disease was discussed again with the patient and her son.  She understands he has incurable malignancy that will likely take over her body and debilitated her further.  She understands chemotherapy can offer temporary palliation of these symptoms.  Without treatment, she can experience morbidities such as worsening pain and further decline.  Options of therapy were discussed today which include systemic chemotherapy intravenously, oral chemotherapy in the form of Xeloda or supportive care and hospice.  After discussing these options, she is open to trying Xeloda at this time.  Complications associated with this medication was reviewed again which include nausea, fatigue, hand-foot syndrome, potential drug interaction among others.  We will start at a lower dose and titrate up depending on her tolerance.  She will receive 500 mg twice a day for 14 days out of a 21-day cycle.  Increase the dose depending on her tolerance.  She understands that she can discontinue therapy at any time and consider calling in hospice.  2. IV access: Port-A-Cath continues to be flushed periodically.  3. HIV: Continues to be on Biktarvy under the care of Dr. Baxter Flattery.   4.  Prognosis: I fear that her prognosis is poor regardless of choice of treatment.  She has an aggressive malignancy that despite the treatment she would likely suffer worsening consequences.  This was addressed with her today in detail.  5. Follow-up: Will be in 3 to 4 weeks to follow her progress.  25 minutes was spent  face-to-face today with the patient.  More than 50% of that time was spent counseling, education and answering question regarding treatment options.  Zola Button, MD 4/30/201912:19 PM

## 2018-02-12 NOTE — Telephone Encounter (Signed)
Oral Oncology Pharmacist Encounter  Received new prescription for Xeloda (capecitabine) for the treatment of metastatic, triple-negative breast cancer, planned duration until disease progression or unacceptable toxicity.  T4b N1a Breast cancer with her initial diagnosis in 2015.  She developed metastatic disease in 2018.  She has declined adjuvant chemotherapy and palliative chemotherapy when she has documented metastatic disease. Patient is open to treatment with Xeloda at this time.  Patient will be initiated at 500mg  BID for 14 days on, 7 days off (~260 mg/m2 BID). Dose will be escalated in the future based on toleration. Xeloda dosing for metastatic breast cancer up to 1000 mg/m2 BID  Labs from 02/08/18 assessed, OK for treatment.  Current medication list in Epic reviewed, no DDIs with Xeloda identified.  Insurance authorization is required and has been submitted. Prescription will be sent to appropriate specialty pharmacy once insurance authorization is obtained.  Patient will be updated about dispensing pharmacy and offered initial counseling once prescription is sent.  Oral Oncology Clinic will continue to follow for insurance authorization, copayment issues, initial counseling and start date.  Johny Drilling, PharmD, BCPS, BCOP 02/12/2018 3:03 PM Oral Oncology Clinic [phone]

## 2018-02-12 NOTE — Telephone Encounter (Signed)
Oral Oncology Patient Advocate Encounter  Received notification from [facility] that prior authorization for Xeloda is required.  PA submitted on CoverMyMeds Key [id number] Status is pending  Oral Oncology Clinic will continue to follow.  Fabio Asa. Melynda Keller, [place] Patient [place] [phone] 02/12/2018 2:58 PM

## 2018-02-13 ENCOUNTER — Telehealth: Payer: Self-pay | Admitting: Oncology

## 2018-02-13 MED ORDER — CAPECITABINE 500 MG PO TABS: ORAL_TABLET | ORAL | 0 refills | Status: DC

## 2018-02-13 NOTE — Telephone Encounter (Signed)
Oral Oncology Patient Advocate Encounter  Prior Authorization for Xeloda has been approved.    PA# [id number] Effective dates: 02/12/18 through 02/13/19  Oral Oncology Clinic will continue to follow.   Fabio Asa. Melynda Keller, [place] Patient [place] [phone] 02/13/2018 8:36 AM

## 2018-02-13 NOTE — Telephone Encounter (Signed)
Oral Chemotherapy Pharmacist Encounter   Attempted to reach patient to provide update and offer for initial counseling on oral medication: Xeloda.   No answer. Left VM for patient to call back with any questions or issues.   Patient also provided information that [facility] per insurance requirement.  Patient instructed to be looking out for a call from dispensing pharmacy to discuss copayment and schedule delivery off her Xeloda and to call dispensing pharmacy if she has not yet heard from them by Monday (02/18/2018).  I left dispensing pharmacy phone number on patient's voicemail [phone] or [phone])  I also left phone number to oral oncology clinic on patient's voicemail.  Oral oncology clinic will continue to follow.  Thank you,  Johny Drilling, PharmD, BCPS, BCOP 02/13/2018   1:52 PM Oral Oncology Clinic [phone]

## 2018-02-13 NOTE — Telephone Encounter (Signed)
Spoke to patient's son, Tiffney Haughton, about FMLA paperwork. Updated return to work date to 03/23/2018 and successfully faxed form back to [facility] Benefits Department at [phone].

## 2018-02-15 ENCOUNTER — Ambulatory Visit: Payer: Medicare Other | Admitting: Oncology

## 2018-02-19 ENCOUNTER — Other Ambulatory Visit: Payer: Self-pay | Admitting: Internal Medicine

## 2018-02-19 DIAGNOSIS — I10 Essential (primary) hypertension: Secondary | ICD-10-CM

## 2018-02-20 ENCOUNTER — Other Ambulatory Visit: Payer: Self-pay | Admitting: *Deleted

## 2018-02-20 ENCOUNTER — Other Ambulatory Visit: Payer: Self-pay

## 2018-02-20 DIAGNOSIS — I10 Essential (primary) hypertension: Secondary | ICD-10-CM

## 2018-02-20 MED ORDER — ATENOLOL 50 MG PO TABS: 50.0000 mg | ORAL_TABLET | Freq: Every day | ORAL | 0 refills | Status: DC

## 2018-03-07 NOTE — Telephone Encounter (Signed)
Oral Oncology Patient Advocate Encounter  Prior Authorization for Xeloda has been approved.    PA# [id number] Effective dates: 02/12/2018 through 02/13/2019  Oral Oncology Clinic will continue to follow.

## 2018-03-08 NOTE — Telephone Encounter (Signed)
Oral Oncology Patient Advocate Encounter  Contacted [facility] on 02/28/2018  to follow up on the status of the patient's initial fill of Xeloda.  They have made several attempts to reach the patient to schedule delivery with no success.    I left the patient messages on 5/16 and 5/22 to contact [facility] to schedule shipment of her medication.    As of today, it has yet to be scheduled.   Fabio Asa. Melynda Keller, [employer] Patient [place] [phone] 03/08/2018 10:24 AM

## 2018-03-13 ENCOUNTER — Inpatient Hospital Stay: Payer: Medicare Other | Attending: Oncology

## 2018-03-13 ENCOUNTER — Inpatient Hospital Stay: Payer: Medicare Other

## 2018-03-13 DIAGNOSIS — C50211 Malignant neoplasm of upper-inner quadrant of right female breast: Secondary | ICD-10-CM

## 2018-03-13 DIAGNOSIS — C7800 Secondary malignant neoplasm of unspecified lung: Secondary | ICD-10-CM | POA: Diagnosis not present

## 2018-03-13 DIAGNOSIS — C773 Secondary and unspecified malignant neoplasm of axilla and upper limb lymph nodes: Secondary | ICD-10-CM | POA: Insufficient documentation

## 2018-03-13 DIAGNOSIS — Z170 Estrogen receptor positive status [ER+]: Secondary | ICD-10-CM

## 2018-03-13 DIAGNOSIS — C7989 Secondary malignant neoplasm of other specified sites: Secondary | ICD-10-CM | POA: Insufficient documentation

## 2018-03-13 DIAGNOSIS — C50911 Malignant neoplasm of unspecified site of right female breast: Secondary | ICD-10-CM | POA: Insufficient documentation

## 2018-03-13 DIAGNOSIS — C7951 Secondary malignant neoplasm of bone: Secondary | ICD-10-CM | POA: Diagnosis not present

## 2018-03-13 DIAGNOSIS — Z95828 Presence of other vascular implants and grafts: Secondary | ICD-10-CM

## 2018-03-13 LAB — CMP (CANCER CENTER ONLY)
ALT: 22 U/L (ref 0–55)
AST: 25 U/L (ref 5–34)
Albumin: 3.7 g/dL (ref 3.5–5.0)
Alkaline Phosphatase: 122 U/L (ref 40–150)
Anion gap: 11 (ref 3–11)
BUN: 17 mg/dL (ref 7–26)
CHLORIDE: 108 mmol/L (ref 98–109)
CO2: 23 mmol/L (ref 22–29)
CREATININE: 0.98 mg/dL (ref 0.60–1.10)
Calcium: 9.5 mg/dL (ref 8.4–10.4)
GFR, EST NON AFRICAN AMERICAN: 57 mL/min — AB (ref >60)
Glucose, Bld: 77 mg/dL (ref 70–140)
Potassium: 4 mmol/L (ref 3.5–5.1)
Sodium: 142 mmol/L (ref 136–145)
Total Bilirubin: 0.3 mg/dL (ref 0.2–1.2)
Total Protein: 8.4 g/dL — ABNORMAL HIGH (ref 6.4–8.3)

## 2018-03-13 LAB — CBC WITH DIFFERENTIAL (CANCER CENTER ONLY)
Basophils Absolute: 0 10*3/uL (ref 0.0–0.1)
Basophils Relative: 0 %
EOS PCT: 2 %
Eosinophils Absolute: 0.1 10*3/uL (ref 0.0–0.5)
HCT: 38.8 % (ref 34.8–46.6)
Hemoglobin: 12.1 g/dL (ref 11.6–15.9)
LYMPHS ABS: 1.4 10*3/uL (ref 0.9–3.3)
LYMPHS PCT: 32 %
MCH: 30 pg (ref 25.1–34.0)
MCHC: 31.2 g/dL — ABNORMAL LOW (ref 31.5–36.0)
MCV: 96 fL (ref 79.5–101.0)
MONO ABS: 0.5 10*3/uL (ref 0.1–0.9)
Monocytes Relative: 12 %
Neutro Abs: 2.4 10*3/uL (ref 1.5–6.5)
Neutrophils Relative %: 54 %
PLATELETS: 192 10*3/uL (ref 145–400)
RBC: 4.04 MIL/uL (ref 3.70–5.45)
RDW: 14.9 % — AB (ref 11.2–14.5)
WBC Count: 4.5 10*3/uL (ref 3.9–10.3)

## 2018-03-13 MED ORDER — HEPARIN SOD (PORK) LOCK FLUSH 100 UNIT/ML IV SOLN
500.0000 [IU] | Freq: Once | INTRAVENOUS | Status: AC | PRN
  Administered 2018-03-13: 500 [IU] via INTRAVENOUS
  Filled 2018-03-13: qty 5

## 2018-03-13 MED ORDER — SODIUM CHLORIDE 0.9 % IJ SOLN
10.0000 mL | INTRAMUSCULAR | Status: DC | PRN
  Administered 2018-03-13: 10 mL via INTRAVENOUS
  Filled 2018-03-13: qty 10

## 2018-03-14 ENCOUNTER — Inpatient Hospital Stay: Payer: Medicare Other | Admitting: Oncology

## 2018-03-25 ENCOUNTER — Other Ambulatory Visit: Payer: Self-pay | Admitting: Internal Medicine

## 2018-03-25 DIAGNOSIS — I10 Essential (primary) hypertension: Secondary | ICD-10-CM

## 2018-03-26 ENCOUNTER — Telehealth: Payer: Self-pay | Admitting: Oncology

## 2018-03-26 ENCOUNTER — Inpatient Hospital Stay: Payer: Medicare Other | Attending: Oncology | Admitting: Oncology

## 2018-03-26 VITALS — BP 153/74 | HR 66 | Temp 98.1°F | Resp 17 | Ht 64.0 in | Wt 172.6 lb

## 2018-03-26 DIAGNOSIS — C50211 Malignant neoplasm of upper-inner quadrant of right female breast: Secondary | ICD-10-CM

## 2018-03-26 DIAGNOSIS — Z9011 Acquired absence of right breast and nipple: Secondary | ICD-10-CM | POA: Insufficient documentation

## 2018-03-26 DIAGNOSIS — C50911 Malignant neoplasm of unspecified site of right female breast: Secondary | ICD-10-CM | POA: Diagnosis present

## 2018-03-26 DIAGNOSIS — C7951 Secondary malignant neoplasm of bone: Secondary | ICD-10-CM | POA: Diagnosis not present

## 2018-03-26 DIAGNOSIS — Z171 Estrogen receptor negative status [ER-]: Secondary | ICD-10-CM | POA: Diagnosis not present

## 2018-03-26 DIAGNOSIS — B20 Human immunodeficiency virus [HIV] disease: Secondary | ICD-10-CM | POA: Insufficient documentation

## 2018-03-26 DIAGNOSIS — C7800 Secondary malignant neoplasm of unspecified lung: Secondary | ICD-10-CM

## 2018-03-26 DIAGNOSIS — C773 Secondary and unspecified malignant neoplasm of axilla and upper limb lymph nodes: Secondary | ICD-10-CM | POA: Insufficient documentation

## 2018-03-26 NOTE — Progress Notes (Signed)
Hematology and Oncology Follow Up Visit  Kelly Chavez [id number] 11/28/1945 72 y.o. 03/26/2018 12:11 PM [name], MDShelton, [name], MD   Principle Diagnosis: 72 year old woman with stage IV breast cancer diagnosed in 2018 with disease to the bone and lung.   She was diagnosed with stage IIB (T2 N1) right-sided breast cancer, ER PR negative HER-2 negative in 2015.  Prior Therapy:  She is status post biopsy on 04/09/2014 which confirmed the presence of invasive carcinoma with axillary lymph node involvement. She is status post right modified radical mastectomy and insertion of a Port-A-Cath done on 06/15/2014. Her final pathology revealed T4bN1a. Her tumor showed lymphovascular invasion with involvement of the nipple but not the nipple and dermal lymphatics. One out of 16 lymph nodes involved.  Current therapy: Under consideration to start Xeloda 500 mg twice a day for 2 weeks on and one-week off.  Interim History:  Kelly Chavez is here for a follow-up.  Since her last visit, she reports no major changes in her health.  She continues to be relatively asymptomatic from her cancer without any increased bone pain or dyspnea on exertion.  She remains ambulatory but moves very slowly with the help of a walker.  She does drive short distances including her appointment today.  Her appetite remained about the same and weight slightly down.    She has not reported any headaches or blurry vision or syncope.  She denies any alteration of mental status or syncope.  She does not report any fevers, chills or sweats.  He denies any chest pain, orthopnea or dyspnea on exertion.  She denied cough, wheezing or hemoptysis.  She she has not reported any nausea or vomiting or abdominal pain.  She denies any hematochezia or melena.  Has not reported any frequency urgency or hesitancy.Has not reported any bone pain or pathological fractures.  She does not report any skin rashes or lesions.  She does not report any  lymphadenopathy or petechiae.  Rest of the review of systems is negative.   Medications: I have reviewed the patient's current medications.  Current Outpatient Medications  Medication Sig Dispense Refill  . acetaminophen (TYLENOL) 325 MG tablet Take 2 tablets (650 mg total) by mouth every 6 (six) hours as needed for mild pain (or Fever >/= 101). 30 tablet 0  . atenolol (TENORMIN) 50 MG tablet Take 1 tablet (50 mg total) by mouth daily. 30 tablet 0  . bictegravir-emtricitabine-tenofovir AF (BIKTARVY) 50-200-25 MG TABS tablet Biktarvy 50 mg-200 mg-25 mg tablet    . capecitabine (XELODA) 500 MG tablet Take one tablet (500 mg) by mouth twice daily, within 30 minutes of finishing food. Take for 2 weeks on, one week off, repeat every 3 weeks. 28 tablet 0  . Multiple Vitamins-Minerals (AIRBORNE PO) Take 1 tablet by mouth daily.    Marland Kitchen thyroid (ARMOUR THYROID) 60 MG tablet Armour Thyroid 60 mg tablet    . Turmeric 500 MG TABS Take 1 tablet by mouth daily.     Current Facility-Administered Medications  Medication Dose Route Frequency Provider Last Rate Last Dose  . baclofen (LIORESAL) tablet 5 mg  5 mg Oral BID Garvin Fila, MD         Allergies: No Known Allergies  Past Medical History, Surgical history, Social history, and Family History unchanged at this time.  Physical Exam:  Blood pressure (!) 153/74, pulse 66, temperature 98.1 F (36.7 C), temperature source Oral, resp. rate 17, height 5' 4"  (1.626 m), weight 172 lb 9.6  oz (78.3 kg), SpO2 100 %.   ECOG: 1 General appearance: Alert, awake woman without distress. Head: Normocephalic without abnormalities. Oral mucosa: No oral ulcers or lesions. Eyes: Pupils are equal and round reactive to light. Lymph nodes: No cervical, axillary, inguinal or supraclavicular regions. Heart: S1 and S2 regular rate without any murmurs. Lung: Clear all lung fields without any rhonchi or wheezes.  No dullness to percussion. Abdomin: Soft, without any  rebound or guarding.  No shifting dullness or ascites. Musculoskeletal: No clubbing or cyanosis. Skin: Showed no rashes or lesions.  Lab Results: Lab Results  Component Value Date   WBC 4.5 03/13/2018   HGB 12.1 03/13/2018   HCT 38.8 03/13/2018   MCV 96.0 03/13/2018   PLT 192 03/13/2018     Chemistry      Component Value Date/Time   NA 142 03/13/2018 1427   NA 141 08/31/2017 1110   K 4.0 03/13/2018 1427   K 4.3 08/31/2017 1110   CL 108 03/13/2018 1427   CO2 23 03/13/2018 1427   CO2 24 08/31/2017 1110   BUN 17 03/13/2018 1427   BUN 21.6 08/31/2017 1110   CREATININE 0.98 03/13/2018 1427   CREATININE 1.5 (H) 08/31/2017 1110      Component Value Date/Time   CALCIUM 9.5 03/13/2018 1427   CALCIUM 9.4 08/31/2017 1110   ALKPHOS 122 03/13/2018 1427   ALKPHOS 104 08/31/2017 1110   AST 25 03/13/2018 1427   AST 32 08/31/2017 1110   ALT 22 03/13/2018 1427   ALT 43 08/31/2017 1110   BILITOT 0.3 03/13/2018 1427   BILITOT 0.26 08/31/2017 1110      IMPRESSION: 1. Interval progression of disease compared with 07/23/2017. 2. The large right supraclavicular nodal mass has increased in size in the interval. This appears to involve both the subclavian artery and vein. 3. New borderline right hilar, right mediastinal and subcarinal lymph nodes. 4. Increase in size of pulmonary metastasis 5. Two new sclerotic lesions are identified involving the lumbar spine and sacrum. 6.  Aortic Atherosclerosis (ICD10-I70.0).    Impression and Plan:  72 year old woman with the following issues:   1.  Stage IV breast cancer documented in 2019 with disease in the chest wall as well as the bone.  Her tumor is triple negative and refused adjuvant chemotherapy at the time of diagnosis in 2015.  She has not started on Xeloda at this time and remained hesitant overall.  The natural course of her disease was reviewed again as well as risks and benefits of starting anticancer therapy.  She  understands without treatment her disease will likely progress and will cause her more symptoms and deterioration.  She also understands that chemotherapy will offer some palliation but will not offer cure.  Her options will remain as discussed previously which is anticancer therapy versus supportive care only.  After discussion today, she is willing to proceed with Xeloda after July 4 holiday and we will arrange to start her therapy around that time.  2. IV access: Port-A-Cath veins in use and flushed periodically.  3. HIV: No changes in her medication under the care of Dr. Baxter Flattery.   4.  Prognosis: She has an incurable malignancy with disease that is progressing at this time.  Any treatment is palliative in nature and her overall prognosis is poor given her overall debilitation.  5. Follow-up: Will be 3 weeks after the start of her therapy to assess her tolerance to Xeloda.  15 minutes was spent face-to-face today with  the patient.  More than 50% of that time was spent counseling, education and coordinating her care as well as discussion of alternative treatment options.   Zola Button, MD 6/11/201912:11 PM

## 2018-03-26 NOTE — Telephone Encounter (Signed)
Scheduled appt per 6/11 los - per FS okay to schedule f/u in August per patient preference.

## 2018-04-20 ENCOUNTER — Other Ambulatory Visit: Payer: Self-pay | Admitting: Internal Medicine

## 2018-04-20 DIAGNOSIS — I10 Essential (primary) hypertension: Secondary | ICD-10-CM

## 2018-05-08 ENCOUNTER — Encounter: Payer: Self-pay | Admitting: Oncology

## 2018-05-10 ENCOUNTER — Telehealth: Payer: Self-pay | Admitting: Oncology

## 2018-05-10 NOTE — Telephone Encounter (Signed)
Son aware of appt change from 8/13 to 8/09 per my chart message -

## 2018-05-24 ENCOUNTER — Inpatient Hospital Stay (HOSPITAL_BASED_OUTPATIENT_CLINIC_OR_DEPARTMENT_OTHER): Payer: Medicare Other | Admitting: Oncology

## 2018-05-24 ENCOUNTER — Inpatient Hospital Stay: Payer: Medicare Other | Attending: Oncology

## 2018-05-24 ENCOUNTER — Telehealth: Payer: Self-pay | Admitting: Oncology

## 2018-05-24 ENCOUNTER — Other Ambulatory Visit: Payer: Self-pay | Admitting: *Deleted

## 2018-05-24 VITALS — BP 176/90 | HR 72 | Temp 98.2°F | Resp 20 | Ht 64.0 in | Wt 172.1 lb

## 2018-05-24 DIAGNOSIS — C7951 Secondary malignant neoplasm of bone: Secondary | ICD-10-CM | POA: Insufficient documentation

## 2018-05-24 DIAGNOSIS — C50211 Malignant neoplasm of upper-inner quadrant of right female breast: Secondary | ICD-10-CM

## 2018-05-24 DIAGNOSIS — C773 Secondary and unspecified malignant neoplasm of axilla and upper limb lymph nodes: Secondary | ICD-10-CM | POA: Insufficient documentation

## 2018-05-24 DIAGNOSIS — C50911 Malignant neoplasm of unspecified site of right female breast: Secondary | ICD-10-CM | POA: Diagnosis present

## 2018-05-24 DIAGNOSIS — Z9011 Acquired absence of right breast and nipple: Secondary | ICD-10-CM | POA: Diagnosis not present

## 2018-05-24 DIAGNOSIS — Z171 Estrogen receptor negative status [ER-]: Secondary | ICD-10-CM | POA: Insufficient documentation

## 2018-05-24 DIAGNOSIS — C7800 Secondary malignant neoplasm of unspecified lung: Secondary | ICD-10-CM | POA: Diagnosis not present

## 2018-05-24 DIAGNOSIS — Z95828 Presence of other vascular implants and grafts: Secondary | ICD-10-CM

## 2018-05-24 LAB — CMP (CANCER CENTER ONLY)
ALBUMIN: 3.4 g/dL — AB (ref 3.5–5.0)
ALK PHOS: 129 U/L — AB (ref 38–126)
ALT: 16 U/L (ref 0–44)
ANION GAP: 10 (ref 5–15)
AST: 20 U/L (ref 15–41)
BILIRUBIN TOTAL: 0.3 mg/dL (ref 0.3–1.2)
BUN: 13 mg/dL (ref 8–23)
CO2: 24 mmol/L (ref 22–32)
CREATININE: 1.08 mg/dL — AB (ref 0.44–1.00)
Calcium: 9.3 mg/dL (ref 8.9–10.3)
Chloride: 106 mmol/L (ref 98–111)
GFR, Est AFR Am: 58 mL/min — ABNORMAL LOW (ref >60)
GFR, Estimated: 50 mL/min — ABNORMAL LOW (ref >60)
Glucose, Bld: 81 mg/dL (ref 70–99)
Potassium: 4.1 mmol/L (ref 3.5–5.1)
SODIUM: 140 mmol/L (ref 135–145)
TOTAL PROTEIN: 8.1 g/dL (ref 6.5–8.1)

## 2018-05-24 LAB — CBC WITH DIFFERENTIAL (CANCER CENTER ONLY)
BASOS ABS: 0 10*3/uL (ref 0.0–0.1)
BASOS PCT: 1 %
EOS ABS: 0.1 10*3/uL (ref 0.0–0.5)
EOS PCT: 1 %
HEMATOCRIT: 39.2 % (ref 34.8–46.6)
Hemoglobin: 12.6 g/dL (ref 11.6–15.9)
Lymphocytes Relative: 31 %
Lymphs Abs: 1.4 10*3/uL (ref 0.9–3.3)
MCH: 30 pg (ref 25.1–34.0)
MCHC: 32.2 g/dL (ref 31.5–36.0)
MCV: 93.3 fL (ref 79.5–101.0)
MONOS PCT: 8 %
Monocytes Absolute: 0.4 10*3/uL (ref 0.1–0.9)
Neutro Abs: 2.7 10*3/uL (ref 1.5–6.5)
Neutrophils Relative %: 59 %
PLATELETS: 220 10*3/uL (ref 145–400)
RBC: 4.2 MIL/uL (ref 3.70–5.45)
RDW: 14 % (ref 11.2–14.5)
WBC Count: 4.5 10*3/uL (ref 3.9–10.3)

## 2018-05-24 MED ORDER — LIDOCAINE-PRILOCAINE 2.5-2.5 % EX CREA
TOPICAL_CREAM | CUTANEOUS | 1 refills | Status: AC
Start: 2018-05-24 — End: ?

## 2018-05-24 MED ORDER — HEPARIN SOD (PORK) LOCK FLUSH 100 UNIT/ML IV SOLN
500.0000 [IU] | Freq: Once | INTRAVENOUS | Status: AC
  Administered 2018-05-24: 500 [IU] via INTRAVENOUS
  Filled 2018-05-24: qty 5

## 2018-05-24 MED ORDER — SODIUM CHLORIDE 0.9 % IJ SOLN
10.0000 mL | Freq: Once | INTRAMUSCULAR | Status: AC
  Administered 2018-05-24: 10 mL via INTRAVENOUS
  Filled 2018-05-24: qty 10

## 2018-05-24 NOTE — Progress Notes (Signed)
Hematology and Oncology Follow Up Visit  Kelly Chavez [id number] [birth year] 72 y.o. 05/24/2018 11:35 AM Kelly Chavez, MDShel[place], [name], MD   Principle Diagnosis: 72 year old woman with stage IV breast cancer after initial diagnosis of localized disease in 2015 she was found to have metastatic disease to the lung and bone in 2018.  She had stage IIB (T2 N1) right-sided breast cancer, ER PR negative HER-2 negative in 2015.  Prior Therapy:  She is status post biopsy on 04/09/2014 which confirmed the presence of invasive carcinoma with axillary lymph node involvement. She is status post right modified radical mastectomy and insertion of a Port-A-Cath done on 06/15/2014. Her final pathology revealed T4bN1a. Her tumor showed lymphovascular invasion with involvement of the nipple but not the nipple and dermal lymphatics. One out of 16 lymph nodes involved.  Current therapy: Xeloda 500 mg twice a day for 2 weeks on and one-week off.  Therapy has not been started and the patient keep deferring at this time.  Interim History:  [name] presents today for a follow-up visit.  Since the last visit, he reports no major clinical changes.  She does report some mild cough and mild dyspnea and overall same level of fatigue.  She ambulates with the help of a walker without any falls or syncope.  She has not started Xeloda at this time citing family reasons.  She reports that she wants to attend a family gathering and would like to avoid any treatment prior to that.  Her appetite remains about the same without any decline in her performance status and quality of life.    She has denied any headaches or blurry vision or syncope.  She denies any numbness or confusion.  She does not report any fevers, chills or sweats.  He denies any chest pai.  She denied  wheezing or hemoptysis.  She she has not reported any nausea or vomiting or abdominal pain.  She denies any hematochezia or melena.  Has not reported any  frequency urgency or hesitancy. Has not reported arthralgias or myalgias.  She does not report any skin rashes or lesions.  She does not report any lymphadenopathy or petechiae.  He denies any bleeding or clotting tendencies.  Rest of the review of systems is negative.   Medications: I have reviewed the patient's current medications.  Current Outpatient Medications  Medication Sig Dispense Refill  . acetaminophen (TYLENOL) 325 MG tablet Take 2 tablets (650 mg total) by mouth every 6 (six) hours as needed for mild pain (or Fever >/= 101). 30 tablet 0  . atenolol (TENORMIN) 50 MG tablet TAKE 1 TABLET BY MOUTH EVERY DAY 15 tablet 0  . bictegravir-emtricitabine-tenofovir AF (BIKTARVY) 50-200-25 MG TABS tablet Biktarvy 50 mg-200 mg-25 mg tablet    . capecitabine (XELODA) 500 MG tablet Take one tablet (500 mg) by mouth twice daily, within 30 minutes of finishing food. Take for 2 weeks on, one week off, repeat every 3 weeks. 28 tablet 0  . Multiple Vitamins-Minerals (AIRBORNE PO) Take 1 tablet by mouth daily.    Marland Kitchen thyroid (ARMOUR THYROID) 60 MG tablet Armour Thyroid 60 mg tablet    . Turmeric 500 MG TABS Take 1 tablet by mouth daily.     Current Facility-Administered Medications  Medication Dose Route Frequency Provider Last Rate Last Dose  . baclofen (LIORESAL) tablet 5 mg  5 mg Oral BID Garvin Fila, MD         Allergies: No Known Allergies  Past Medical History, Surgical  history, Social history, and Family History unchanged at this time.  Physical Exam:  Blood pressure (!) 176/90, pulse 72, temperature 98.2 F (36.8 C), temperature source Oral, resp. rate 20, height _0  (1.626 m), weight 172 lb 1.6 oz (78.1 kg), SpO2 100 %.    ECOG: 1 General appearance: Alert, awake woman with flat affect appeared in no distress but anxious. Head: Normocephalic without abnormalities. Oral mucosa: His membranes are moist and pink. Eyes: Sclera anicteric. Lymph nodes: No cervical, axillary,  inguinal or supraclavicular adenopathy noted. Heart: Regular rate without any murmurs.  Mild leg edema noted. Lung: Clear without any rhonchi, wheezes or dullness to percussion. Abdomin: Soft, without any rebound or guarding.  No shifting dullness or ascites. Musculoskeletal: No clubbing or cyanosis. Skin: Moist without any ecchymosis or petechiae.  Lab Results: Lab Results  Component Value Date   WBC 4.5 03/13/2018   HGB 12.1 03/13/2018   HCT 38.8 03/13/2018   MCV 96.0 03/13/2018   PLT 192 03/13/2018     Chemistry      Component Value Date/Time   NA 142 03/13/2018 1427   NA 141 08/31/2017 1110   K 4.0 03/13/2018 1427   K 4.3 08/31/2017 1110   CL 108 03/13/2018 1427   CO2 23 03/13/2018 1427   CO2 24 08/31/2017 1110   BUN 17 03/13/2018 1427   BUN 21.6 08/31/2017 1110   CREATININE 0.98 03/13/2018 1427   CREATININE 1.5 (H) 08/31/2017 1110      Component Value Date/Time   CALCIUM 9.5 03/13/2018 1427   CALCIUM 9.4 08/31/2017 1110   ALKPHOS 122 03/13/2018 1427   ALKPHOS 104 08/31/2017 1110   AST 25 03/13/2018 1427   AST 32 08/31/2017 1110   ALT 22 03/13/2018 1427   ALT 43 08/31/2017 1110   BILITOT 0.3 03/13/2018 1427   BILITOT 0.26 08/31/2017 1110         Impression and Plan:  72 year old woman with:  1.  Stage IV breast cancer after initial diagnosis in 2015 developed documented metastasis to the chest wall as well as the lung in 2019.  The natural course of this disease was reviewed today with the patient and her son.  This has been done repeatedly and she remains hesitant to receive any treatment.  It is unclear exactly where her hesitation is mostly centered around toxicities.  I reiterated the complications and risks and benefits associated with Xeloda today.  I also outlined to her the consequences of not treating this cancer that is continued to progress.  I have discussed in detail the complication associated with this cancer without treatment.  Issues like  pain, dyspnea and failure to thrive will likely happen as his cancer progresses without any treatment.  After discussion today she is still not sure whether she was to proceed with Xeloda.  She would like to attend a family event in the upcoming weeks and she will consider that option afterwards.  I have highly recommended that she start the Xeloda as soon as she returns otherwise the window of opportunity to treat her cancer may be closing.   2. IV access: Port-A-Cath will continue to be flushed with every visit.  3.  Prognosis: This was discussed again in detail with her and her son.  Her disease is incurable and any treatment at this time is palliative.  4. Follow-up: Will be in 8 weeks based on her wishes for a flush and follow her clinical status.  15 minutes was spent face-to-face today  with the patient.  More than 50% of that time was spent discussing the natural course of her disease, treatment options and consequences of treating or not treating her cancer.   Zola Button, MD 8/9/201911:35 AM

## 2018-05-24 NOTE — Patient Instructions (Signed)

## 2018-05-24 NOTE — Telephone Encounter (Signed)
The patient has declined a copy of the AVS per 8/9 NO other LOS

## 2018-05-28 ENCOUNTER — Ambulatory Visit: Payer: Medicare Other | Admitting: Oncology

## 2018-05-28 ENCOUNTER — Other Ambulatory Visit: Payer: Medicare Other

## 2018-06-05 ENCOUNTER — Other Ambulatory Visit: Payer: Self-pay | Admitting: Internal Medicine

## 2018-06-05 DIAGNOSIS — I10 Essential (primary) hypertension: Secondary | ICD-10-CM

## 2018-06-10 ENCOUNTER — Other Ambulatory Visit: Payer: Self-pay | Admitting: Internal Medicine

## 2018-06-10 ENCOUNTER — Telehealth: Payer: Self-pay

## 2018-06-10 NOTE — Telephone Encounter (Signed)
Called patient to schedule a follow- up appointment with Dr. Baxter Flattery. Left a generic voice mail for patient to call office. [place]

## 2018-06-21 ENCOUNTER — Emergency Department (HOSPITAL_COMMUNITY): Payer: Medicare Other

## 2018-06-21 ENCOUNTER — Observation Stay (HOSPITAL_COMMUNITY)
Admission: EM | Admit: 2018-06-21 | Discharge: 2018-06-24 | Disposition: A | Discharge status: Home / Self Care | Payer: Medicare Other | Attending: Internal Medicine | Admitting: Internal Medicine

## 2018-06-21 ENCOUNTER — Other Ambulatory Visit: Payer: Self-pay

## 2018-06-21 ENCOUNTER — Encounter (HOSPITAL_COMMUNITY): Payer: Self-pay

## 2018-06-21 DIAGNOSIS — R946 Abnormal results of thyroid function studies: Secondary | ICD-10-CM | POA: Insufficient documentation

## 2018-06-21 DIAGNOSIS — E876 Hypokalemia: Secondary | ICD-10-CM | POA: Diagnosis not present

## 2018-06-21 DIAGNOSIS — B20 Human immunodeficiency virus [HIV] disease: Secondary | ICD-10-CM | POA: Diagnosis present

## 2018-06-21 DIAGNOSIS — R2681 Unsteadiness on feet: Secondary | ICD-10-CM | POA: Diagnosis not present

## 2018-06-21 DIAGNOSIS — Z8673 Personal history of transient ischemic attack (TIA), and cerebral infarction without residual deficits: Secondary | ICD-10-CM | POA: Diagnosis not present

## 2018-06-21 DIAGNOSIS — I10 Essential (primary) hypertension: Secondary | ICD-10-CM | POA: Diagnosis not present

## 2018-06-21 DIAGNOSIS — C50211 Malignant neoplasm of upper-inner quadrant of right female breast: Secondary | ICD-10-CM | POA: Diagnosis present

## 2018-06-21 DIAGNOSIS — Z9049 Acquired absence of other specified parts of digestive tract: Secondary | ICD-10-CM | POA: Diagnosis not present

## 2018-06-21 DIAGNOSIS — R531 Weakness: Secondary | ICD-10-CM | POA: Diagnosis not present

## 2018-06-21 DIAGNOSIS — R8271 Bacteriuria: Secondary | ICD-10-CM | POA: Insufficient documentation

## 2018-06-21 DIAGNOSIS — Z853 Personal history of malignant neoplasm of breast: Secondary | ICD-10-CM | POA: Insufficient documentation

## 2018-06-21 DIAGNOSIS — R1110 Vomiting, unspecified: Secondary | ICD-10-CM

## 2018-06-21 DIAGNOSIS — Z79890 Hormone replacement therapy (postmenopausal): Secondary | ICD-10-CM | POA: Insufficient documentation

## 2018-06-21 DIAGNOSIS — Z9011 Acquired absence of right breast and nipple: Secondary | ICD-10-CM | POA: Insufficient documentation

## 2018-06-21 DIAGNOSIS — R748 Abnormal levels of other serum enzymes: Secondary | ICD-10-CM | POA: Diagnosis not present

## 2018-06-21 DIAGNOSIS — M6282 Rhabdomyolysis: Secondary | ICD-10-CM | POA: Diagnosis present

## 2018-06-21 DIAGNOSIS — R5383 Other fatigue: Secondary | ICD-10-CM | POA: Diagnosis not present

## 2018-06-21 DIAGNOSIS — R778 Other specified abnormalities of plasma proteins: Secondary | ICD-10-CM | POA: Diagnosis present

## 2018-06-21 DIAGNOSIS — C7951 Secondary malignant neoplasm of bone: Secondary | ICD-10-CM | POA: Diagnosis not present

## 2018-06-21 DIAGNOSIS — G8110 Spastic hemiplegia affecting unspecified side: Secondary | ICD-10-CM

## 2018-06-21 DIAGNOSIS — E860 Dehydration: Secondary | ICD-10-CM | POA: Diagnosis not present

## 2018-06-21 DIAGNOSIS — Z21 Asymptomatic human immunodeficiency virus [HIV] infection status: Secondary | ICD-10-CM | POA: Diagnosis not present

## 2018-06-21 DIAGNOSIS — Z66 Do not resuscitate: Secondary | ICD-10-CM | POA: Diagnosis not present

## 2018-06-21 DIAGNOSIS — C7800 Secondary malignant neoplasm of unspecified lung: Secondary | ICD-10-CM | POA: Diagnosis not present

## 2018-06-21 DIAGNOSIS — Z79899 Other long term (current) drug therapy: Secondary | ICD-10-CM | POA: Diagnosis not present

## 2018-06-21 DIAGNOSIS — R7989 Other specified abnormal findings of blood chemistry: Secondary | ICD-10-CM

## 2018-06-21 DIAGNOSIS — E785 Hyperlipidemia, unspecified: Secondary | ICD-10-CM | POA: Diagnosis present

## 2018-06-21 DIAGNOSIS — Z85038 Personal history of other malignant neoplasm of large intestine: Secondary | ICD-10-CM | POA: Insufficient documentation

## 2018-06-21 LAB — CBC WITH DIFFERENTIAL/PLATELET
BASOS ABS: 0 10*3/uL (ref 0.0–0.1)
Basophils Relative: 0 %
EOS PCT: 0 %
Eosinophils Absolute: 0 10*3/uL (ref 0.0–0.7)
HCT: 40 % (ref 36.0–46.0)
Hemoglobin: 13.2 g/dL (ref 12.0–15.0)
LYMPHS ABS: 0.8 10*3/uL (ref 0.7–4.0)
LYMPHS PCT: 12 %
MCH: 30.8 pg (ref 26.0–34.0)
MCHC: 33 g/dL (ref 30.0–36.0)
MCV: 93.5 fL (ref 78.0–100.0)
MONO ABS: 0.7 10*3/uL (ref 0.1–1.0)
MONOS PCT: 10 %
Neutro Abs: 5.1 10*3/uL (ref 1.7–7.7)
Neutrophils Relative %: 78 %
PLATELETS: 219 10*3/uL (ref 150–400)
RBC: 4.28 MIL/uL (ref 3.87–5.11)
RDW: 13.5 % (ref 11.5–15.5)
WBC: 6.6 10*3/uL (ref 4.0–10.5)

## 2018-06-21 LAB — URINALYSIS, ROUTINE W REFLEX MICROSCOPIC
Bilirubin Urine: NEGATIVE
Glucose, UA: NEGATIVE mg/dL
Ketones, ur: 5 mg/dL — AB
Nitrite: POSITIVE — AB
PH: 6 (ref 5.0–8.0)
Protein, ur: NEGATIVE mg/dL
SPECIFIC GRAVITY, URINE: 1.009 (ref 1.005–1.030)

## 2018-06-21 LAB — TROPONIN I: TROPONIN I: 0.03 ng/mL — AB (ref <0.03)

## 2018-06-21 LAB — COMPREHENSIVE METABOLIC PANEL
ALT: 22 U/L (ref 0–44)
AST: 37 U/L (ref 15–41)
Albumin: 3.8 g/dL (ref 3.5–5.0)
Alkaline Phosphatase: 123 U/L (ref 38–126)
Anion gap: 12 (ref 5–15)
BUN: 14 mg/dL (ref 8–23)
CHLORIDE: 107 mmol/L (ref 98–111)
CO2: 24 mmol/L (ref 22–32)
CREATININE: 0.98 mg/dL (ref 0.44–1.00)
Calcium: 9.5 mg/dL (ref 8.9–10.3)
GFR calc Af Amer: >60 mL/min (ref >60)
GFR calc non Af Amer: 57 mL/min — ABNORMAL LOW (ref >60)
Glucose, Bld: 106 mg/dL — ABNORMAL HIGH (ref 70–99)
Potassium: 3.5 mmol/L (ref 3.5–5.1)
Sodium: 143 mmol/L (ref 135–145)
Total Bilirubin: 0.6 mg/dL (ref 0.3–1.2)
Total Protein: 7.7 g/dL (ref 6.5–8.1)

## 2018-06-21 LAB — I-STAT CHEM 8, ED
BUN: 15 mg/dL (ref 8–23)
CALCIUM ION: 1.12 mmol/L — AB (ref 1.15–1.40)
Chloride: 105 mmol/L (ref 98–111)
Creatinine, Ser: 0.9 mg/dL (ref 0.44–1.00)
Glucose, Bld: 107 mg/dL — ABNORMAL HIGH (ref 70–99)
HCT: 39 % (ref 36.0–46.0)
HEMOGLOBIN: 13.3 g/dL (ref 12.0–15.0)
Potassium: 3.6 mmol/L (ref 3.5–5.1)
Sodium: 141 mmol/L (ref 135–145)
TCO2: 26 mmol/L (ref 22–32)

## 2018-06-21 LAB — CBG MONITORING, ED: GLUCOSE-CAPILLARY: 95 mg/dL (ref 70–99)

## 2018-06-21 LAB — CK: Total CK: 891 U/L — ABNORMAL HIGH (ref 38–234)

## 2018-06-21 MED ORDER — SODIUM CHLORIDE 0.9 % IV BOLUS
1000.0000 mL | Freq: Once | INTRAVENOUS | Status: AC
  Administered 2018-06-21: 1000 mL via INTRAVENOUS

## 2018-06-21 MED ORDER — ASPIRIN 81 MG PO CHEW
324.0000 mg | CHEWABLE_TABLET | Freq: Once | ORAL | Status: AC
  Administered 2018-06-21: 324 mg via ORAL
  Filled 2018-06-21: qty 4

## 2018-06-21 NOTE — ED Provider Notes (Signed)
[facility] DEPT Provider Note   CSN: [id number] Arrival date & time: 06/21/18  1836     History   Chief Complaint Chief Complaint  Patient presents with  . Weakness  . Emesis    HPI Kelly Chavez is a 72 y.o. female.  HPI  72 year old female with a history of HIV, right breast carcinoma, sigmoid colon cancer and brain tumor presents with weakness and inability to get up.  The patient has had progressive weakness for a while.  She usually uses a walker and sits in a walker.  Often has trouble standing up after falling and since early this morning has been unable to get up after slipping.  She does not think she injured herself but after multiple attempts to get up she could not get up and kept falling.  She is been laying on the ground and has been urinating on herself.  EMS was called and she came in because she is now starting to have some vomiting.  She has a little bit of a headache that she thinks is from frustration from the whole event.  No neck pain, back pain, abdominal pain or chest pain.  She has had a cough for 3 weeks with clear sputum.  No fevers or chills.  No urinary symptoms.  She feels generally weak and has generalized body aches but these are progressive and chronic symptoms.  Past Medical History:  Diagnosis Date  . Anxiety   . Avascular necrosis of bones of both hips ([place])   . Brain tumor (benign) ([place])   . Cancer of sigmoid colon [facility]) 2011   sigmoid colectomy   . Complication of anesthesia    senitive to all  . HIV (human immunodeficiency virus infection) ([place])   . Hypertension   . Invasive ductal carcinoma of right breast ([place]) 04/09/14  . 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 Status post chemotherapy 2012  . Stroke [facility])     Patient Active Problem List   Diagnosis Date Noted  . Dehydration 06/21/2018  . Rhabdomyolysis 06/21/2018  . Elevated troponin 06/21/2018  . Bilateral lower leg cellulitis 09/20/2017    . Weakness 09/20/2017  . Port catheter in place 06/28/2016  . 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 Acute CVA (cerebrovascular accident) ([place]) 09/14/2015  . CVA (cerebral infarction) 09/13/2015  . Hyperlipidemia 05/31/2014  . Pedal edema 05/11/2014  . Paranoia ([place]) 05/11/2014  . Breast cancer of upper-inner quadrant of right female breast ([place]) 04/15/2014  . History of pituitary tumor 01/28/2014  . Avascular necrosis of bones of both hips ([place]) 01/28/2014  . Genital herpes 01/28/2014  . Ovarian cyst, bilateral 01/26/2014  . Essential hypertension, benign 01/26/2014  . Unspecified vitamin D deficiency 01/26/2014  . DJD (degenerative joint disease) 01/26/2014  . HIV disease ([place]) 01/07/2014    Past Surgical History:  Procedure Laterality Date  . ABDOMINAL HYSTERECTOMY    . COLON SURGERY     partial colectomy  . MASTECTOMY MODIFIED RADICAL Right 06/15/2014   Procedure: RIGHT MODIFIED RADICAL MASTECTOMY;  Surgeon: Shann Medal, MD;  Location: [facility];  Service: General;  Laterality: Right;  . PORTACATH PLACEMENT N/A 06/15/2014   Procedure: INSERTION PORT-A-CATH LEFT SUBCLAVIAN;  Surgeon: Shann Medal, MD;  Location: [facility];  Service: General;  Laterality: N/A;  . TONSILLECTOMY    . TOTAL ABDOMINAL HYSTERECTOMY W/ BILATERAL SALPINGOOPHORECTOMY  2012  OB History    Gravida  1   Para      Term      Preterm      AB      Living        SAB      TAB      Ectopic      Multiple      Live Births               Home Medications    Prior to Admission medications   Medication Sig Start Date End Date Taking? Authorizing Provider  acetaminophen (TYLENOL) 325 MG tablet Take 2 tablets (650 mg total) by mouth every 6 (six) hours as needed for mild pain (or Fever >/= 101). 09/22/17  Yes Robbie Lis, MD  atenolol (TENORMIN) 50 MG tablet TAKE 1 TABLET BY MOUTH EVERY DAY  04/24/18  Yes [name], MD  bictegravir-emtricitabine-tenofovir AF (BIKTARVY) 50-200-25 MG TABS tablet Take 1 tablet by mouth daily.    Yes [provider]  Cholecalciferol (VITAMIN D PO) Take 1 tablet by mouth daily.   Yes [provider]  lidocaine-prilocaine (EMLA) cream Apply a dime size to port 1-2 hours prior to access. Cover with [employer]. 05/24/18  Yes Wyatt Portela, MD  Multiple Vitamins-Minerals (AIRBORNE PO) Take 1 tablet by mouth daily.   Yes [provider]  capecitabine (XELODA) 500 MG tablet Take one tablet (500 mg) by mouth twice daily, within 30 minutes of finishing food. Take for 2 weeks on, one week off, repeat every 3 weeks. Patient not taking: Reported on 06/21/2018 02/13/18   Wyatt Portela, MD  thyroid [facility] THYROID) 60 MG tablet Take 60 mg by mouth daily before breakfast.     [provider]    Family History Family History  Problem Relation Age of Onset  . Diabetes Father   . Hyperlipidemia Father   . Stroke Father   . Hypertension Mother   . Hyperlipidemia Mother   . Dementia Mother   . Diabetes Sister   . Diabetes Brother   . Stroke Brother     Social History Social History   Tobacco Use  . Smoking status: Never Smoker  . Smokeless tobacco: Never Used  Substance Use Topics  . Alcohol use: No    Alcohol/week: 0.0 standard drinks  . Drug use: No     Allergies   Patient has no known allergies.   Review of Systems Review of Systems  Constitutional: Negative for fever.  Respiratory: Negative for shortness of breath.   Cardiovascular: Negative for chest pain.  Gastrointestinal: Positive for vomiting. Negative for abdominal pain and diarrhea.  Genitourinary: Negative for dysuria and hematuria.  Musculoskeletal: Negative for back pain and neck pain.  Neurological: Positive for weakness and headaches. Negative for dizziness.  All other systems reviewed and are negative.    Physical Exam Updated Vital  Signs BP 140/75   Pulse 87   Temp 97.8 F (36.6 C) (Oral)   Resp 18   Ht 5' 4.5" (1.638 m)   Wt 78.9 kg   SpO2 100%   BMI 29.41 kg/m   Physical Exam  Constitutional: She is oriented to person, place, and time. She appears well-developed and well-nourished. No distress.  Frequently spitting up  HENT:  Head: Normocephalic and atraumatic.  Right Ear: External ear normal.  Left Ear: External ear normal.  Nose: Nose normal.  Eyes: Right eye exhibits no discharge. Left eye exhibits no discharge.  Neck: Neck supple.  Cardiovascular: Normal rate, regular rhythm and normal heart sounds.  Pulmonary/Chest: Effort normal and breath sounds normal.  Abdominal: Soft. There is no tenderness.  Neurological: She is alert and oriented to person, place, and time.  CN 3-12 grossly intact. 5/5 strength in BUE. 4/5 strength in BLE.   Skin: Skin is warm and dry. She is not diaphoretic.  Chronic skin changes to BLE, but no warmth or current cellulitis  Nursing note and vitals reviewed.    ED Treatments / Results  Labs (all labs ordered are listed, but only abnormal results are displayed) Labs Reviewed  COMPREHENSIVE METABOLIC PANEL - Abnormal; Notable for the following components:      Result Value   Glucose, Bld 106 (*)    GFR calc non Af Amer 57 (*)    All other components within normal limits  TROPONIN I - Abnormal; Notable for the following components:   Troponin I 0.03 (*)    All other components within normal limits  CK - Abnormal; Notable for the following components:   Total CK 891 (*)    All other components within normal limits  URINALYSIS, ROUTINE W REFLEX MICROSCOPIC - Abnormal; Notable for the following components:   Hgb urine dipstick SMALL (*)    Ketones, ur 5 (*)    Nitrite POSITIVE (*)    Leukocytes, UA TRACE (*)    Bacteria, UA RARE (*)    All other components within normal limits  I-STAT CHEM 8, ED - Abnormal; Notable for the following components:   Glucose, Bld 107  (*)    Calcium, Ion 1.12 (*)    All other components within normal limits  URINE CULTURE  CBC WITH DIFFERENTIAL/PLATELET  MAGNESIUM  T-HELPER CELLS (CD4) COUNT (NOT AT [facility])  HIV 1 RNA QUANT-NO REFLEX-BLD  PHOSPHORUS  CK  TROPONIN I  TROPONIN I  TROPONIN I  CBG MONITORING, ED    EKG EKG Interpretation  Date/Time:  Friday June 21 2018 19:45:27 EDT Ventricular Rate:  86 PR Interval:    QRS Duration: 75 QT Interval:  409 QTC Calculation: 490 R Axis:   -159 Text Interpretation:  Sinus rhythm Borderline prolonged PR interval nonspecific T waves. Confirmed by Sherwood Gambler [phone]) on 06/21/2018 8:19:33 PM   Radiology Dg Chest 2 View  Result Date: 06/21/2018 CLINICAL DATA:  Generalized weakness and emesis.  Cough for 3 weeks. EXAM: CHEST - 2 VIEW COMPARISON:  CT chest 02/08/2018 FINDINGS: The heart size is upper limits of normal. It is exaggerated by low lung volumes. A left subclavian Port-A-Cath is in place. The tip is at the cavoatrial junction. Surgical clips are present at the right axilla. Lungs are clear. No focal nodule, mass, or airspace disease is present. The visualized soft tissues and bony thorax are unremarkable otherwise. IMPRESSION: 1. Borderline cardiomegaly without failure. 2. No acute cardiopulmonary disease. 3. Left subclavian Port-A-Cath terminates at the cavoatrial junction. Electronically Signed   By: San Morelle M.D.   On: 06/21/2018 20:30   Ct Head Wo Contrast  Result Date: 06/21/2018 CLINICAL DATA:  Generalized weakness, vomiting and incontinence. The patient slid to the floor earlier today and was unable to get up. Stage IV breast cancer. EXAM: CT HEAD WITHOUT CONTRAST CT CERVICAL SPINE WITHOUT CONTRAST TECHNIQUE: Multidetector CT imaging of the head and cervical spine was performed following the standard protocol without intravenous contrast. Multiplanar CT image reconstructions of the cervical spine were also generated. COMPARISON:  PET-CT dated  08/31/2017. Brain MR dated  09/13/2015. Head CT dated 09/13/2015. FINDINGS: CT HEAD FINDINGS Brain: Stable mildly enlarged ventricles and cortical sulci. Stable mild patchy white matter low density in both cerebral hemispheres. No intracranial hemorrhage, mass lesion or CT evidence of acute infarction. Vascular: No hyperdense vessel or unexpected calcification. Skull: Bilateral hyperostosis frontalis. Sinuses/Orbits: Unremarkable. Other: None. CT CERVICAL SPINE FINDINGS Alignment: Reversal the normal lordosis. No subluxations. Skull base and vertebrae: No acute fracture. No primary bone lesion or focal pathologic process. Soft tissues and spinal canal: No prevertebral fluid or swelling. No visible canal hematoma. Disc levels: Multilevel degenerative changes. These are producing varying degrees of spinal stenosis at multiple levels. Upper chest: The previously demonstrated right upper lobe lung nodule is partially included and appears somewhat more irregular. The included portion measures 1.6 cm in maximum diameter, previously 1.3 cm on 02/08/2018. The previously demonstrated conglomeration of right supraclavicular lymph nodes is partially included and may be larger. Two interval sclerotic lesions in the right 1st rib. Other: Bilateral carotid artery calcifications. IMPRESSION: 1. No skull fracture or intracranial hemorrhage. 2. No cervical spine fracture or subluxation. 3. Stable atrophy and chronic small vessel white matter ischemic changes. 4. Multilevel cervical spine degenerative changes with varying degrees of spinal stenosis at multiple levels. 5. Interval mild enlargement of the previously demonstrated right upper lobe lung nodule, suspicious for a metastasis or primary lung cancer. 6. Probable mild increase in size of a conglomeration of enlarged right supraclavicular nodes, compatible with metastatic nodes. 7. Two new sclerotic lesions in the right 1st rib, compatible with metastases. 8. Bilateral carotid  artery atheromatous calcifications. Electronically Signed   By: Claudie Revering M.D.   On: 06/21/2018 21:05   Ct Cervical Spine Wo Contrast  Result Date: 06/21/2018 CLINICAL DATA:  Generalized weakness, vomiting and incontinence. The patient slid to the floor earlier today and was unable to get up. Stage IV breast cancer. EXAM: CT HEAD WITHOUT CONTRAST CT CERVICAL SPINE WITHOUT CONTRAST TECHNIQUE: Multidetector CT imaging of the head and cervical spine was performed following the standard protocol without intravenous contrast. Multiplanar CT image reconstructions of the cervical spine were also generated. COMPARISON:  PET-CT dated 08/31/2017. Brain MR dated 09/13/2015. Head CT dated 09/13/2015. FINDINGS: CT HEAD FINDINGS Brain: Stable mildly enlarged ventricles and cortical sulci. Stable mild patchy white matter low density in both cerebral hemispheres. No intracranial hemorrhage, mass lesion or CT evidence of acute infarction. Vascular: No hyperdense vessel or unexpected calcification. Skull: Bilateral hyperostosis frontalis. Sinuses/Orbits: Unremarkable. Other: None. CT CERVICAL SPINE FINDINGS Alignment: Reversal the normal lordosis. No subluxations. Skull base and vertebrae: No acute fracture. No primary bone lesion or focal pathologic process. Soft tissues and spinal canal: No prevertebral fluid or swelling. No visible canal hematoma. Disc levels: Multilevel degenerative changes. These are producing varying degrees of spinal stenosis at multiple levels. Upper chest: The previously demonstrated right upper lobe lung nodule is partially included and appears somewhat more irregular. The included portion measures 1.6 cm in maximum diameter, previously 1.3 cm on 02/08/2018. The previously demonstrated conglomeration of right supraclavicular lymph nodes is partially included and may be larger. Two interval sclerotic lesions in the right 1st rib. Other: Bilateral carotid artery calcifications. IMPRESSION: 1. No skull  fracture or intracranial hemorrhage. 2. No cervical spine fracture or subluxation. 3. Stable atrophy and chronic small vessel white matter ischemic changes. 4. Multilevel cervical spine degenerative changes with varying degrees of spinal stenosis at multiple levels. 5. Interval mild enlargement of the previously demonstrated right upper lobe lung nodule, suspicious for  a metastasis or primary lung cancer. 6. Probable mild increase in size of a conglomeration of enlarged right supraclavicular nodes, compatible with metastatic nodes. 7. Two new sclerotic lesions in the right 1st rib, compatible with metastases. 8. Bilateral carotid artery atheromatous calcifications. Electronically Signed   By: Claudie Revering M.D.   On: 06/21/2018 21:05   Dg Abd 2 Views  Result Date: 06/21/2018 CLINICAL DATA:  Vomiting and in an adult. EXAM: ABDOMEN - 2 VIEW COMPARISON:  CT abdomen and pelvis FINDINGS: Bowel gas pattern is normal. There is no obstruction or free air. 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 IMPRESSION: 1. Normal bowel gas pattern. 2. Acetabular protrusio bilaterally. Electronically Signed   By: San Morelle M.D.   On: 06/21/2018 20:33    Procedures Procedures (including critical care time)  Medications Ordered in ED Medications  sodium chloride 0.9 % bolus 1,000 mL (0 mLs Intravenous Stopped 06/21/18 2220)  aspirin chewable tablet 324 mg (324 mg Oral Given 06/21/18 2224)     Initial Impression / Assessment and Plan / ED Course  I have reviewed the triage vital signs and the nursing notes.  Pertinent labs & imaging results that were available during my care of the patient were reviewed by me and considered in my medical decision making (see chart for details).     Patient with generalized weakness. Has nonspecific low elevated troponin with nonspecific T waves but no obvious ischemia. No CP/dyspnea or hypoxia. Urine is abnormal, questionable  for UTI. Send for culture. Mild rhabdo, given IV fluids. Overall probably needs more IV fluids and lives at home alone. Cannot get up to walk without assistance, but from there can walk. Thus, hospitalist consulted. Dr Roel Cluck to see patient.  Final Clinical Impressions(s) / ED Diagnoses   Final diagnoses:  Vomiting in adult    ED Discharge Orders    None       Sherwood Gambler, MD 06/21/18 2316

## 2018-06-21 NOTE — ED Notes (Signed)
Patient transported to X-ray 

## 2018-06-21 NOTE — ED Notes (Signed)
EKG given to Pickering,MD., for review.

## 2018-06-21 NOTE — ED Notes (Signed)
Pt ambulated to the door of her room using the walker. She needed 2 person assist getting out of a bed

## 2018-06-21 NOTE — ED Notes (Signed)
Pt given a turkey sandwich and water.

## 2018-06-21 NOTE — ED Triage Notes (Signed)
Patient BIB [facility] from home for generalized weakness and emesis. Pt slid into the floor earlier today but was unable to get self up. EMS pt incontinent multiple times bc she was too weak to make it to restroom. Pt has had at least 2 episodes of vomiting that started in the past 2 hours. Patient is a/o x4. Refused zofran that was offered by EMS. Pt is a former cancer pt.

## 2018-06-21 NOTE — ED Notes (Signed)
ED TO INPATIENT HANDOFF REPORT  Name/Age/Gender Kelly Chavez 72 y.o. female  Code Status Code Status History    Date Active Date Inactive Code Status Order ID Comments User Context   09/20/2017 1802 09/22/2017 1952 Full Code [id number]  Donne Hazel, MD ED   09/13/2015 2045 09/15/2015 2145 Full Code [id number]  Etta Quill, DO Inpatient   06/15/2014 1653 06/16/2014 2153 Full Code [id number]  Alphonsa Overall, MD Inpatient    Advance Directive Documentation     Most Recent Value  Type of Advance Directive  Healthcare Power of Attorney  Pre-existing out of facility DNR order (yellow form or pink MOST form)  -  "MOST" Form in Place?  -      Home/SNF/Other Home  Chief Complaint weakness/emesis  Level of Care/Admitting Diagnosis ED Disposition    ED Disposition Condition [place]: [facility] [100102]  Level of Care: Telemetry [5]  Admit to tele based on following criteria: Monitor for Ischemic changes  Diagnosis: Rhabdomyolysis [728.88.ICD-9-CM]  Admitting Physician: Toy Baker [[id number]]  Attending Physician: Toy Baker [[id number]]  PT Class (Do Not Modify): Observation [104]  PT Acc Code (Do Not Modify): Observation [10022]       Medical History Past Medical History:  Diagnosis Date  . Anxiety   . Avascular necrosis of bones of both hips ([place])   . Brain tumor (benign) ([place])   . Cancer of sigmoid colon [facility]) 2011   sigmoid colectomy   . Complication of anesthesia    senitive to all  . HIV (human immunodeficiency virus infection) ([place])   . Hypertension   . 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 Status post chemotherapy 2012  . Stroke [facility])     Allergies No Known Allergies  IV Location/Drains/Wounds Patient Lines/Drains/Airways Status   Active Line/Drains/Airways    Name:   Placement date:   Placement time:   Site:   Days:   Implanted  Port 06/15/14 Left Chest   06/15/14    1403    Chest   1467   Implanted Port 09/20/17   09/20/17    1431    -   274   Closed System Drain 1 Right;Medial Chest Bulb (JP) 19 Fr.   06/15/14    -    Chest   1467   Closed System Drain 2 Right;Lateral Chest Bulb (JP) 19 Fr.   06/15/14    -    Chest   1467   Incision (Closed) 06/15/14 Chest Right   06/15/14    1251     1467   Incision (Closed) 06/15/14 Chest Left   06/15/14    1251     1467          Labs/Imaging Results for orders placed or performed during the hospital encounter of 06/21/18 (from the past 48 hour(s))  Comprehensive metabolic panel     Status: Abnormal   Collection Time: 06/21/18  7:21 PM  Result Value Ref Range   Sodium 143 135 - 145 mmol/L   Potassium 3.5 3.5 - 5.1 mmol/L   Chloride 107 98 - 111 mmol/L   CO2 24 22 - 32 mmol/L   Glucose, Bld 106 (H) 70 - 99 mg/dL   BUN 14 8 - 23 mg/dL   Creatinine, Ser 0.98 0.44 - 1.00 mg/dL   Calcium 9.5 8.9 - 10.3 mg/dL   Total Protein 7.7 6.5 -  8.1 g/dL   Albumin 3.8 3.5 - 5.0 g/dL   AST 37 15 - 41 U/L   ALT 22 0 - 44 U/L   Alkaline Phosphatase 123 38 - 126 U/L   Total Bilirubin 0.6 0.3 - 1.2 mg/dL   GFR calc non Af Amer 57 (L) >60 mL/min   GFR calc Af Amer >60 >60 mL/min    Comment: (NOTE) The eGFR has been calculated using the CKD EPI equation. This calculation has not been validated in all clinical situations. eGFR's persistently <60 mL/min signify possible Chronic Kidney Disease.    Anion gap 12 5 - 15    Comment: Performed at [facility], [place] [address] [place], [place] [address]  Troponin I     Status: Abnormal   Collection Time: 06/21/18  7:21 PM  Result Value Ref Range   Troponin I 0.03 (HH) <0.03 ng/mL    Comment: CRITICAL RESULT CALLED TO, READ BACK BY AND VERIFIED WITH: J.OXENDINE AT 2110 ON 06/21/18 BY N.THOMPSON Performed at [facility], [place] [address] [place], [place] [address]   CBC with Differential     Status: None    Collection Time: 06/21/18  7:21 PM  Result Value Ref Range   WBC 6.6 4.0 - 10.5 K/uL   RBC 4.28 3.87 - 5.11 MIL/uL   Hemoglobin 13.2 12.0 - 15.0 g/dL   HCT 40.0 36.0 - 46.0 %   MCV 93.5 78.0 - 100.0 fL   MCH 30.8 26.0 - 34.0 pg   MCHC 33.0 30.0 - 36.0 g/dL   RDW 13.5 11.5 - 15.5 %   Platelets 219 150 - 400 K/uL   Neutrophils Relative % 78 %   Neutro Abs 5.1 1.7 - 7.7 K/uL   Lymphocytes Relative 12 %   Lymphs Abs 0.8 0.7 - 4.0 K/uL   Monocytes Relative 10 %   Monocytes Absolute 0.7 0.1 - 1.0 K/uL   Eosinophils Relative 0 %   Eosinophils Absolute 0.0 0.0 - 0.7 K/uL   Basophils Relative 0 %   Basophils Absolute 0.0 0.0 - 0.1 K/uL    Comment: Performed at [facility], [place] [address] [place], [place] [address]  CK     Status: Abnormal   Collection Time: 06/21/18  7:21 PM  Result Value Ref Range   Total CK 891 (H) 38 - 234 U/L    Comment: Performed at [facility], [place] [address], [place], [place] [address]  CBG monitoring, ED     Status: None   Collection Time: 06/21/18  7:53 PM  Result Value Ref Range   Glucose-Capillary 95 70 - 99 mg/dL  I-stat Chem 8, ED     Status: Abnormal   Collection Time: 06/21/18  8:10 PM  Result Value Ref Range   Sodium 141 135 - 145 mmol/L   Potassium 3.6 3.5 - 5.1 mmol/L   Chloride 105 98 - 111 mmol/L   BUN 15 8 - 23 mg/dL   Creatinine, Ser 0.90 0.44 - 1.00 mg/dL   Glucose, Bld 107 (H) 70 - 99 mg/dL   Calcium, Ion 1.12 (L) 1.15 - 1.40 mmol/L   TCO2 26 22 - 32 mmol/L   Hemoglobin 13.3 12.0 - 15.0 g/dL   HCT 39.0 36.0 - 46.0 %  Urinalysis, Routine w reflex microscopic     Status: Abnormal   Collection Time: 06/21/18  9:30 PM  Result Value Ref Range   Color, Urine YELLOW YELLOW   APPearance  CLEAR CLEAR   Specific Gravity, Urine 1.009 1.005 - 1.030   pH 6.0 5.0 - 8.0   Glucose, UA NEGATIVE NEGATIVE mg/dL   Hgb urine dipstick SMALL (A) NEGATIVE   Bilirubin Urine NEGATIVE NEGATIVE   Ketones, ur 5 (A)  NEGATIVE mg/dL   Protein, ur NEGATIVE NEGATIVE mg/dL   Nitrite POSITIVE (A) NEGATIVE   Leukocytes, UA TRACE (A) NEGATIVE   RBC / HPF 0-5 0 - 5 RBC/hpf   WBC, UA 6-10 0 - 5 WBC/hpf   Bacteria, UA RARE (A) NONE SEEN   Squamous Epithelial / LPF 0-5 0 - 5    Comment: Performed at [facility], [place] [address], [place], [place] [address]   Dg Chest 2 View  Result Date: 06/21/2018 CLINICAL DATA:  Generalized weakness and emesis.  Cough for 3 weeks. EXAM: CHEST - 2 VIEW COMPARISON:  CT chest 02/08/2018 FINDINGS: The heart size is upper limits of normal. It is exaggerated by low lung volumes. A left subclavian Port-A-Cath is in place. The tip is at the cavoatrial junction. Surgical clips are present at the right axilla. Lungs are clear. No focal nodule, mass, or airspace disease is present. The visualized soft tissues and bony thorax are unremarkable otherwise. IMPRESSION: 1. Borderline cardiomegaly without failure. 2. No acute cardiopulmonary disease. 3. Left subclavian Port-A-Cath terminates at the cavoatrial junction. Electronically Signed   By: San Morelle M.D.   On: 06/21/2018 20:30   Ct Head Wo Contrast  Result Date: 06/21/2018 CLINICAL DATA:  Generalized weakness, vomiting and incontinence. The patient slid to the floor earlier today and was unable to get up. Stage IV breast cancer. EXAM: CT HEAD WITHOUT CONTRAST CT CERVICAL SPINE WITHOUT CONTRAST TECHNIQUE: Multidetector CT imaging of the head and cervical spine was performed following the standard protocol without intravenous contrast. Multiplanar CT image reconstructions of the cervical spine were also generated. COMPARISON:  PET-CT dated 08/31/2017. Brain MR dated 09/13/2015. Head CT dated 09/13/2015. FINDINGS: CT HEAD FINDINGS Brain: Stable mildly enlarged ventricles and cortical sulci. Stable mild patchy white matter low density in both cerebral hemispheres. No intracranial hemorrhage, mass lesion or CT evidence of acute  infarction. Vascular: No hyperdense vessel or unexpected calcification. Skull: Bilateral hyperostosis frontalis. Sinuses/Orbits: Unremarkable. Other: None. CT CERVICAL SPINE FINDINGS Alignment: Reversal the normal lordosis. No subluxations. Skull base and vertebrae: No acute fracture. No primary bone lesion or focal pathologic process. Soft tissues and spinal canal: No prevertebral fluid or swelling. No visible canal hematoma. Disc levels: Multilevel degenerative changes. These are producing varying degrees of spinal stenosis at multiple levels. Upper chest: The previously demonstrated right upper lobe lung nodule is partially included and appears somewhat more irregular. The included portion measures 1.6 cm in maximum diameter, previously 1.3 cm on 02/08/2018. The previously demonstrated conglomeration of right supraclavicular lymph nodes is partially included and may be larger. Two interval sclerotic lesions in the right 1st rib. Other: Bilateral carotid artery calcifications. IMPRESSION: 1. No skull fracture or intracranial hemorrhage. 2. No cervical spine fracture or subluxation. 3. Stable atrophy and chronic small vessel white matter ischemic changes. 4. Multilevel cervical spine degenerative changes with varying degrees of spinal stenosis at multiple levels. 5. Interval mild enlargement of the previously demonstrated right upper lobe lung nodule, suspicious for a metastasis or primary lung cancer. 6. Probable mild increase in size of a conglomeration of enlarged right supraclavicular nodes, compatible with metastatic nodes. 7. Two new sclerotic lesions in the right 1st rib, compatible with metastases. 8. Bilateral  carotid artery atheromatous calcifications. Electronically Signed   By: Claudie Revering M.D.   On: 06/21/2018 21:05   Ct Cervical Spine Wo Contrast  Result Date: 06/21/2018 CLINICAL DATA:  Generalized weakness, vomiting and incontinence. The patient slid to the floor earlier today and was unable to  get up. Stage IV breast cancer. EXAM: CT HEAD WITHOUT CONTRAST CT CERVICAL SPINE WITHOUT CONTRAST TECHNIQUE: Multidetector CT imaging of the head and cervical spine was performed following the standard protocol without intravenous contrast. Multiplanar CT image reconstructions of the cervical spine were also generated. COMPARISON:  PET-CT dated 08/31/2017. Brain MR dated 09/13/2015. Head CT dated 09/13/2015. FINDINGS: CT HEAD FINDINGS Brain: Stable mildly enlarged ventricles and cortical sulci. Stable mild patchy white matter low density in both cerebral hemispheres. No intracranial hemorrhage, mass lesion or CT evidence of acute infarction. Vascular: No hyperdense vessel or unexpected calcification. Skull: Bilateral hyperostosis frontalis. Sinuses/Orbits: Unremarkable. Other: None. CT CERVICAL SPINE FINDINGS Alignment: Reversal the normal lordosis. No subluxations. Skull base and vertebrae: No acute fracture. No primary bone lesion or focal pathologic process. Soft tissues and spinal canal: No prevertebral fluid or swelling. No visible canal hematoma. Disc levels: Multilevel degenerative changes. These are producing varying degrees of spinal stenosis at multiple levels. Upper chest: The previously demonstrated right upper lobe lung nodule is partially included and appears somewhat more irregular. The included portion measures 1.6 cm in maximum diameter, previously 1.3 cm on 02/08/2018. The previously demonstrated conglomeration of right supraclavicular lymph nodes is partially included and may be larger. Two interval sclerotic lesions in the right 1st rib. Other: Bilateral carotid artery calcifications. IMPRESSION: 1. No skull fracture or intracranial hemorrhage. 2. No cervical spine fracture or subluxation. 3. Stable atrophy and chronic small vessel white matter ischemic changes. 4. Multilevel cervical spine degenerative changes with varying degrees of spinal stenosis at multiple levels. 5. Interval mild  enlargement of the previously demonstrated right upper lobe lung nodule, suspicious for a metastasis or primary lung cancer. 6. Probable mild increase in size of a conglomeration of enlarged right supraclavicular nodes, compatible with metastatic nodes. 7. Two new sclerotic lesions in the right 1st rib, compatible with metastases. 8. Bilateral carotid artery atheromatous calcifications. Electronically Signed   By: Claudie Revering M.D.   On: 06/21/2018 21:05   Dg Abd 2 Views  Result Date: 06/21/2018 CLINICAL DATA:  Vomiting and in an adult. EXAM: ABDOMEN - 2 VIEW COMPARISON:  CT abdomen and pelvis FINDINGS: Bowel gas pattern is normal. There is no obstruction or free air. 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 IMPRESSION: 1. Normal bowel gas pattern. 2. Acetabular protrusio bilaterally. Electronically Signed   By: San Morelle M.D.   On: 06/21/2018 20:33    Pending Labs Unresulted Labs (From admission, onward)    Start     Ordered   06/22/18 0500  CK  Tomorrow morning,   R     06/21/18 2301   06/21/18 2302  Troponin I (q 6hr x 3)  Now then every 6 hours,   R     06/21/18 2301   06/21/18 2301  Magnesium  Add-on,   R     06/21/18 2301   06/21/18 2301  T-helper cells (CD4) count (not at [facility])  Add-on,   R     06/21/18 2301   06/21/18 2301  HIV 1 RNA quant-no reflex-bld  Add-on,   R     06/21/18 2301   06/21/18 2301  Phosphorus  Add-on,  R     06/21/18 2301   06/21/18 2224  Urine culture  STAT,   STAT     06/21/18 2223          Vitals/Pain Today's Vitals   06/21/18 1856 06/21/18 1900 06/21/18 1930 06/21/18 2107  BP:  130/82 123/79 140/75  Pulse:  86 84 87  Resp:  _0 Temp: 97.8 F (36.6 C)     TempSrc: Oral     SpO2:  99% 100% 100%  Weight: 78.9 kg     Height: 5' 4.5" (1.638 m)     PainSc:        Isolation Precautions No active isolations  Medications Medications  sodium chloride 0.9 % bolus 1,000 mL (0 mLs  Intravenous Stopped 06/21/18 2220)  aspirin chewable tablet 324 mg (324 mg Oral Given 06/21/18 2224)    Mobility walks with device and person assist

## 2018-06-21 NOTE — ED Notes (Signed)
Pt. CBG 95, RN,Sara made aware.

## 2018-06-21 NOTE — ED Notes (Signed)
Bed: WP79 Expected date:  Expected time:  Means of arrival:  Comments: EMS 71s weakness,emesis

## 2018-06-21 NOTE — H&P (Signed)
Kelly Chavez ZWC:[id number] DOB: 05/12/1946 DOA: 06/21/2018     PCP: [name], MD   Outpatient Specialists:      Oncology  Dr. Alen Blew   ID Dr. Baxter Flattery Patient arrived to ER on 06/21/18 at [facility]  Patient coming from: home Lives alone,        Chief Complaint:  Chief Complaint  Patient presents with  . Weakness  . Emesis    HPI: Kelly Chavez is a 72 y.o. female with medical history significant of breast Cancer stage IV  metastatic disease to the lung and bone , colon cancer, HIV disease, HTN, HLD, CVA    Presented with   Fatigue and nausea with vomiting Slipped off the couch could not get up EMS was called patient was noted to be incontinent because she was too weak to get up to the restroom.  Noted to have vomitus on her but refused Zofran. Patient have had progressive weakness over a while patient denies injury reports mild headache no fevers or chills has been having progressive generalized fatigue. No fevers, she reports no chest pain, reports back discomfort   Regarding pertinent Chronic problems: breast cancer was supposed to be on Xeloda twice a week but the patient deferring History of HIV on Biktarvy   Of hypertension on atenolol controlled  While in ER: CK 800 Trop 0.03 The following Work up has been ordered so far:  Orders Placed This Encounter  Procedures  . Urine culture  . DG Chest 2 View  . CT Head Wo Contrast  . CT Cervical Spine Wo Contrast  . DG Abd 2 Views  . Comprehensive metabolic panel  . Troponin I  . CBC with Differential  . CK  . Urinalysis, Routine w reflex microscopic  . Cardiac monitoring  . In and Out Cath  . Ambulate with assistance  . Patient may eat/drink  . Consult to hospitalist  . I-stat Chem 8, ED  . CBG monitoring, ED  . ED EKG  . EKG 12-Lead  . EKG 12-Lead  . Saline lock IV      Following Medications were ordered in ER: Medications  sodium chloride 0.9 % bolus 1,000 mL (0 mLs Intravenous Stopped 06/21/18  2220)  aspirin chewable tablet 324 mg (324 mg Oral Given 06/21/18 2224)    Significant initial  Findings: Abnormal Labs Reviewed  COMPREHENSIVE METABOLIC PANEL - Abnormal; Notable for the following components:      Result Value   Glucose, Bld 106 (*)    GFR calc non Af Amer 57 (*)    All other components within normal limits  TROPONIN I - Abnormal; Notable for the following components:   Troponin I 0.03 (*)    All other components within normal limits  CK - Abnormal; Notable for the following components:   Total CK 891 (*)    All other components within normal limits  URINALYSIS, ROUTINE W REFLEX MICROSCOPIC - Abnormal; Notable for the following components:   Hgb urine dipstick SMALL (*)    Ketones, ur 5 (*)    Nitrite POSITIVE (*)    Leukocytes, UA TRACE (*)    Bacteria, UA RARE (*)    All other components within normal limits  I-STAT CHEM 8, ED - Abnormal; Notable for the following components:   Glucose, Bld 107 (*)    Calcium, Ion 1.12 (*)    All other components within normal limits     Na 143  K 3.5  Cr  stable,   Lab Results  Component Value Date   CREATININE 0.90 06/21/2018   CREATININE 0.98 06/21/2018   CREATININE 1.08 (H) 05/24/2018      WBC  6.6  HG/HCT  stable,      Component Value Date/Time   HGB 13.3 06/21/2018 2010   HGB 12.6 05/24/2018 1107   HGB 12.2 08/31/2017 1110   HCT 39.0 06/21/2018 2010   HCT 38.3 08/31/2017 1110       UA nitrities positive    CT HEAD  NON acute  CXR - cardiomegaly     ECG:  Personally reviewed by me showing: HR : 86 Rhythm:  NSR,  Ischemic changes: no evidence of ischemic changes QTC 490      ED Triage Vitals  Enc Vitals Group     BP 06/21/18 1847 139/61     Pulse Rate 06/21/18 1847 83     Resp 06/21/18 1847 17     Temp 06/21/18 1856 97.8 F (36.6 C)     Temp Source 06/21/18 1856 Oral     SpO2 06/21/18 1845 97 %     Weight 06/21/18 1856 174 lb (78.9 kg)     Height 06/21/18 1856 5' 4.5" (1.638  m)     Head Circumference --      Peak Flow --      Pain Score 06/21/18 1853 0     Pain Loc --      Pain Edu? --      Excl. in [place]? --   TMAX(24)@       Latest  Blood pressure 140/75, pulse 87, temperature 97.8 F (36.6 C), temperature source Oral, resp. rate 18, height 5' 4.5" (1.638 m), weight 78.9 kg, SpO2 100 %.     Hospitalist was called for admission for dehydration mild rhabdomyolysis    Review of Systems:    Pertinent positives include: fatigue, weight loss   Constitutional:  No weight loss, night sweats, Fevers, chills,   HEENT:  No headaches, Difficulty swallowing,Tooth/dental problems,Sore throat,  No sneezing, itching, ear ache, nasal congestion, post nasal drip,  Cardio-vascular:  No chest pain, Orthopnea, PND, anasarca, dizziness, palpitations.no Bilateral lower extremity swelling  GI:  No heartburn, indigestion, abdominal pain, nausea, vomiting, diarrhea, change in bowel habits, loss of appetite, melena, blood in stool, hematemesis Resp:  no shortness of breath at rest. No dyspnea on exertion, No excess mucus, no productive cough, No non-productive cough, No coughing up of blood.No change in color of mucus.No wheezing. Skin:  no rash or lesions. No jaundice GU:  no dysuria, change in color of urine, no urgency or frequency. No straining to urinate.  No flank pain.  Musculoskeletal:  No joint pain or no joint swelling. No decreased range of motion. No back pain.  Psych:  No change in mood or affect. No depression or anxiety. No memory loss.  Neuro: no localizing neurological complaints, no tingling, no weakness, no double vision, no gait abnormality, no slurred speech, no confusion  All systems reviewed and apart from [place] all are negative  Past Medical History:   Past Medical History:  Diagnosis Date  . Anxiety   . Avascular necrosis of bones of both hips ([place])   . Brain tumor (benign) ([place])   . Cancer of sigmoid colon [facility]) 2011   sigmoid colectomy    . Complication of anesthesia    senitive to all  . HIV (human immunodeficiency virus infection) ([place])   . Hypertension   . Invasive ductal  carcinoma of right breast ([place]) 04/09/14  . 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 Status post chemotherapy 2012  . Stroke [facility])       Past Surgical History:  Procedure Laterality Date  . ABDOMINAL HYSTERECTOMY    . COLON SURGERY     partial colectomy  . MASTECTOMY MODIFIED RADICAL Right 06/15/2014   Procedure: RIGHT MODIFIED RADICAL MASTECTOMY;  Surgeon: Shann Medal, MD;  Location: [facility];  Service: General;  Laterality: Right;  . PORTACATH PLACEMENT N/A 06/15/2014   Procedure: INSERTION PORT-A-CATH LEFT SUBCLAVIAN;  Surgeon: Shann Medal, MD;  Location: [facility];  Service: General;  Laterality: N/A;  . TONSILLECTOMY    . TOTAL ABDOMINAL HYSTERECTOMY W/ BILATERAL SALPINGOOPHORECTOMY  2012    Social History:  Ambulatory  walker       reports that she has never smoked. She has never used smokeless tobacco. She reports that she does not drink alcohol or use drugs.     Family History:   Family History  Problem Relation Age of Onset  . Diabetes Father   . Hyperlipidemia Father   . Stroke Father   . Hypertension Mother   . Hyperlipidemia Mother   . Dementia Mother   . Diabetes Sister   . Diabetes Brother   . Stroke Brother     Allergies: No Known Allergies   Prior to Admission medications   Medication Sig Start Date End Date Taking? Authorizing Provider  acetaminophen (TYLENOL) 325 MG tablet Take 2 tablets (650 mg total) by mouth every 6 (six) hours as needed for mild pain (or Fever >/= 101). 09/22/17  Yes Robbie Lis, MD  atenolol (TENORMIN) 50 MG tablet TAKE 1 TABLET BY MOUTH EVERY DAY 04/24/18  Yes [name], MD  bictegravir-emtricitabine-tenofovir AF (BIKTARVY) 50-200-25 MG TABS tablet Take 1 tablet by mouth daily.    Yes [provider]  Cholecalciferol (VITAMIN D PO) Take 1 tablet by mouth  daily.   Yes [provider]  lidocaine-prilocaine (EMLA) cream Apply a dime size to port 1-2 hours prior to access. Cover with [employer]. 05/24/18  Yes Wyatt Portela, MD  Multiple Vitamins-Minerals (AIRBORNE PO) Take 1 tablet by mouth daily.   Yes [provider]  capecitabine (XELODA) 500 MG tablet Take one tablet (500 mg) by mouth twice daily, within 30 minutes of finishing food. Take for 2 weeks on, one week off, repeat every 3 weeks. Patient not taking: Reported on 06/21/2018 02/13/18   Wyatt Portela, MD  thyroid [facility] THYROID) 60 MG tablet Take 60 mg by mouth daily before breakfast.     [provider]   Physical Exam: Blood pressure 140/75, pulse 87, temperature 97.8 F (36.6 C), temperature source Oral, resp. rate 18, height 5' 4.5" (1.638 m), weight 78.9 kg, SpO2 100 %. 1. General:  in No Acute distress   Chronically ill  -appearing 2. Psychological: Alert and  Oriented 3. Head/ENT:     Dry Mucous Membranes                          Head Non traumatic, neck supple                           Poor Dentition 4. SKIN:  decreased Skin turgor,  Skin clean Dry and intact no rash 5. Heart: Regular rate and rhythm no  Murmur, no Rub or gallop 6. Lungs:  no wheezes or crackles   7. Abdomen: Soft,  non-tender, Non distended  bowel sounds present 8. Lower extremities: no clubbing, cyanosis, or  edema 9. Neurologically diminished strength through out  10. MSK: Normal range of motion   LABS:     Recent Labs  Lab 06/21/18 1921 06/21/18 2010  WBC 6.6  --   NEUTROABS 5.1  --   HGB 13.2 13.3  HCT 40.0 39.0  MCV 93.5  --   PLT 219  --    Basic Metabolic Panel: Recent Labs  Lab 06/21/18 1921 06/21/18 2010  NA 143 141  K 3.5 3.6  CL 107 105  CO2 24  --   GLUCOSE 106* 107*  BUN 14 15  CREATININE 0.98 0.90  CALCIUM 9.5  --       Recent Labs  Lab 06/21/18 1921  AST 37  ALT 22  ALKPHOS 123  BILITOT 0.6  PROT 7.7  ALBUMIN 3.8   No results  for input(s): LIPASE, AMYLASE in the last 168 hours. No results for input(s): AMMONIA in the last 168 hours.    HbA1C: No results for input(s): HGBA1C in the last 72 hours. CBG: Recent Labs  Lab 06/21/18 1953  GLUCAP 95      Urine analysis:    Component Value Date/Time   COLORURINE YELLOW 06/21/2018 2130   APPEARANCEUR CLEAR 06/21/2018 2130   LABSPEC 1.009 06/21/2018 2130   PHURINE 6.0 06/21/2018 2130   GLUCOSEU NEGATIVE 06/21/2018 2130   HGBUR SMALL (A) 06/21/2018 2130   BILIRUBINUR NEGATIVE 06/21/2018 2130   KETONESUR 5 (A) 06/21/2018 2130   PROTEINUR NEGATIVE 06/21/2018 2130   UROBILINOGEN 0.2 01/22/2014 1459   NITRITE POSITIVE (A) 06/21/2018 2130   LEUKOCYTESUR TRACE (A) 06/21/2018 2130       Cultures:    Component Value Date/Time   SDES BLOOD LEFT ARM 09/20/2017 2026   SDES BLOOD LEFT ANTECUBITAL 09/20/2017 2026   SPECREQUEST  09/20/2017 2026    BOTTLES DRAWN AEROBIC ONLY Blood Culture adequate volume   SPECREQUEST  09/20/2017 2026    BOTTLES DRAWN AEROBIC ONLY Blood Culture adequate volume   CULT  09/20/2017 2026    NO GROWTH 5 DAYS Performed at [facility] [address], [place], [place] [address]    CULT  09/20/2017 2026    NO GROWTH 5 DAYS Performed at [facility] [address] [place], [place] [address]    REPTSTATUS 09/26/2017 FINAL 09/20/2017 2026   REPTSTATUS 09/26/2017 FINAL 09/20/2017 2026     Radiological Exams on Admission: Dg Chest 2 View  Result Date: 06/21/2018 CLINICAL DATA:  Generalized weakness and emesis.  Cough for 3 weeks. EXAM: CHEST - 2 VIEW COMPARISON:  CT chest 02/08/2018 FINDINGS: The heart size is upper limits of normal. It is exaggerated by low lung volumes. A left subclavian Port-A-Cath is in place. The tip is at the cavoatrial junction. Surgical clips are present at the right axilla. Lungs are clear. No focal nodule, mass, or airspace disease is present. The visualized soft tissues and bony thorax are  unremarkable otherwise. IMPRESSION: 1. Borderline cardiomegaly without failure. 2. No acute cardiopulmonary disease. 3. Left subclavian Port-A-Cath terminates at the cavoatrial junction. Electronically Signed   By: San Morelle M.D.   On: 06/21/2018 20:30   Ct Head Wo Contrast  Result Date: 06/21/2018 CLINICAL DATA:  Generalized weakness, vomiting and incontinence. The patient slid to the floor earlier today and was unable to get up. Stage IV breast  cancer. EXAM: CT HEAD WITHOUT CONTRAST CT CERVICAL SPINE WITHOUT CONTRAST TECHNIQUE: Multidetector CT imaging of the head and cervical spine was performed following the standard protocol without intravenous contrast. Multiplanar CT image reconstructions of the cervical spine were also generated. COMPARISON:  PET-CT dated 08/31/2017. Brain MR dated 09/13/2015. Head CT dated 09/13/2015. FINDINGS: CT HEAD FINDINGS Brain: Stable mildly enlarged ventricles and cortical sulci. Stable mild patchy white matter low density in both cerebral hemispheres. No intracranial hemorrhage, mass lesion or CT evidence of acute infarction. Vascular: No hyperdense vessel or unexpected calcification. Skull: Bilateral hyperostosis frontalis. Sinuses/Orbits: Unremarkable. Other: None. CT CERVICAL SPINE FINDINGS Alignment: Reversal the normal lordosis. No subluxations. Skull base and vertebrae: No acute fracture. No primary bone lesion or focal pathologic process. Soft tissues and spinal canal: No prevertebral fluid or swelling. No visible canal hematoma. Disc levels: Multilevel degenerative changes. These are producing varying degrees of spinal stenosis at multiple levels. Upper chest: The previously demonstrated right upper lobe lung nodule is partially included and appears somewhat more irregular. The included portion measures 1.6 cm in maximum diameter, previously 1.3 cm on 02/08/2018. The previously demonstrated conglomeration of right supraclavicular lymph nodes is partially  included and may be larger. Two interval sclerotic lesions in the right 1st rib. Other: Bilateral carotid artery calcifications. IMPRESSION: 1. No skull fracture or intracranial hemorrhage. 2. No cervical spine fracture or subluxation. 3. Stable atrophy and chronic small vessel white matter ischemic changes. 4. Multilevel cervical spine degenerative changes with varying degrees of spinal stenosis at multiple levels. 5. Interval mild enlargement of the previously demonstrated right upper lobe lung nodule, suspicious for a metastasis or primary lung cancer. 6. Probable mild increase in size of a conglomeration of enlarged right supraclavicular nodes, compatible with metastatic nodes. 7. Two new sclerotic lesions in the right 1st rib, compatible with metastases. 8. Bilateral carotid artery atheromatous calcifications. Electronically Signed   By: Claudie Revering M.D.   On: 06/21/2018 21:05   Ct Cervical Spine Wo Contrast  Result Date: 06/21/2018 CLINICAL DATA:  Generalized weakness, vomiting and incontinence. The patient slid to the floor earlier today and was unable to get up. Stage IV breast cancer. EXAM: CT HEAD WITHOUT CONTRAST CT CERVICAL SPINE WITHOUT CONTRAST TECHNIQUE: Multidetector CT imaging of the head and cervical spine was performed following the standard protocol without intravenous contrast. Multiplanar CT image reconstructions of the cervical spine were also generated. COMPARISON:  PET-CT dated 08/31/2017. Brain MR dated 09/13/2015. Head CT dated 09/13/2015. FINDINGS: CT HEAD FINDINGS Brain: Stable mildly enlarged ventricles and cortical sulci. Stable mild patchy white matter low density in both cerebral hemispheres. No intracranial hemorrhage, mass lesion or CT evidence of acute infarction. Vascular: No hyperdense vessel or unexpected calcification. Skull: Bilateral hyperostosis frontalis. Sinuses/Orbits: Unremarkable. Other: None. CT CERVICAL SPINE FINDINGS Alignment: Reversal the normal lordosis. No  subluxations. Skull base and vertebrae: No acute fracture. No primary bone lesion or focal pathologic process. Soft tissues and spinal canal: No prevertebral fluid or swelling. No visible canal hematoma. Disc levels: Multilevel degenerative changes. These are producing varying degrees of spinal stenosis at multiple levels. Upper chest: The previously demonstrated right upper lobe lung nodule is partially included and appears somewhat more irregular. The included portion measures 1.6 cm in maximum diameter, previously 1.3 cm on 02/08/2018. The previously demonstrated conglomeration of right supraclavicular lymph nodes is partially included and may be larger. Two interval sclerotic lesions in the right 1st rib. Other: Bilateral carotid artery calcifications. IMPRESSION: 1. No skull fracture or intracranial  hemorrhage. 2. No cervical spine fracture or subluxation. 3. Stable atrophy and chronic small vessel white matter ischemic changes. 4. Multilevel cervical spine degenerative changes with varying degrees of spinal stenosis at multiple levels. 5. Interval mild enlargement of the previously demonstrated right upper lobe lung nodule, suspicious for a metastasis or primary lung cancer. 6. Probable mild increase in size of a conglomeration of enlarged right supraclavicular nodes, compatible with metastatic nodes. 7. Two new sclerotic lesions in the right 1st rib, compatible with metastases. 8. Bilateral carotid artery atheromatous calcifications. Electronically Signed   By: Claudie Revering M.D.   On: 06/21/2018 21:05   Dg Abd 2 Views  Result Date: 06/21/2018 CLINICAL DATA:  Vomiting and in an adult. EXAM: ABDOMEN - 2 VIEW COMPARISON:  CT abdomen and pelvis FINDINGS: Bowel gas pattern is normal. There is no obstruction or free air. 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 IMPRESSION: 1. Normal bowel gas pattern. 2. Acetabular protrusio bilaterally. Electronically Signed    By: San Morelle M.D.   On: 06/21/2018 20:33    Chart has been reviewed    Assessment/Plan  72 y.o. female with medical history significant of breast Cancer stage IV  metastatic disease to the lung and bone , colon cancer, HIV disease, HTN, HLD, CVA     Admitted for dehydration mild rhabdomyolysis    Present on Admission: . Breast cancer of upper-inner quadrant of right female breast ([place]) -overall poor prognosis given diffuse metastatic spread patient at this point still unsure if she wants to pursue any treatment.  She wants to hold off and see and discussed with her son in a.m.  She is agreeable to palliative care consult at this point just her have a discussion regarding goals of care.  Will email oncology to keep them in the loop . Essential hypertension, -benign stable continue to monitor . HIV disease ([place]) -continue home medications patient reports compliance will check HIV viral load and CD4 count . Hyperlipidemia --stable continue home medications . Dehydration- we will rehydrate and follow kidney function . Rhabdomyolysis- rehydrate and follow CK . Elevated troponin -continue to cycle cardiac enzymes currently denies any chest pain or shortness of breath.  No syncope tachycardia to suggest or hypoxia PE patient at this point unsure how to proceed and if she wants to have any additional imaging done  Lower extremity weakness patient denies paresthesias incontinence or change in sensation she feels generally weak discussed with her given no metastatic spread to lumbar spine they will be prudent to obtain MRI to rule out cord compression given significant lower extremity weakness patient at this point unsure if she wants to proceed asking to wait until tomorrow morning to discuss with her family patient would like [place] to defer further imaging for tonight  Other plan as per orders.  DVT prophylaxis:  SCD   Code Status:  FULL CODE  as per patient   I had personally discussed  CODE STATUS with patient   Family Communication:   Family not  at  Bedside    Disposition Plan:  likely will need placement for rehabilitation                                            Would benefit from PT/OT eval prior to DC  Ordered  Nutrition    consulted                                     Palliative care    consulted              Consults called: email oncology   Admission status:  Obs    Level of care    tele              Toy Baker 06/22/2018, 1:13 AM    [facility]  Pager [phone]   after 2 AM please page floor coverage PA If 7AM-7PM, please contact the day team taking care of the patient  [email or web address]  Password [email or web address]

## 2018-06-22 ENCOUNTER — Other Ambulatory Visit: Payer: Self-pay

## 2018-06-22 DIAGNOSIS — I10 Essential (primary) hypertension: Secondary | ICD-10-CM

## 2018-06-22 DIAGNOSIS — E860 Dehydration: Secondary | ICD-10-CM | POA: Diagnosis not present

## 2018-06-22 DIAGNOSIS — E7849 Other hyperlipidemia: Secondary | ICD-10-CM

## 2018-06-22 DIAGNOSIS — R748 Abnormal levels of other serum enzymes: Secondary | ICD-10-CM

## 2018-06-22 DIAGNOSIS — R531 Weakness: Secondary | ICD-10-CM | POA: Diagnosis not present

## 2018-06-22 DIAGNOSIS — C50211 Malignant neoplasm of upper-inner quadrant of right female breast: Secondary | ICD-10-CM

## 2018-06-22 DIAGNOSIS — Z515 Encounter for palliative care: Secondary | ICD-10-CM | POA: Diagnosis not present

## 2018-06-22 DIAGNOSIS — M6282 Rhabdomyolysis: Secondary | ICD-10-CM

## 2018-06-22 DIAGNOSIS — R5383 Other fatigue: Secondary | ICD-10-CM | POA: Diagnosis not present

## 2018-06-22 DIAGNOSIS — B20 Human immunodeficiency virus [HIV] disease: Secondary | ICD-10-CM

## 2018-06-22 LAB — CBC
HCT: 35.2 % — ABNORMAL LOW (ref 36.0–46.0)
HEMOGLOBIN: 11.4 g/dL — AB (ref 12.0–15.0)
MCH: 30.4 pg (ref 26.0–34.0)
MCHC: 32.4 g/dL (ref 30.0–36.0)
MCV: 93.9 fL (ref 78.0–100.0)
Platelets: 192 10*3/uL (ref 150–400)
RBC: 3.75 MIL/uL — AB (ref 3.87–5.11)
RDW: 13.7 % (ref 11.5–15.5)
WBC: 5.5 10*3/uL (ref 4.0–10.5)

## 2018-06-22 LAB — MAGNESIUM
Magnesium: 2 mg/dL (ref 1.7–2.4)
Magnesium: 2.1 mg/dL (ref 1.7–2.4)

## 2018-06-22 LAB — TROPONIN I
TROPONIN I: 0.03 ng/mL — AB (ref <0.03)
Troponin I: 0.03 ng/mL (ref <0.03)
Troponin I: 0.03 ng/mL (ref <0.03)

## 2018-06-22 LAB — COMPREHENSIVE METABOLIC PANEL
ALBUMIN: 2.9 g/dL — AB (ref 3.5–5.0)
ALT: 17 U/L (ref 0–44)
ANION GAP: 9 (ref 5–15)
AST: 29 U/L (ref 15–41)
Alkaline Phosphatase: 96 U/L (ref 38–126)
BUN: 12 mg/dL (ref 8–23)
CHLORIDE: 109 mmol/L (ref 98–111)
CO2: 25 mmol/L (ref 22–32)
Calcium: 8.5 mg/dL — ABNORMAL LOW (ref 8.9–10.3)
Creatinine, Ser: 0.83 mg/dL (ref 0.44–1.00)
GFR calc Af Amer: >60 mL/min (ref >60)
GFR calc non Af Amer: >60 mL/min (ref >60)
GLUCOSE: 69 mg/dL — AB (ref 70–99)
Potassium: 3.1 mmol/L — ABNORMAL LOW (ref 3.5–5.1)
SODIUM: 143 mmol/L (ref 135–145)
Total Bilirubin: 0.5 mg/dL (ref 0.3–1.2)
Total Protein: 6.4 g/dL — ABNORMAL LOW (ref 6.5–8.1)

## 2018-06-22 LAB — CK: CK TOTAL: 703 U/L — AB (ref 38–234)

## 2018-06-22 LAB — PHOSPHORUS
Phosphorus: 2.9 mg/dL (ref 2.5–4.6)
Phosphorus: 2.9 mg/dL (ref 2.5–4.6)

## 2018-06-22 LAB — PREALBUMIN: Prealbumin: 13.5 mg/dL — ABNORMAL LOW (ref 18–38)

## 2018-06-22 LAB — TSH: TSH: 11.093 u[IU]/mL — AB (ref 0.350–4.500)

## 2018-06-22 LAB — T4, FREE: FREE T4: 0.47 ng/dL — AB (ref 0.82–1.77)

## 2018-06-22 LAB — HEMOGLOBIN A1C
Hgb A1c MFr Bld: 6 % — ABNORMAL HIGH (ref 4.8–5.6)
Mean Plasma Glucose: 125.5 mg/dL

## 2018-06-22 MED ORDER — MORPHINE SULFATE (PF) 2 MG/ML IV SOLN: 1.0000 mg | INTRAVENOUS | Status: DC | PRN

## 2018-06-22 MED ORDER — ATENOLOL 25 MG PO TABS
50.0000 mg | ORAL_TABLET | Freq: Every day | ORAL | Status: DC
  Administered 2018-06-22 – 2018-06-24 (×3): 50 mg via ORAL
  Filled 2018-06-22 (×3): qty 2

## 2018-06-22 MED ORDER — BICTEGRAVIR-EMTRICITAB-TENOFOV 50-200-25 MG PO TABS
1.0000 | ORAL_TABLET | Freq: Every day | ORAL | Status: DC
  Administered 2018-06-22 – 2018-06-24 (×3): 1 via ORAL
  Filled 2018-06-22 (×3): qty 1

## 2018-06-22 MED ORDER — THYROID 60 MG PO TABS
60.0000 mg | ORAL_TABLET | Freq: Every day | ORAL | Status: DC
  Administered 2018-06-22 – 2018-06-24 (×3): 60 mg via ORAL
  Filled 2018-06-22 (×3): qty 1

## 2018-06-22 MED ORDER — ACETAMINOPHEN 650 MG RE SUPP: 650.0000 mg | Freq: Four times a day (QID) | RECTAL | Status: DC | PRN

## 2018-06-22 MED ORDER — ONDANSETRON HCL 4 MG PO TABS: 4.0000 mg | ORAL_TABLET | Freq: Four times a day (QID) | ORAL | Status: DC | PRN

## 2018-06-22 MED ORDER — ACETAMINOPHEN 325 MG PO TABS: 650.0000 mg | ORAL_TABLET | Freq: Four times a day (QID) | ORAL | Status: DC | PRN

## 2018-06-22 MED ORDER — HYDROCODONE-ACETAMINOPHEN 5-325 MG PO TABS: 1.0000 | ORAL_TABLET | ORAL | Status: DC | PRN

## 2018-06-22 MED ORDER — SODIUM CHLORIDE 0.9 % IV SOLN
INTRAVENOUS | Status: AC
  Administered 2018-06-22: 01:00:00 via INTRAVENOUS

## 2018-06-22 MED ORDER — POTASSIUM CHLORIDE CRYS ER 20 MEQ PO TBCR
40.0000 meq | EXTENDED_RELEASE_TABLET | Freq: Once | ORAL | Status: AC
  Administered 2018-06-22: 40 meq via ORAL
  Filled 2018-06-22: qty 2

## 2018-06-22 MED ORDER — POLYETHYLENE GLYCOL 3350 17 G PO PACK: 17.0000 g | PACK | Freq: Every day | ORAL | Status: DC | PRN

## 2018-06-22 MED ORDER — ONDANSETRON HCL 4 MG/2ML IJ SOLN: 4.0000 mg | Freq: Four times a day (QID) | INTRAMUSCULAR | Status: DC | PRN

## 2018-06-22 MED ORDER — SODIUM CHLORIDE 0.9% FLUSH: 10.0000 mL | INTRAVENOUS | Status: DC | PRN

## 2018-06-22 NOTE — Consult Note (Signed)
Consultation Note Date: 06/22/2018   Patient Name: Kelly Chavez  DOB: 12-31-1945  MRN: [id number]  Age / Sex: 72 y.o., female  PCP: Kelly Blade, MD Referring Physician: Cristal Ford, DO  Reason for Consultation: Establishing goals of care  HPI/Patient Profile: 72 y.o. female admitted on 06/21/2018   Clinical Assessment and Goals of Care: 72 year old lady who lives at home by herself in [place], [place]. She was diagnosed with breast cancer. She states that it is stage IV. She has known history of metastatic burden to lungs and bone. She has a son Kelly Chavez who is away for work but is driving in later tonight. Patient has seen her medical oncologist previously several times. All those notes have been reviewed. Patient has been offered multiple times to be on Xeloda but she has refused.  Patient has been admitted with generalized weakness. Repeat imaging shows slightly increased metastatic burden, enlarged lung nodules, new bone lesions, enlarged supraclavicular lymph nodes. A palliative medicine consultation has been requested for goals of care discussions.  Patient is awake alert resting in bed. I introduced myself and palliative care as follows: Palliative medicine is specialized medical care for people living with serious illness. It focuses on providing relief from the symptoms and stress of a serious illness. The goal is to improve quality of life for both the patient and the family.  Patient's son Kelly Chavez called the patient's cell phone, was placed on speaker phone and CODE STATUS and goals of care discussions were initiated. We discussed extensively about CODE STATUS. We also discussed about hospice philosophy of care. Goals wishes and values attempted to be elicited. Discussed with patient with son listening over the phone about comfort measures and different levels of hospice  support available.  All of the patient and son's questions answered to the best of my ability. Please see additional discussions/recommendations as listed below. Thank you for the consult.   HCPOA  son Kelly Chavez [phone]  SUMMARY OF RECOMMENDATIONS    code status discussions undertaken in detail, with the patient's son Kelly Chavez on the phone, now code status established as DNR DNI.  Add low dose Morphine IV PRN.  Son is driving into town later tonight. Patient lives at home alone currently. Once son is in town, will need to discuss home with hospice on discharge. If not able to return home, then would recommend considering PT consult, SNF rehab with palliative on discharge.  Thank you for the consult.   Code Status/Advance Care Planning:  DNR    Symptom Management:    Add low dose IV Morphine PRN.   Palliative Prophylaxis:   Delirium Protocol   Psycho-social/Spiritual:   Desire for further Chaplaincy support:yes  Additional Recommendations: Education on Hospice  Prognosis:   < 6 months  Discharge Planning: Home with Hospice      Primary Diagnoses: Present on Admission: . Breast cancer of upper-inner quadrant of right female breast ([place]) . Essential hypertension, benign . HIV disease ([place]) . Hyperlipidemia . Dehydration .  Rhabdomyolysis . Elevated troponin   I have reviewed the medical record, interviewed the patient and family, and examined the patient. The following aspects are pertinent.  Past Medical History:  Diagnosis Date  . Anxiety   . Avascular necrosis of bones of both hips ([place])   . Brain tumor (benign) ([place])   . Cancer of sigmoid colon [facility]) 2011   sigmoid colectomy   . Complication of anesthesia    senitive to all  . HIV (human immunodeficiency virus infection) ([place])   . Hypertension   . 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 Status post chemotherapy 2012    . Stroke [facility])    Social History   Socioeconomic History  . Marital status: Widowed    Spouse name: Not on file  . Number of children: Not on file  . Years of education: Not on file  . Highest education level: Not on file  Occupational History  . Not on file  Social Needs  . Financial resource strain: Not on file  . Food insecurity:    Worry: Not on file    Inability: Not on file  . Transportation needs:    Medical: Not on file    Non-medical: Not on file  Tobacco Use  . Smoking status: Never Smoker  . Smokeless tobacco: Never Used  Substance and Sexual Activity  . Alcohol use: No    Alcohol/week: 0.0 standard drinks  . Drug use: No  . Sexual activity: Not Currently    Partners: Male    Birth control/protection: Abstinence  Lifestyle  . Physical activity:    Days per week: Not on file    Minutes per session: Not on file  . Stress: Not on file  Relationships  . Social connections:    Talks on phone: Not on file    Gets together: Not on file    Attends religious service: Not on file    Active member of club or organization: Not on file    Attends meetings of clubs or organizations: Not on file    Relationship status: Not on file  Other Topics Concern  . Not on file  Social History Narrative  . Not on file   Family History  Problem Relation Age of Onset  . Diabetes Father   . Hyperlipidemia Father   . Stroke Father   . Hypertension Mother   . Hyperlipidemia Mother   . Dementia Mother   . Diabetes Sister   . Diabetes Brother   . Stroke Brother    Scheduled Meds: . atenolol  50 mg Oral Daily  . bictegravir-emtricitabine-tenofovir AF  1 tablet Oral Daily  . thyroid  60 mg Oral QAC breakfast   Continuous Infusions: PRN Meds:.acetaminophen **OR** acetaminophen, HYDROcodone-acetaminophen, morphine injection, ondansetron **OR** ondansetron (ZOFRAN) IV, polyethylene glycol, sodium chloride flush Medications Prior to Admission:  Prior to Admission medications    Medication Sig Start Date End Date Taking? Authorizing Provider  acetaminophen (TYLENOL) 325 MG tablet Take 2 tablets (650 mg total) by mouth every 6 (six) hours as needed for mild pain (or Fever >/= 101). 09/22/17  Yes Robbie Lis, MD  atenolol (TENORMIN) 50 MG tablet TAKE 1 TABLET BY MOUTH EVERY DAY 04/24/18  Yes [name], MD  bictegravir-emtricitabine-tenofovir AF (BIKTARVY) 50-200-25 MG TABS tablet Take 1 tablet by mouth daily.    Yes [provider]  Cholecalciferol (VITAMIN D PO) Take 1 tablet by mouth daily.  Yes [provider]  lidocaine-prilocaine (EMLA) cream Apply a dime size to port 1-2 hours prior to access. Cover with [employer]. 05/24/18  Yes Wyatt Portela, MD  Multiple Vitamins-Minerals (AIRBORNE PO) Take 1 tablet by mouth daily.   Yes [provider]  capecitabine (XELODA) 500 MG tablet Take one tablet (500 mg) by mouth twice daily, within 30 minutes of finishing food. Take for 2 weeks on, one week off, repeat every 3 weeks. Patient not taking: Reported on 06/21/2018 02/13/18   Wyatt Portela, MD  thyroid [facility] THYROID) 60 MG tablet Take 60 mg by mouth daily before breakfast.     [provider]   No Known Allergies Review of Systems +mild generalized pain  Physical Exam Weak, chronically ill appearing lady resting in bed Dry oral mucosa S1-S2 Clear breath sounds Abdomen soft non distended Trace edema Awake alert oriented A little bit flat in affect  Vital Signs: BP 136/70   Pulse 85   Temp 98.6 F (37 C)   Resp 17   Ht 5\' 3"  (1.6 m)   Wt 76.2 kg   SpO2 100%   BMI 29.76 kg/m  Pain Scale: 0-10   Pain Score: 0-No pain   SpO2: SpO2: 100 % O2 Device:SpO2: 100 % O2 Flow Rate: .   IO: Intake/output summary:   Intake/Output Summary (Last 24 hours) at 06/22/2018 1147 Last data filed at 06/21/2018 2220 Gross per 24 hour  Intake 1000 ml  Output -  Net 1000 ml    LBM:   Baseline Weight: Weight: 78.9 kg Most  recent weight: Weight: 76.2 kg     Palliative Assessment/Data:   PPS 40%  Time In:   10 Time Out:  11 Time Total:  60 min  Greater than 50%  of this time was spent counseling and coordinating care related to the above assessment and plan.  Signed by: Loistine Chance, MD [phone]   Please contact Palliative Medicine Team phone at [phone] for questions and concerns.  For individual provider: See Shea Evans

## 2018-06-22 NOTE — Progress Notes (Signed)
PROGRESS NOTE    Kelly Chavez  FTD:[id number] DOB: 08/11/1946 DOA: 06/21/2018 PCP: [name], MD   Brief Narrative:  HPI On 06/21/2018 by Dr. Toy Baker Kelly Chavez is a 72 y.o. female with medical history significant of breast Cancer stage IV metastatic disease to the lung and bone , colon cancer, HIV disease, HTN, HLD, CVA  Presented with   Fatigue and nausea with vomiting Slipped off the couch could not get up EMS was called patient was noted to be incontinent because she was too weak to get up to the restroom.  Noted to have vomitus on her but refused Zofran. Patient have had progressive weakness over a while patient denies injury reports mild headache no fevers or chills has been having progressive generalized fatigue. No fevers, she reports no chest pain, reports back discomfort  Interim history Admitted for weakness and fatigue.  Has lower extremity weakness, question whether this could be due to metastasis.  MRI discussed with patient, she would like to think about this. Assessment & Plan   Lower extremity weakness and fatigue  -Suspect secondary to cancer versus not taking medications -CXR unremarkable for infection -CT head shows no skull fracture or intracranial hemorrhage. -Cervical spine CT showed no fracture or subluxation.  Noted to have mild enlargement of previously demonstrated right upper lobe lung nodule, suspicious for metastasis or primary lung cancer.  Mild increase in size of conglomeration of enlarged right supraclavicular nodes, compatible with metastatic nodes.  2 new sclerotic lesions on the right first rib. -PT consulted recommending SNF or 24/7 physical assist at home if patient/son chooses hospice or comfort care. -Suggested MRI however patient would like to think about this. -Continue pain control and supportive care  Asymptomatic bacteruria  -UA showed trace leukocytes, positive nitrites, rare bacteria, 6-10 WBC -Urine culture  pending  Rhabdomyolysis  -CK level on admission 891, currently trending downward, 703 -Continue IV hydration  Essential Hypertension -Continue atenolol  Abnormal thyroid studies -TSH 11.093, FT4 0.47 -admits to not taking medications in a while -Continue Armour  HIV disease -Continue Biktarvy   Breast cancer with metastasis  -See CT scan as listed above  Hypokalemia -Will replace and continue to monitor BMP -Magnesium checked, currently 2.1  Goals of care -Palliative care consulted and appreciated.  Discussed with patient's son via phone as well as patient, CODE STATUS now DNR.  DVT Prophylaxis  SCDs  Code Status: DNR  Family Communication: None at bedside  Disposition Plan: Observation, pending improvement and possibly SNF placement  Consultants Palliative care   Procedures  None  Antibiotics   Anti-infectives (From admission, onward)   Start     Dose/Rate Route Frequency Ordered Stop   06/22/18 1000  bictegravir-emtricitabine-tenofovir AF (BIKTARVY) 50-200-25 MG per tablet 1 tablet     1 tablet Oral Daily 06/22/18 0032        Subjective:   Kelly Chavez seen and examined today.  Continues to have weakness in her legs and just overall fatigue.  States she does not like taking pills.  Denies current chest pain, shortness of breath, abdominal pain, nausea or vomiting, diarrhea or constipation.  Objective:   Vitals:   06/21/18 2107 06/21/18 2347 06/22/18 0039 06/22/18 0045  BP: 140/75 (!) 155/69 136/70   Pulse: 87 94 85   Resp: 18 17    Temp:   98.6 F (37 C)   TempSrc:      SpO2: 100% 97% 100%   Weight:    76.2 kg  Height:  5\' 3"  (1.6 m)    Intake/Output Summary (Last 24 hours) at 06/22/2018 1325 Last data filed at 06/21/2018 2220 Gross per 24 hour  Intake 1000 ml  Output -  Net 1000 ml   Filed Weights   06/21/18 1856 06/22/18 0045  Weight: 78.9 kg 76.2 kg    Exam  General: Well developed, well nourished, NAD, appears stated age  41:  NCAT, mucous membranes moist.   Neck: Supple  Cardiovascular: S1 S2 auscultated, RRR, no murmur  Respiratory: Clear to auscultation bilaterally with equal chest rise  Abdomen: Soft, nontender, nondistended, + bowel sounds  Extremities: warm dry without cyanosis clubbing or edema  Neuro: AAOx3, cannot fully assess strength  Psych: Flat affect  Data Reviewed: I have personally reviewed following labs and imaging studies  CBC: Recent Labs  Lab 06/21/18 1921 06/21/18 2010 06/22/18 0808  WBC 6.6  --  5.5  NEUTROABS 5.1  --   --   HGB 13.2 13.3 11.4*  HCT 40.0 39.0 35.2*  MCV 93.5  --  93.9  PLT 219  --  742   Basic Metabolic Panel: Recent Labs  Lab 06/21/18 1921 06/21/18 2010 06/22/18 0152 06/22/18 0808  NA 143 141  --  143  K 3.5 3.6  --  3.1*  CL 107 105  --  109  CO2 24  --   --  25  GLUCOSE 106* 107*  --  69*  BUN 14 15  --  12  CREATININE 0.98 0.90  --  0.83  CALCIUM 9.5  --   --  8.5*  MG  --   --  2.0 2.1  PHOS  --   --  2.9 2.9   GFR: Estimated Creatinine Clearance: 60.8 mL/min (by C-G formula based on SCr of 0.83 mg/dL). Liver Function Tests: Recent Labs  Lab 06/21/18 1921 06/22/18 0808  AST 37 29  ALT 22 17  ALKPHOS 123 96  BILITOT 0.6 0.5  PROT 7.7 6.4*  ALBUMIN 3.8 2.9*   No results for input(s): LIPASE, AMYLASE in the last 168 hours. No results for input(s): AMMONIA in the last 168 hours. Coagulation Profile: No results for input(s): INR, PROTIME in the last 168 hours. Cardiac Enzymes: Recent Labs  Lab 06/21/18 1921 06/22/18 0152 06/22/18 0808  CKTOTAL 891*  --  703*  TROPONINI 0.03* 0.03* 0.03*   BNP (last 3 results) No results for input(s): PROBNP in the last 8760 hours. HbA1C: Recent Labs    06/22/18 0152  HGBA1C 6.0*   CBG: Recent Labs  Lab 06/21/18 1953  GLUCAP 95   Lipid Profile: No results for input(s): CHOL, HDL, LDLCALC, TRIG, CHOLHDL, LDLDIRECT in the last 72 hours. Thyroid Function Tests: Recent Labs     06/22/18 0152 06/22/18 0808  TSH 11.093*  --   FREET4  --  0.47*   Anemia Panel: No results for input(s): VITAMINB12, FOLATE, FERRITIN, TIBC, IRON, RETICCTPCT in the last 72 hours. Urine analysis:    Component Value Date/Time   COLORURINE YELLOW 06/21/2018 2130   APPEARANCEUR CLEAR 06/21/2018 2130   LABSPEC 1.009 06/21/2018 2130   PHURINE 6.0 06/21/2018 2130   GLUCOSEU NEGATIVE 06/21/2018 2130   HGBUR SMALL (A) 06/21/2018 2130   BILIRUBINUR NEGATIVE 06/21/2018 2130   KETONESUR 5 (A) 06/21/2018 2130   PROTEINUR NEGATIVE 06/21/2018 2130   UROBILINOGEN 0.2 01/22/2014 1459   NITRITE POSITIVE (A) 06/21/2018 2130   LEUKOCYTESUR TRACE (A) 06/21/2018 2130   Sepsis Labs: @LABRCNTIP (procalcitonin:4,lacticidven:4)  )No results found for  this or any previous visit (from the past 240 hour(s)).    Radiology Studies: Dg Chest 2 View  Result Date: 06/21/2018 CLINICAL DATA:  Generalized weakness and emesis.  Cough for 3 weeks. EXAM: CHEST - 2 VIEW COMPARISON:  CT chest 02/08/2018 FINDINGS: The heart size is upper limits of normal. It is exaggerated by low lung volumes. A left subclavian Port-A-Cath is in place. The tip is at the cavoatrial junction. Surgical clips are present at the right axilla. Lungs are clear. No focal nodule, mass, or airspace disease is present. The visualized soft tissues and bony thorax are unremarkable otherwise. IMPRESSION: 1. Borderline cardiomegaly without failure. 2. No acute cardiopulmonary disease. 3. Left subclavian Port-A-Cath terminates at the cavoatrial junction. Electronically Signed   By: San Morelle M.D.   On: 06/21/2018 20:30   Ct Head Wo Contrast  Result Date: 06/21/2018 CLINICAL DATA:  Generalized weakness, vomiting and incontinence. The patient slid to the floor earlier today and was unable to get up. Stage IV breast cancer. EXAM: CT HEAD WITHOUT CONTRAST CT CERVICAL SPINE WITHOUT CONTRAST TECHNIQUE: Multidetector CT imaging of the head and  cervical spine was performed following the standard protocol without intravenous contrast. Multiplanar CT image reconstructions of the cervical spine were also generated. COMPARISON:  PET-CT dated 08/31/2017. Brain MR dated 09/13/2015. Head CT dated 09/13/2015. FINDINGS: CT HEAD FINDINGS Brain: Stable mildly enlarged ventricles and cortical sulci. Stable mild patchy white matter low density in both cerebral hemispheres. No intracranial hemorrhage, mass lesion or CT evidence of acute infarction. Vascular: No hyperdense vessel or unexpected calcification. Skull: Bilateral hyperostosis frontalis. Sinuses/Orbits: Unremarkable. Other: None. CT CERVICAL SPINE FINDINGS Alignment: Reversal the normal lordosis. No subluxations. Skull base and vertebrae: No acute fracture. No primary bone lesion or focal pathologic process. Soft tissues and spinal canal: No prevertebral fluid or swelling. No visible canal hematoma. Disc levels: Multilevel degenerative changes. These are producing varying degrees of spinal stenosis at multiple levels. Upper chest: The previously demonstrated right upper lobe lung nodule is partially included and appears somewhat more irregular. The included portion measures 1.6 cm in maximum diameter, previously 1.3 cm on 02/08/2018. The previously demonstrated conglomeration of right supraclavicular lymph nodes is partially included and may be larger. Two interval sclerotic lesions in the right 1st rib. Other: Bilateral carotid artery calcifications. IMPRESSION: 1. No skull fracture or intracranial hemorrhage. 2. No cervical spine fracture or subluxation. 3. Stable atrophy and chronic small vessel white matter ischemic changes. 4. Multilevel cervical spine degenerative changes with varying degrees of spinal stenosis at multiple levels. 5. Interval mild enlargement of the previously demonstrated right upper lobe lung nodule, suspicious for a metastasis or primary lung cancer. 6. Probable mild increase in size  of a conglomeration of enlarged right supraclavicular nodes, compatible with metastatic nodes. 7. Two new sclerotic lesions in the right 1st rib, compatible with metastases. 8. Bilateral carotid artery atheromatous calcifications. Electronically Signed   By: Claudie Revering M.D.   On: 06/21/2018 21:05   Ct Cervical Spine Wo Contrast  Result Date: 06/21/2018 CLINICAL DATA:  Generalized weakness, vomiting and incontinence. The patient slid to the floor earlier today and was unable to get up. Stage IV breast cancer. EXAM: CT HEAD WITHOUT CONTRAST CT CERVICAL SPINE WITHOUT CONTRAST TECHNIQUE: Multidetector CT imaging of the head and cervical spine was performed following the standard protocol without intravenous contrast. Multiplanar CT image reconstructions of the cervical spine were also generated. COMPARISON:  PET-CT dated 08/31/2017. Brain MR dated 09/13/2015. Head CT dated 09/13/2015.  FINDINGS: CT HEAD FINDINGS Brain: Stable mildly enlarged ventricles and cortical sulci. Stable mild patchy white matter low density in both cerebral hemispheres. No intracranial hemorrhage, mass lesion or CT evidence of acute infarction. Vascular: No hyperdense vessel or unexpected calcification. Skull: Bilateral hyperostosis frontalis. Sinuses/Orbits: Unremarkable. Other: None. CT CERVICAL SPINE FINDINGS Alignment: Reversal the normal lordosis. No subluxations. Skull base and vertebrae: No acute fracture. No primary bone lesion or focal pathologic process. Soft tissues and spinal canal: No prevertebral fluid or swelling. No visible canal hematoma. Disc levels: Multilevel degenerative changes. These are producing varying degrees of spinal stenosis at multiple levels. Upper chest: The previously demonstrated right upper lobe lung nodule is partially included and appears somewhat more irregular. The included portion measures 1.6 cm in maximum diameter, previously 1.3 cm on 02/08/2018. The previously demonstrated conglomeration of right  supraclavicular lymph nodes is partially included and may be larger. Two interval sclerotic lesions in the right 1st rib. Other: Bilateral carotid artery calcifications. IMPRESSION: 1. No skull fracture or intracranial hemorrhage. 2. No cervical spine fracture or subluxation. 3. Stable atrophy and chronic small vessel white matter ischemic changes. 4. Multilevel cervical spine degenerative changes with varying degrees of spinal stenosis at multiple levels. 5. Interval mild enlargement of the previously demonstrated right upper lobe lung nodule, suspicious for a metastasis or primary lung cancer. 6. Probable mild increase in size of a conglomeration of enlarged right supraclavicular nodes, compatible with metastatic nodes. 7. Two new sclerotic lesions in the right 1st rib, compatible with metastases. 8. Bilateral carotid artery atheromatous calcifications. Electronically Signed   By: Claudie Revering M.D.   On: 06/21/2018 21:05   Dg Abd 2 Views  Result Date: 06/21/2018 CLINICAL DATA:  Vomiting and in an adult. EXAM: ABDOMEN - 2 VIEW COMPARISON:  CT abdomen and pelvis FINDINGS: Bowel gas pattern is normal. There is no obstruction or free air. 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 IMPRESSION: 1. Normal bowel gas pattern. 2. Acetabular protrusio bilaterally. Electronically Signed   By: San Morelle M.D.   On: 06/21/2018 20:33     Scheduled Meds: . atenolol  50 mg Oral Daily  . bictegravir-emtricitabine-tenofovir AF  1 tablet Oral Daily  . thyroid  60 mg Oral QAC breakfast   Continuous Infusions:   LOS: 0 days   Time Spent in minutes   30 minutes  Hades Mathew D.O. on 06/22/2018 at 1:25 PM  Between 7am to 7pm - Please see pager noted on [email or web address]  After 7pm go to [email or web address]  And look for the night coverage person covering for me after hours  [facility] Office  [phone]

## 2018-06-22 NOTE — Evaluation (Signed)
Physical Therapy Evaluation Patient Details Name: Kelly Chavez MRN: [id number] DOB: July 04, 1946 Today's Date: 06/22/2018   History of Present Illness  72yo female with history of recent fall off of her couch, could not get up and called EMS. PMH breast CA stage IV metastatic to lung and bone, colon CA, HIV, CVA, avascular necrosis B hips, hx brain tumor   Clinical Impression   Patient received in bed, pleasant and willing to work with skilled PT services today. She requires MaxA for functional bed mobility and has strong posterior lean at EOB, required Max intervention from PT to bring trunk up and prevent slide off of EOB due to strong trunk extension. Unable to correct posture to upright position needed for sit to stand transfer due to trunk weakness, poor balance, and posterior lean sitting, will need +2 assistance to perform this transfer safely/progress mobility safely moving forward. She was left in bed with all needs met, bed alarm active, nurse tech present and attending, RN aware of patient status. She will continue to benefit from skilled PT services in the acute setting as well as ST-SNF moving forward to further address functional deficits; note if she and her family choose hospice/comfort care, she will also require 24/7 physical assistance/S at home from PT perspective.     Follow Up Recommendations SNF;Other (comment)(or 24/7 physical assist at home if patient/son choose hospice/comfort care )    Equipment Recommendations  Other (comment)(defer to next venue )    Recommendations for Other Services       Precautions / Restrictions Precautions Precautions: Fall Restrictions Weight Bearing Restrictions: No      Mobility  Bed Mobility Overal bed mobility: Needs Assistance Bed Mobility: Supine to Sit;Sit to Supine     Supine to sit: Max assist;HOB elevated Sit to supine: Max assist   General bed mobility comments: MaxA to bring LEs over EOB and to elevate trunk; patient  with strong posterior lean unable to correct even with Max cues/facilitation from PT  Transfers                 General transfer comment: DNT due to need for +2 for safe progression of mobiltiy   Ambulation/Gait             General Gait Details: DNT due to need for +2 for safe progrsesion of mobility   Stairs            Wheelchair Mobility    Modified Rankin (Stroke Patients Only)       Balance Overall balance assessment: Needs assistance;History of Falls   Sitting balance-Leahy Scale: Poor   Postural control: Posterior lean   Standing balance-Leahy Scale: Zero                               Pertinent Vitals/Pain Pain Assessment: 0-10 Pain Score: 4  Pain Location: all over, general pain  Pain Descriptors / Indicators: Aching;Sore Pain Intervention(s): Limited activity within patient's tolerance;Monitored during session    [place] expects to be discharged to:: Private residence Living Arrangements: Alone Available Help at Discharge: Neighbor;Available PRN/intermittently Type of Home: Apartment Home Access: Level entry     Home Layout: One level Home Equipment: Walker - 4 wheels;Cane - quad      Prior Function Level of Independence: Independent with assistive device(s)         Comments: Patient reports she drives to get groceries and cooks her own meals but  it's getting more difficult to care for herself     Hand Dominance        Extremity/Trunk Assessment   Upper Extremity Assessment Upper Extremity Assessment: Defer to OT evaluation    Lower Extremity Assessment Lower Extremity Assessment: Generalized weakness    Cervical / Trunk Assessment Cervical / Trunk Assessment: Kyphotic  Communication   Communication: No difficulties  Cognition Arousal/Alertness: Awake/alert Behavior During Therapy: Flat affect Overall Cognitive Status: Within Functional Limits for tasks assessed                                         General Comments      Exercises     Assessment/Plan    PT Assessment Patient needs continued PT services  PT Problem List Decreased strength;Obesity;Decreased activity tolerance;Decreased safety awareness;Decreased balance;Pain;Decreased mobility;Decreased coordination       PT Treatment Interventions DME instruction;Balance training;Gait training;Neuromuscular re-education;Stair training;Functional mobility training;Patient/family education;Therapeutic activities;Therapeutic exercise;Manual techniques    PT Goals (Current goals can be found in the Care Plan section)  Acute Rehab PT Goals Patient Stated Goal: to get stronger, not fall  PT Goal Formulation: With patient Time For Goal Achievement: 07/06/18 Potential to Achieve Goals: Fair    Frequency Min 2X/week   Barriers to discharge        Co-evaluation               AM-PAC PT "6 Clicks" Daily Activity  Outcome Measure Difficulty turning over in bed (including adjusting bedclothes, sheets and blankets)?: Unable Difficulty moving from lying on back to sitting on the side of the bed? : Unable Difficulty sitting down on and standing up from a chair with arms (e.g., wheelchair, bedside commode, etc,.)?: Unable Help needed moving to and from a bed to chair (including a wheelchair)?: Total Help needed walking in hospital room?: Total Help needed climbing 3-5 steps with a railing? : Total 6 Click Score: 6    End of Session Equipment Utilized During Treatment: Gait belt Activity Tolerance: Patient limited by fatigue Patient left: in bed;with call bell/phone within reach;with bed alarm set;with nursing/sitter in room   PT Visit Diagnosis: Unsteadiness on feet (R26.81);Difficulty in walking, not elsewhere classified (R26.2);Muscle weakness (generalized) (M62.81);History of falling (Z91.81)    Time: 0930-1000 PT Time Calculation (min) (ACUTE ONLY): 30 min   Charges:   PT  Evaluation $PT Eval Moderate Complexity: 1 Mod PT Treatments $Therapeutic Activity: 8-22 mins        Deniece Ree PT, DPT, CBIS  Supplemental Physical Therapist [place]    Pager [phone] Acute Rehab Office [phone]

## 2018-06-23 DIAGNOSIS — R748 Abnormal levels of other serum enzymes: Secondary | ICD-10-CM | POA: Diagnosis not present

## 2018-06-23 DIAGNOSIS — R5383 Other fatigue: Secondary | ICD-10-CM | POA: Diagnosis not present

## 2018-06-23 DIAGNOSIS — C50211 Malignant neoplasm of upper-inner quadrant of right female breast: Secondary | ICD-10-CM | POA: Diagnosis not present

## 2018-06-23 DIAGNOSIS — R531 Weakness: Secondary | ICD-10-CM | POA: Diagnosis not present

## 2018-06-23 DIAGNOSIS — E860 Dehydration: Secondary | ICD-10-CM | POA: Diagnosis not present

## 2018-06-23 DIAGNOSIS — Z515 Encounter for palliative care: Secondary | ICD-10-CM | POA: Diagnosis not present

## 2018-06-23 LAB — COMPREHENSIVE METABOLIC PANEL WITH GFR
ALT: 19 U/L (ref 0–44)
AST: 28 U/L (ref 15–41)
Albumin: 2.9 g/dL — ABNORMAL LOW (ref 3.5–5.0)
Alkaline Phosphatase: 98 U/L (ref 38–126)
Anion gap: 7 (ref 5–15)
BUN: 13 mg/dL (ref 8–23)
CO2: 25 mmol/L (ref 22–32)
Calcium: 8.8 mg/dL — ABNORMAL LOW (ref 8.9–10.3)
Chloride: 113 mmol/L — ABNORMAL HIGH (ref 98–111)
Creatinine, Ser: 0.84 mg/dL (ref 0.44–1.00)
GFR calc Af Amer: >60 mL/min
GFR calc non Af Amer: >60 mL/min
Glucose, Bld: 78 mg/dL (ref 70–99)
Potassium: 3.8 mmol/L (ref 3.5–5.1)
Sodium: 145 mmol/L (ref 135–145)
Total Bilirubin: 0.4 mg/dL (ref 0.3–1.2)
Total Protein: 6.5 g/dL (ref 6.5–8.1)

## 2018-06-23 LAB — CBC
HCT: 37.2 % (ref 36.0–46.0)
Hemoglobin: 11.9 g/dL — ABNORMAL LOW (ref 12.0–15.0)
MCH: 30.4 pg (ref 26.0–34.0)
MCHC: 32 g/dL (ref 30.0–36.0)
MCV: 95.1 fL (ref 78.0–100.0)
Platelets: 203 10*3/uL (ref 150–400)
RBC: 3.91 MIL/uL (ref 3.87–5.11)
RDW: 14.1 % (ref 11.5–15.5)
WBC: 4.7 10*3/uL (ref 4.0–10.5)

## 2018-06-23 LAB — CK: Total CK: 409 U/L — ABNORMAL HIGH (ref 38–234)

## 2018-06-23 LAB — HIV-1 RNA QUANT-NO REFLEX-BLD
HIV 1 RNA Quant: <20 copies/mL
LOG10 HIV-1 RNA: UNDETERMINED {Log_copies}/mL

## 2018-06-23 MED ORDER — PNEUMOCOCCAL VAC POLYVALENT 25 MCG/0.5ML IJ INJ
0.5000 mL | INJECTION | INTRAMUSCULAR | Status: DC
  Filled 2018-06-23: qty 0.5

## 2018-06-23 NOTE — Clinical Social Work Note (Signed)
Clinical Social Work Assessment  Patient Details  Name: Kelly Chavez MRN: [id number] Date of Birth: Apr 24, 1946  Date of referral:  06/23/18               Reason for consult:  Facility Placement                Permission sought to share information with:  Facility Sport and exercise psychologist Permission granted to share information::  Yes, Verbal Permission Granted  Name::     Jermiya Reichl  Agency::  SNF  Relationship::  Son  Contact Information:  [phone]  Housing/Transportation Living arrangements for the past 2 months:  [place] of Information:  Patient Patient Interpreter Needed:  None Criminal Activity/Legal Involvement Pertinent to Current Situation/Hospitalization:  No - Comment as needed Significant Relationships:  Adult Children Lives with:  Self Do you feel safe going back to the place where you live?  Yes Need for family participation in patient care:  No (Coment)  Care giving concerns: Patient lives alone and reports no family nearby who offers support. She stated she has friends who help out if needed. Patient is currently Max A for functional bed mobility and requires +2 assist for transfer per PT evaluation.    Social Worker assessment / plan:  CSW met with patient at bedside to discuss discharge plan and SNF referral process. Patient lives alone and reports that she was adequately caring for herself although it is difficult. She uses a walker at baseline and reports her lift chair is broken. Patient said she does not have family support nearby and has friends she can ask for help if need.   Patient is very reluctant to go to SNF for rehab. She immediately denied the going anywhere but home at discharge. She requests home health because she recognizes she will need therapy and help. CSW encouraged and motivated patient to consider short term rehab due to her living alone and current physical impairments. Patient stated she "is not giving up on life!" and  wants to regain strength. Patient has had previous poor experiences with rehab facilities and does not trust them. Patient became agreeable to considering SNF as an option after lengthy discussion and agreed to allow CSW to make referrals.  CSW will complete FL2 and send out referrals.   Employment status:  Retired Forensic scientist:  Medicare PT Recommendations:  [place] / Referral to community resources:  [place]  Patient/Family's Response to care:  Patient reports she is ready to go home and tired of being in the hospital. She is Pharmacologist and acknowledges current needs.   Patient/Family's Understanding of and Emotional Response to Diagnosis, Current Treatment, and Prognosis:  Patient understands and acknowledges that she will need therapy to regain strength to live at home. However, she is very apprehensive to go to SNF. CSW encouraged patient to consider short term therapy due to her living alone and lack of support nearby.  Emotional Assessment Appearance:  Appears stated age Attitude/Demeanor/Rapport:  Apprehensive, Suspicious Affect (typically observed):  Calm, Apprehensive Orientation:  Oriented to Self, Oriented to Place, Oriented to  Time, Oriented to Situation Alcohol / Substance use:  Not Applicable Psych involvement (Current and /or in the community):  No (Comment)  Discharge Needs  Concerns to be addressed:  Care Coordination Readmission within the last 30 days:  No Current discharge risk:  Lives alone, Physical Impairment Barriers to Discharge:  Continued Medical Work up   [employer], [place] 06/23/2018,  3:09 PM

## 2018-06-23 NOTE — Progress Notes (Signed)
PROGRESS NOTE    Kelly Chavez  ZJQ:[id number] DOB: 1945-12-11 DOA: 06/21/2018 PCP: [name], MD   Brief Narrative:  HPI On 06/21/2018 by Dr. Toy Baker Kelly Chavez is a 72 y.o. female with medical history significant of breast Cancer stage IV metastatic disease to the lung and bone , colon cancer, HIV disease, HTN, HLD, CVA  Presented with   Fatigue and nausea with vomiting Slipped off the couch could not get up EMS was called patient was noted to be incontinent because she was too weak to get up to the restroom.  Noted to have vomitus on her but refused Zofran. Patient have had progressive weakness over a while patient denies injury reports mild headache no fevers or chills has been having progressive generalized fatigue. No fevers, she reports no chest pain, reports back discomfort  Interim history Admitted for weakness and fatigue.  Has lower extremity weakness, question whether this could be due to metastasis.  Pending discharge situations.  Assessment & Plan   Lower extremity weakness and fatigue  -Suspect secondary to cancer versus not taking medications -CXR unremarkable for infection -CT head shows no skull fracture or intracranial hemorrhage. -Cervical spine CT showed no fracture or subluxation.  Noted to have mild enlargement of previously demonstrated right upper lobe lung nodule, suspicious for metastasis or primary lung cancer.  Mild increase in size of conglomeration of enlarged right supraclavicular nodes, compatible with metastatic nodes.  2 new sclerotic lesions on the right first rib. -PT consulted recommending SNF or 24/7 physical assist at home if patient/son chooses hospice or comfort care. -Suggested MRI however patient would like to think about this. Discussed more with patient today and she stated she knows her cancer has spread, what is the point of doing the MRI.  -Continue pain control and supportive care -not sure patient can care for herself-  she wishes to return home. We discussed hospice and comfort care; she states she wanted to think about when she gets home.  Asymptomatic bacteruria  -UA showed trace leukocytes, positive nitrites, rare bacteria, 6-10 WBC -Urine culture >100k GNR  Rhabdomyolysis  -CK level on admission 891, currently trending downward, 409 -Continue IV hydration  Essential Hypertension -Continue atenolol  Abnormal thyroid studies -TSH 11.093, FT4 0.47 -admits to not taking medications in a while- only wanted to take natural things  -Continue Armour  HIV disease -Continue Biktarvy   Breast cancer with metastasis  -See CT scan as listed above  Hypokalemia -Replaced, continue to monitor BMP -Magnesium checked, currently 2.1  Goals of care -Palliative care consulted and appreciated.  Discussed with patient's son via phone as well as patient, CODE STATUS now DNR.   DVT Prophylaxis  SCDs  Code Status: DNR  Family Communication: None at bedside. Discussed with son.  Disposition Plan: Observation, pending improvement and possibly SNF placement vs home with hospice  Consultants Palliative care   Procedures  None  Antibiotics   Anti-infectives (From admission, onward)   Start     Dose/Rate Route Frequency Ordered Stop   06/22/18 1000  bictegravir-emtricitabine-tenofovir AF (BIKTARVY) 50-200-25 MG per tablet 1 tablet     1 tablet Oral Daily 06/22/18 0032        Subjective:   Kelly Chavez seen and examined today.  Continues to have weakness. Denies current chest pain, shortness of breath, abdominal pain, nausea, vomiting, diarrhea, constipation.   Objective:   Vitals:   06/22/18 0045 06/22/18 1408 06/22/18 2106 06/23/18 0526  BP:  (!) 142/58 131/67 134/75  Pulse:  63 61 62  Resp:  12 17 15   Temp:  98.8 F (37.1 C) 98.3 F (36.8 C) 98.1 F (36.7 C)  TempSrc:  Oral Oral Oral  SpO2:  98% 97% 97%  Weight: 76.2 kg     Height: 5\' 3"  (1.6 m)       Intake/Output Summary (Last 24  hours) at 06/23/2018 1351 Last data filed at 06/23/2018 1027 Gross per 24 hour  Intake 120 ml  Output 1300 ml  Net -1180 ml   Filed Weights   06/21/18 1856 06/22/18 0045  Weight: 78.9 kg 76.2 kg   Exam  General: Well developed, well nourished, NAD, appears stated age  60: NCAT, mucous membranes moist.   Neck: Supple  Cardiovascular: S1 S2 auscultated, no murmur, RRR  Respiratory: Clear to auscultation bilaterally with equal chest rise  Abdomen: Soft, nontender, nondistended, + bowel sounds  Extremities: warm dry without cyanosis clubbing or edema  Neuro: AAOx3, generally weak  Psych: Flat affect  Data Reviewed: I have personally reviewed following labs and imaging studies  CBC: Recent Labs  Lab 06/21/18 1921 06/21/18 2010 06/22/18 0808 06/23/18 0813  WBC 6.6  --  5.5 4.7  NEUTROABS 5.1  --   --   --   HGB 13.2 13.3 11.4* 11.9*  HCT 40.0 39.0 35.2* 37.2  MCV 93.5  --  93.9 95.1  PLT 219  --  192 449   Basic Metabolic Panel: Recent Labs  Lab 06/21/18 1921 06/21/18 2010 06/22/18 0152 06/22/18 0808 06/23/18 0813  NA 143 141  --  143 145  K 3.5 3.6  --  3.1* 3.8  CL 107 105  --  109 113*  CO2 24  --   --  25 25  GLUCOSE 106* 107*  --  69* 78  BUN 14 15  --  12 13  CREATININE 0.98 0.90  --  0.83 0.84  CALCIUM 9.5  --   --  8.5* 8.8*  MG  --   --  2.0 2.1  --   PHOS  --   --  2.9 2.9  --    GFR: Estimated Creatinine Clearance: 60 mL/min (by C-G formula based on SCr of 0.84 mg/dL). Liver Function Tests: Recent Labs  Lab 06/21/18 1921 06/22/18 0808 06/23/18 0813  AST 37 29 28  ALT 22 17 19   ALKPHOS 123 96 98  BILITOT 0.6 0.5 0.4  PROT 7.7 6.4* 6.5  ALBUMIN 3.8 2.9* 2.9*   No results for input(s): LIPASE, AMYLASE in the last 168 hours. No results for input(s): AMMONIA in the last 168 hours. Coagulation Profile: No results for input(s): INR, PROTIME in the last 168 hours. Cardiac Enzymes: Recent Labs  Lab 06/21/18 1921 06/22/18 0152  06/22/18 0808 06/22/18 1355 06/23/18 0813  CKTOTAL 891*  --  703*  --  409*  TROPONINI 0.03* 0.03* 0.03* 0.03*  --    BNP (last 3 results) No results for input(s): PROBNP in the last 8760 hours. HbA1C: Recent Labs    06/22/18 0152  HGBA1C 6.0*   CBG: Recent Labs  Lab 06/21/18 1953  GLUCAP 95   Lipid Profile: No results for input(s): CHOL, HDL, LDLCALC, TRIG, CHOLHDL, LDLDIRECT in the last 72 hours. Thyroid Function Tests: Recent Labs    06/22/18 0152 06/22/18 0808  TSH 11.093*  --   FREET4  --  0.47*   Anemia Panel: No results for input(s): VITAMINB12, FOLATE, FERRITIN, TIBC, IRON, RETICCTPCT in the last 72 hours.  Urine analysis:    Component Value Date/Time   COLORURINE YELLOW 06/21/2018 2130   APPEARANCEUR CLEAR 06/21/2018 2130   LABSPEC 1.009 06/21/2018 2130   PHURINE 6.0 06/21/2018 2130   GLUCOSEU NEGATIVE 06/21/2018 2130   HGBUR SMALL (A) 06/21/2018 2130   BILIRUBINUR NEGATIVE 06/21/2018 2130   KETONESUR 5 (A) 06/21/2018 2130   PROTEINUR NEGATIVE 06/21/2018 2130   UROBILINOGEN 0.2 01/22/2014 1459   NITRITE POSITIVE (A) 06/21/2018 2130   LEUKOCYTESUR TRACE (A) 06/21/2018 2130   Sepsis Labs: @LABRCNTIP (procalcitonin:4,lacticidven:4)  ) Recent Results (from the past 240 hour(s))  Urine culture     Status: Abnormal (Preliminary result)   Collection Time: 06/21/18 10:24 PM  Result Value Ref Range Status   Specimen Description   Final    URINE, RANDOM Performed at [facility], [place] [address] [place], [place] [address]    Special Requests   Final    NONE Performed at [facility], [place] [address] [place], [place] [address]    Culture >=100,000 COLONIES/mL GRAM NEGATIVE RODS (A)  Final   Report Status PENDING  Incomplete      Radiology Studies: Dg Chest 2 View  Result Date: 06/21/2018 CLINICAL DATA:  Generalized weakness and emesis.  Cough for 3 weeks. EXAM: CHEST - 2 VIEW COMPARISON:  CT chest 02/08/2018  FINDINGS: The heart size is upper limits of normal. It is exaggerated by low lung volumes. A left subclavian Port-A-Cath is in place. The tip is at the cavoatrial junction. Surgical clips are present at the right axilla. Lungs are clear. No focal nodule, mass, or airspace disease is present. The visualized soft tissues and bony thorax are unremarkable otherwise. IMPRESSION: 1. Borderline cardiomegaly without failure. 2. No acute cardiopulmonary disease. 3. Left subclavian Port-A-Cath terminates at the cavoatrial junction. Electronically Signed   By: San Morelle M.D.   On: 06/21/2018 20:30   Ct Head Wo Contrast  Result Date: 06/21/2018 CLINICAL DATA:  Generalized weakness, vomiting and incontinence. The patient slid to the floor earlier today and was unable to get up. Stage IV breast cancer. EXAM: CT HEAD WITHOUT CONTRAST CT CERVICAL SPINE WITHOUT CONTRAST TECHNIQUE: Multidetector CT imaging of the head and cervical spine was performed following the standard protocol without intravenous contrast. Multiplanar CT image reconstructions of the cervical spine were also generated. COMPARISON:  PET-CT dated 08/31/2017. Brain MR dated 09/13/2015. Head CT dated 09/13/2015. FINDINGS: CT HEAD FINDINGS Brain: Stable mildly enlarged ventricles and cortical sulci. Stable mild patchy white matter low density in both cerebral hemispheres. No intracranial hemorrhage, mass lesion or CT evidence of acute infarction. Vascular: No hyperdense vessel or unexpected calcification. Skull: Bilateral hyperostosis frontalis. Sinuses/Orbits: Unremarkable. Other: None. CT CERVICAL SPINE FINDINGS Alignment: Reversal the normal lordosis. No subluxations. Skull base and vertebrae: No acute fracture. No primary bone lesion or focal pathologic process. Soft tissues and spinal canal: No prevertebral fluid or swelling. No visible canal hematoma. Disc levels: Multilevel degenerative changes. These are producing varying degrees of spinal  stenosis at multiple levels. Upper chest: The previously demonstrated right upper lobe lung nodule is partially included and appears somewhat more irregular. The included portion measures 1.6 cm in maximum diameter, previously 1.3 cm on 02/08/2018. The previously demonstrated conglomeration of right supraclavicular lymph nodes is partially included and may be larger. Two interval sclerotic lesions in the right 1st rib. Other: Bilateral carotid artery calcifications. IMPRESSION: 1. No skull fracture or intracranial hemorrhage. 2. No cervical spine fracture or subluxation. 3. Stable atrophy and chronic  small vessel white matter ischemic changes. 4. Multilevel cervical spine degenerative changes with varying degrees of spinal stenosis at multiple levels. 5. Interval mild enlargement of the previously demonstrated right upper lobe lung nodule, suspicious for a metastasis or primary lung cancer. 6. Probable mild increase in size of a conglomeration of enlarged right supraclavicular nodes, compatible with metastatic nodes. 7. Two new sclerotic lesions in the right 1st rib, compatible with metastases. 8. Bilateral carotid artery atheromatous calcifications. Electronically Signed   By: Claudie Revering M.D.   On: 06/21/2018 21:05   Ct Cervical Spine Wo Contrast  Result Date: 06/21/2018 CLINICAL DATA:  Generalized weakness, vomiting and incontinence. The patient slid to the floor earlier today and was unable to get up. Stage IV breast cancer. EXAM: CT HEAD WITHOUT CONTRAST CT CERVICAL SPINE WITHOUT CONTRAST TECHNIQUE: Multidetector CT imaging of the head and cervical spine was performed following the standard protocol without intravenous contrast. Multiplanar CT image reconstructions of the cervical spine were also generated. COMPARISON:  PET-CT dated 08/31/2017. Brain MR dated 09/13/2015. Head CT dated 09/13/2015. FINDINGS: CT HEAD FINDINGS Brain: Stable mildly enlarged ventricles and cortical sulci. Stable mild patchy white  matter low density in both cerebral hemispheres. No intracranial hemorrhage, mass lesion or CT evidence of acute infarction. Vascular: No hyperdense vessel or unexpected calcification. Skull: Bilateral hyperostosis frontalis. Sinuses/Orbits: Unremarkable. Other: None. CT CERVICAL SPINE FINDINGS Alignment: Reversal the normal lordosis. No subluxations. Skull base and vertebrae: No acute fracture. No primary bone lesion or focal pathologic process. Soft tissues and spinal canal: No prevertebral fluid or swelling. No visible canal hematoma. Disc levels: Multilevel degenerative changes. These are producing varying degrees of spinal stenosis at multiple levels. Upper chest: The previously demonstrated right upper lobe lung nodule is partially included and appears somewhat more irregular. The included portion measures 1.6 cm in maximum diameter, previously 1.3 cm on 02/08/2018. The previously demonstrated conglomeration of right supraclavicular lymph nodes is partially included and may be larger. Two interval sclerotic lesions in the right 1st rib. Other: Bilateral carotid artery calcifications. IMPRESSION: 1. No skull fracture or intracranial hemorrhage. 2. No cervical spine fracture or subluxation. 3. Stable atrophy and chronic small vessel white matter ischemic changes. 4. Multilevel cervical spine degenerative changes with varying degrees of spinal stenosis at multiple levels. 5. Interval mild enlargement of the previously demonstrated right upper lobe lung nodule, suspicious for a metastasis or primary lung cancer. 6. Probable mild increase in size of a conglomeration of enlarged right supraclavicular nodes, compatible with metastatic nodes. 7. Two new sclerotic lesions in the right 1st rib, compatible with metastases. 8. Bilateral carotid artery atheromatous calcifications. Electronically Signed   By: Claudie Revering M.D.   On: 06/21/2018 21:05   Dg Abd 2 Views  Result Date: 06/21/2018 CLINICAL DATA:  Vomiting and  in an adult. EXAM: ABDOMEN - 2 VIEW COMPARISON:  CT abdomen and pelvis FINDINGS: Bowel gas pattern is normal. There is no obstruction or free air. 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 IMPRESSION: 1. Normal bowel gas pattern. 2. Acetabular protrusio bilaterally. Electronically Signed   By: San Morelle M.D.   On: 06/21/2018 20:33     Scheduled Meds: . atenolol  50 mg Oral Daily  . bictegravir-emtricitabine-tenofovir AF  1 tablet Oral Daily  . thyroid  60 mg Oral QAC breakfast   Continuous Infusions:   LOS: 0 days   Time Spent in minutes   30 minutes  Duha Abair D.O. on 06/23/2018 at  1:51 PM  Between 7am to 7pm - Please see pager noted on [email or web address]  After 7pm go to [email or web address]  And look for the night coverage person covering for me after hours  [facility] Office  [phone]

## 2018-06-23 NOTE — Progress Notes (Signed)
Daily Progress Note   Patient Name: Kelly Chavez       Date: 06/23/2018 DOB: April 24, 1946  Age: 72 y.o. MRN#: [id number] Attending Physician: Cristal Ford, DO Primary Care Physician: [name], MD Admit Date: 06/21/2018  Reason for Consultation/Follow-up: Establishing goals of care  Subjective:  Patient is awake alert resting in bed. She states she didn't rest much last night.  There is no family at bedside See below:  Length of Stay: 0  Current Medications: Scheduled Meds:  . atenolol  50 mg Oral Daily  . bictegravir-emtricitabine-tenofovir AF  1 tablet Oral Daily  . thyroid  60 mg Oral QAC breakfast    Continuous Infusions:   PRN Meds: acetaminophen **OR** acetaminophen, HYDROcodone-acetaminophen, morphine injection, ondansetron **OR** ondansetron (ZOFRAN) IV, polyethylene glycol, sodium chloride flush  Physical Exam         Awake alert In no distress Appears chronically ill Clear breath sounds S1 S2 Abdomen is not distended Has no edema Awake alert oriented Appears to have generalized weakness  Vital Signs: BP 134/75 (BP Location: Right Arm)   Pulse 62   Temp 98.1 F (36.7 C) (Oral)   Resp 15   Ht 5\' 3"  (1.6 m)   Wt 76.2 kg   SpO2 97%   BMI 29.76 kg/m  SpO2: SpO2: 97 % O2 Device: O2 Device: Room Air O2 Flow Rate:    Intake/output summary:   Intake/Output Summary (Last 24 hours) at 06/23/2018 1341 Last data filed at 06/23/2018 1027 Gross per 24 hour  Intake 120 ml  Output 1300 ml  Net -1180 ml   LBM:   Baseline Weight: Weight: 78.9 kg Most recent weight: Weight: 76.2 kg       Palliative Assessment/Data:    PPS 40%  Patient Active Problem List   Diagnosis Date Noted  . Dehydration 06/21/2018  . Rhabdomyolysis 06/21/2018  . Elevated  troponin 06/21/2018  . Bilateral lower leg cellulitis 09/20/2017  . Weakness 09/20/2017  . Port catheter in place 06/28/2016  . Spastic hemiplegia affecting nondominant side ([place]) 11/29/2015  . 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 Acute CVA (cerebrovascular accident) ([place]) 09/14/2015  . CVA (cerebral infarction) 09/13/2015  . Hyperlipidemia 05/31/2014  . Pedal edema 05/11/2014  . Paranoia ([place]) 05/11/2014  . Breast cancer of upper-inner quadrant of  right female breast ([place]) 04/15/2014  . History of pituitary tumor 01/28/2014  . Avascular necrosis of bones of both hips ([place]) 01/28/2014  . Genital herpes 01/28/2014  . Ovarian cyst, bilateral 01/26/2014  . Essential hypertension, benign 01/26/2014  . Unspecified vitamin D deficiency 01/26/2014  . DJD (degenerative joint disease) 01/26/2014  . 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 Goals, wishes and values attempted to be elicited. Reviewed her pertinent medical history again with her.  Patient's son drove from [place] yesterday, patient spoke with him over the phone last night. He is now in [place], [place].  Patient plans on discussing with son about home with hospice versus SNF rehab with palliative on d/c.  Hospice philosophy of care reviewed with patient. She doesn't ask many questions, doesn't share much. She is pleasant, and states that she will discuss further with her son. Request case manager/social worker to follow up with patient and her son regarding desired disposition options.   Code Status:    Code Status Orders  (From admission, onward)         Start      Ordered   06/22/18 1146  Do not attempt resuscitation (DNR)  Continuous    Question Answer Comment  In the event of cardiac or respiratory ARREST Do not call a "code blue"   In the event of cardiac or respiratory ARREST Do not perform Intubation, CPR, defibrillation or ACLS   In the event of cardiac or respiratory ARREST Use medication by any route, position, wound care, and other measures to relive pain and suffering. May use oxygen, suction and manual treatment of airway obstruction as needed for comfort.      06/22/18 1145        Code Status History    Date Active Date Inactive Code Status Order ID Comments User Context   06/22/2018 0032 06/22/2018 1145 Full Code [id number]  Toy Baker, MD Inpatient   09/20/2017 1802 09/22/2017 1952 Full Code [id number]  Donne Hazel, MD ED   09/13/2015 2045 09/15/2015 2145 Full Code [id number]  Etta Quill, DO Inpatient   06/15/2014 1653 06/16/2014 2153 Full Code [id number]  Alphonsa Overall, MD Inpatient    Advance Directive Documentation     Most Recent Value  Type of Advance Directive  Healthcare Power of Attorney  Pre-existing out of facility DNR order (yellow form or pink MOST form)  -  "MOST" Form in Place?  -       Prognosis:   < 6 months  Discharge Planning:  To Be Determined  Care plan was discussed with  Patient.   Thank you for allowing the Palliative Medicine Team to assist in the care of this patient.   Time In: 11 Time Out: 11.25 Total Time 25 Prolonged Time Billed  no       Greater than 50%  of this time was spent counseling and coordinating care related to the above assessment and plan.  Loistine Chance, MD [phone]  Please contact Palliative Medicine Team phone at [phone] for questions and concerns.

## 2018-06-24 DIAGNOSIS — R531 Weakness: Secondary | ICD-10-CM | POA: Diagnosis not present

## 2018-06-24 DIAGNOSIS — E860 Dehydration: Secondary | ICD-10-CM | POA: Diagnosis not present

## 2018-06-24 DIAGNOSIS — R5383 Other fatigue: Secondary | ICD-10-CM | POA: Diagnosis not present

## 2018-06-24 DIAGNOSIS — Z515 Encounter for palliative care: Secondary | ICD-10-CM | POA: Diagnosis not present

## 2018-06-24 DIAGNOSIS — R748 Abnormal levels of other serum enzymes: Secondary | ICD-10-CM | POA: Diagnosis not present

## 2018-06-24 DIAGNOSIS — C50211 Malignant neoplasm of upper-inner quadrant of right female breast: Secondary | ICD-10-CM | POA: Diagnosis not present

## 2018-06-24 LAB — T-HELPER CELLS (CD4) COUNT (NOT AT ARMC)
CD4 T CELL HELPER: 28 % — AB (ref 33–55)
CD4 T Cell Abs: 350 /uL — ABNORMAL LOW (ref 400–2700)

## 2018-06-24 LAB — URINE CULTURE: Culture: >=100000 — AB

## 2018-06-24 LAB — CK: CK TOTAL: 223 U/L (ref 38–234)

## 2018-06-24 MED ORDER — HEPARIN SOD (PORK) LOCK FLUSH 100 UNIT/ML IV SOLN
500.0000 [IU] | INTRAVENOUS | Status: AC | PRN
  Administered 2018-06-24: 500 [IU]

## 2018-06-24 NOTE — Progress Notes (Signed)
Per RN patient returning home with hospice.   Patient does not have a 3 night qualifying stay for SNF. At initial CSW assessment patient not agreeable to SNF.   LCSW signing off. No CSW needs.   Carolin Coy [place]

## 2018-06-24 NOTE — Progress Notes (Signed)
Events noted in the last few days since her hospitalization.  She is a 72 year old woman with advanced breast cancer with documented metastatic disease to the lung, the bone among others.  She has refused systemic chemotherapy continues to do so.  She was hospitalized with failure to thrive and nausea and vomiting.  She has received supportive care and clinically improving.  I agree with the supportive management approach as well as DNR status.  She continues to decline systemic chemotherapy and she will likely require hospice in the near future.  I appreciate the care of the hospitalist team as well as palliative medicine team in addressing her [place].  I have no objections to discharge once felt ready by the primary team.

## 2018-06-24 NOTE — Discharge Summary (Signed)
Physician Discharge Summary  Kelly Chavez ZOX:[id number] DOB: [birth year] DOA: 06/21/2018  PCP: [name], MD  Admit date: 06/21/2018 Discharge date: 06/24/2018  Time spent: 45 minutes  Recommendations for Outpatient Follow-up:  Patient will be discharged to home with home health physical and occupational therapy. Hospice/palliative care to follow. Patient will need to follow up with primary care provider within one week of discharge. Repeat thyroid function tests in 4 weeks.  Patient should continue medications as prescribed.  Patient should follow a heart healthy diet.   Discharge Diagnoses:  Lower extremity weakness and fatigue  Asymptomatic bacteruria  Rhabdomyolysis  Essential Hypertension Abnormal thyroid studies HIV disease Breast cancer with metastasis  Hypokalemia Goals of care  Discharge Condition: Stable   Diet recommendation: heart healthy  Filed Weights   06/21/18 1856 06/22/18 0045  Weight: 78.9 kg 76.2 kg    History of present illness:  On 06/21/2018 by Dr. Nilsa Nutting a 72 y.o.femalewith medical history significant of breast Cancer stage IVmetastatic disease to the lung and bone, colon cancer, HIV disease, HTN, HLD, CVA  Presented withFatigue and nausea with vomiting Slipped off the couch could not get upEMS was called patient was noted to be incontinent because she was too weak to get up to the restroom. Noted to have vomitus on her but refused Zofran. Patient have had progressive weakness over a while patient denies injury reports mild headache no fevers or chills has been having progressive generalized fatigue. No fevers, she reports no chest pain, reports back discomfort  Hospital Course:  Lower extremity weakness and fatigue  -Suspect secondary to cancer versus not taking medications -CXR unremarkable for infection -CT head shows no skull fracture or intracranial hemorrhage. -Cervical spine CT showed no fracture or  subluxation.  Noted to have mild enlargement of previously demonstrated right upper lobe lung nodule, suspicious for metastasis or primary lung cancer.  Mild increase in size of conglomeration of enlarged right supraclavicular nodes, compatible with metastatic nodes.  2 new sclerotic lesions on the right first rib. -PT consulted recommending SNF or 24/7 physical assist at home if patient/son chooses hospice or comfort care. -Suggested MRI however patient would like to think about this. Discussed more with patient today and she stated she knows her cancer has spread, what is the point of doing the MRI.  -Continue pain control and supportive care -not sure patient can care for herself- she wishes to return home. We discussed hospice and comfort care; she states she wanted to think about when she gets home. -OT consulted and recommended [facility] -Will discharge patient with home health physical and occupational therapy as well as hospice.  This was discussed with patient's son via phone.  Asymptomatic bacteruria  -UA showed trace leukocytes, positive nitrites, rare bacteria, 6-10 WBC -Urine culture >100k E coli -patient remains asymptomatic therefore will not treat  Rhabdomyolysis  -CK level on admission 891, currently trending downward, 223 -was placed on IV hydration  Essential Hypertension -Continue atenolol  Abnormal thyroid studies -TSH 11.093, FT4 0.47 -admits to not taking medications in a while- only wanted to take natural things  -Continue Armour  HIV disease -Continue Biktarvy  -HIV-1 RNA not detected <20 -CD4 350  Breast cancer with metastasis  -See CT scan as listed above  Hypokalemia -Replaced, continue to monitor BMP -Magnesium checked, currently 2.1  Goals of care -Palliative care consulted and appreciated.  Discussed with patient's son via phone as well as patient, CODE STATUS now DNR. -Discussed with son via phone,  the need for hospice at home.  Case management  consult placed.  Procedures: none  Consultations: Palliative care Oncology  Discharge Exam: Vitals:   06/23/18 2054 06/24/18 0515  BP: 138/65 128/63  Pulse: 63 (!) 58  Resp: 17 16  Temp: 98.7 F (37.1 C) 98.1 F (36.7 C)  SpO2: 96% 99%   She has no complaints today.  States she is ready to go home.  Denies current chest pain, shortness of breath, abdominal pain, nausea or vomiting, diarrhea constipation.   General: Well developed, well nourished, NAD, appears stated age  24: NCAT, mucous membranes moist.  Neck: Supple  Cardiovascular: S1 S2 auscultated, no murmur, RRR  Respiratory: Clear to auscultation bilaterally with equal chest rise  Abdomen: Soft, nontender, nondistended, + bowel sounds  Extremities: warm dry without cyanosis clubbing or edema  Neuro: AAOx3, generally weak, nonfocal  Psych: Normal affect and demeanor, pleasant  Discharge Instructions Discharge Instructions    Discharge instructions   Complete by:  As directed    Patient will be discharged to home with home health physical and occupational therapy.  Patient will need to follow up with primary care provider within one week of discharge. Repeat thyroid function tests in 4 weeks.  Patient should continue medications as prescribed.  Patient should follow a heart healthy diet.     Allergies as of 06/24/2018   No Known Allergies     Medication List    STOP taking these medications   capecitabine 500 MG tablet Commonly known as:  XELODA     TAKE these medications   acetaminophen 325 MG tablet Commonly known as:  TYLENOL Take 2 tablets (650 mg total) by mouth every 6 (six) hours as needed for mild pain (or Fever >/= 101).   AIRBORNE PO Take 1 tablet by mouth daily.   ARMOUR THYROID 60 MG tablet Generic drug:  thyroid Take 60 mg by mouth daily before breakfast.   atenolol 50 MG tablet Commonly known as:  TENORMIN TAKE 1 TABLET BY MOUTH EVERY DAY   BIKTARVY 50-200-25 MG Tabs  tablet Generic drug:  bictegravir-emtricitabine-tenofovir AF Take 1 tablet by mouth daily.   lidocaine-prilocaine cream Commonly known as:  EMLA Apply a dime size to port 1-2 hours prior to access. Cover with [employer].   VITAMIN D PO Take 1 tablet by mouth daily.      No Known Allergies Follow-up Information    [name], MD. Schedule an appointment as soon as possible for a visit in 1 week(s).   Specialty:  Internal Medicine Why:  Hospital follow up Contact information: [place] [address] [phone]            The results of significant diagnostics from this hospitalization (including imaging, microbiology, ancillary and laboratory) are listed below for reference.    Significant Diagnostic Studies: Dg Chest 2 View  Result Date: 06/21/2018 CLINICAL DATA:  Generalized weakness and emesis.  Cough for 3 weeks. EXAM: CHEST - 2 VIEW COMPARISON:  CT chest 02/08/2018 FINDINGS: The heart size is upper limits of normal. It is exaggerated by low lung volumes. A left subclavian Port-A-Cath is in place. The tip is at the cavoatrial junction. Surgical clips are present at the right axilla. Lungs are clear. No focal nodule, mass, or airspace disease is present. The visualized soft tissues and bony thorax are unremarkable otherwise. IMPRESSION: 1. Borderline cardiomegaly without failure. 2. No acute cardiopulmonary disease. 3. Left subclavian Port-A-Cath terminates at the cavoatrial junction.  Electronically Signed   By: San Morelle M.D.   On: 06/21/2018 20:30   Ct Head Wo Contrast  Result Date: 06/21/2018 CLINICAL DATA:  Generalized weakness, vomiting and incontinence. The patient slid to the floor earlier today and was unable to get up. Stage IV breast cancer. EXAM: CT HEAD WITHOUT CONTRAST CT CERVICAL SPINE WITHOUT CONTRAST TECHNIQUE: Multidetector CT imaging of the head and cervical spine was performed following the standard protocol  without intravenous contrast. Multiplanar CT image reconstructions of the cervical spine were also generated. COMPARISON:  PET-CT dated 08/31/2017. Brain MR dated 09/13/2015. Head CT dated 09/13/2015. FINDINGS: CT HEAD FINDINGS Brain: Stable mildly enlarged ventricles and cortical sulci. Stable mild patchy white matter low density in both cerebral hemispheres. No intracranial hemorrhage, mass lesion or CT evidence of acute infarction. Vascular: No hyperdense vessel or unexpected calcification. Skull: Bilateral hyperostosis frontalis. Sinuses/Orbits: Unremarkable. Other: None. CT CERVICAL SPINE FINDINGS Alignment: Reversal the normal lordosis. No subluxations. Skull base and vertebrae: No acute fracture. No primary bone lesion or focal pathologic process. Soft tissues and spinal canal: No prevertebral fluid or swelling. No visible canal hematoma. Disc levels: Multilevel degenerative changes. These are producing varying degrees of spinal stenosis at multiple levels. Upper chest: The previously demonstrated right upper lobe lung nodule is partially included and appears somewhat more irregular. The included portion measures 1.6 cm in maximum diameter, previously 1.3 cm on 02/08/2018. The previously demonstrated conglomeration of right supraclavicular lymph nodes is partially included and may be larger. Two interval sclerotic lesions in the right 1st rib. Other: Bilateral carotid artery calcifications. IMPRESSION: 1. No skull fracture or intracranial hemorrhage. 2. No cervical spine fracture or subluxation. 3. Stable atrophy and chronic small vessel white matter ischemic changes. 4. Multilevel cervical spine degenerative changes with varying degrees of spinal stenosis at multiple levels. 5. Interval mild enlargement of the previously demonstrated right upper lobe lung nodule, suspicious for a metastasis or primary lung cancer. 6. Probable mild increase in size of a conglomeration of enlarged right supraclavicular nodes,  compatible with metastatic nodes. 7. Two new sclerotic lesions in the right 1st rib, compatible with metastases. 8. Bilateral carotid artery atheromatous calcifications. Electronically Signed   By: Claudie Revering M.D.   On: 06/21/2018 21:05   Ct Cervical Spine Wo Contrast  Result Date: 06/21/2018 CLINICAL DATA:  Generalized weakness, vomiting and incontinence. The patient slid to the floor earlier today and was unable to get up. Stage IV breast cancer. EXAM: CT HEAD WITHOUT CONTRAST CT CERVICAL SPINE WITHOUT CONTRAST TECHNIQUE: Multidetector CT imaging of the head and cervical spine was performed following the standard protocol without intravenous contrast. Multiplanar CT image reconstructions of the cervical spine were also generated. COMPARISON:  PET-CT dated 08/31/2017. Brain MR dated 09/13/2015. Head CT dated 09/13/2015. FINDINGS: CT HEAD FINDINGS Brain: Stable mildly enlarged ventricles and cortical sulci. Stable mild patchy white matter low density in both cerebral hemispheres. No intracranial hemorrhage, mass lesion or CT evidence of acute infarction. Vascular: No hyperdense vessel or unexpected calcification. Skull: Bilateral hyperostosis frontalis. Sinuses/Orbits: Unremarkable. Other: None. CT CERVICAL SPINE FINDINGS Alignment: Reversal the normal lordosis. No subluxations. Skull base and vertebrae: No acute fracture. No primary bone lesion or focal pathologic process. Soft tissues and spinal canal: No prevertebral fluid or swelling. No visible canal hematoma. Disc levels: Multilevel degenerative changes. These are producing varying degrees of spinal stenosis at multiple levels. Upper chest: The previously demonstrated right upper lobe lung nodule is partially included and appears somewhat more irregular. The included  portion measures 1.6 cm in maximum diameter, previously 1.3 cm on 02/08/2018. The previously demonstrated conglomeration of right supraclavicular lymph nodes is partially included and may be  larger. Two interval sclerotic lesions in the right 1st rib. Other: Bilateral carotid artery calcifications. IMPRESSION: 1. No skull fracture or intracranial hemorrhage. 2. No cervical spine fracture or subluxation. 3. Stable atrophy and chronic small vessel white matter ischemic changes. 4. Multilevel cervical spine degenerative changes with varying degrees of spinal stenosis at multiple levels. 5. Interval mild enlargement of the previously demonstrated right upper lobe lung nodule, suspicious for a metastasis or primary lung cancer. 6. Probable mild increase in size of a conglomeration of enlarged right supraclavicular nodes, compatible with metastatic nodes. 7. Two new sclerotic lesions in the right 1st rib, compatible with metastases. 8. Bilateral carotid artery atheromatous calcifications. Electronically Signed   By: Claudie Revering M.D.   On: 06/21/2018 21:05   Dg Abd 2 Views  Result Date: 06/21/2018 CLINICAL DATA:  Vomiting and in an adult. EXAM: ABDOMEN - 2 VIEW COMPARISON:  CT abdomen and pelvis FINDINGS: Bowel gas pattern is normal. There is no obstruction or free air. 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 IMPRESSION: 1. Normal bowel gas pattern. 2. Acetabular protrusio bilaterally. Electronically Signed   By: San Morelle M.D.   On: 06/21/2018 20:33    Microbiology: Recent Results (from the past 240 hour(s))  Urine culture     Status: Abnormal   Collection Time: 06/21/18 10:24 PM  Result Value Ref Range Status   Specimen Description   Final    URINE, RANDOM Performed at [facility], [place] [address], [place], [place] [address]    Special Requests   Final    NONE Performed at [facility], [place] [address] [place], [place] [address]    Culture >=100,000 COLONIES/mL ESCHERICHIA COLI (A)  Final   Report Status 06/24/2018 FINAL  Final   Organism ID, Bacteria ESCHERICHIA COLI (A)  Final       Susceptibility   Escherichia coli - MIC*    AMPICILLIN >=32 RESISTANT Resistant     CEFAZOLIN <=4 SENSITIVE Sensitive     CEFTRIAXONE <=1 SENSITIVE Sensitive     CIPROFLOXACIN >=4 RESISTANT Resistant     GENTAMICIN <=1 SENSITIVE Sensitive     IMIPENEM <=0.25 SENSITIVE Sensitive     NITROFURANTOIN <=16 SENSITIVE Sensitive     TRIMETH/SULFA <=20 SENSITIVE Sensitive     AMPICILLIN/SULBACTAM >=32 RESISTANT Resistant     PIP/TAZO <=4 SENSITIVE Sensitive     Extended ESBL NEGATIVE Sensitive     * >=100,000 COLONIES/mL ESCHERICHIA COLI     Labs: Basic Metabolic Panel: Recent Labs  Lab 06/21/18 1921 06/21/18 2010 06/22/18 0152 06/22/18 0808 06/23/18 0813  NA 143 141  --  143 145  K 3.5 3.6  --  3.1* 3.8  CL 107 105  --  109 113*  CO2 24  --   --  25 25  GLUCOSE 106* 107*  --  69* 78  BUN 14 15  --  12 13  CREATININE 0.98 0.90  --  0.83 0.84  CALCIUM 9.5  --   --  8.5* 8.8*  MG  --   --  2.0 2.1  --   PHOS  --   --  2.9 2.9  --    Liver Function Tests: Recent Labs  Lab 06/21/18 1921 06/22/18 0808 06/23/18 0813  AST 37 29 28  ALT 22 17 19   ALKPHOS 123 96 98  BILITOT 0.6 0.5 0.4  PROT 7.7 6.4* 6.5  ALBUMIN 3.8 2.9* 2.9*   No results for input(s): LIPASE, AMYLASE in the last 168 hours. No results for input(s): AMMONIA in the last 168 hours. CBC: Recent Labs  Lab 06/21/18 1921 06/21/18 2010 06/22/18 0808 06/23/18 0813  WBC 6.6  --  5.5 4.7  NEUTROABS 5.1  --   --   --   HGB 13.2 13.3 11.4* 11.9*  HCT 40.0 39.0 35.2* 37.2  MCV 93.5  --  93.9 95.1  PLT 219  --  192 203   Cardiac Enzymes: Recent Labs  Lab 06/21/18 1921 06/22/18 0152 06/22/18 0808 06/22/18 1355 06/23/18 0813 06/24/18 0513  CKTOTAL 891*  --  703*  --  409* 223  TROPONINI 0.03* 0.03* 0.03* 0.03*  --   --    BNP: BNP (last 3 results) No results for input(s): BNP in the last 8760 hours.  ProBNP (last 3 results) No results for input(s): PROBNP in the last 8760 hours.  CBG: Recent Labs    Lab 06/21/18 1953  GLUCAP 95       Signed:  Cristal Ford  [facility] 06/24/2018, 1:26 PM

## 2018-06-24 NOTE — Progress Notes (Signed)
Daily Progress Note   Patient Name: Kelly Chavez       Date: 06/24/2018 DOB: November 17, 1945  Age: 72 y.o. MRN#: [id number] Attending Physician: Cristal Ford, DO Primary Care Physician: [name], MD Admit Date: 06/21/2018  Reason for Consultation/Follow-up: Establishing goals of care  Subjective:  Patient is awake alert resting in bed. She tells me she is going home There is no family at bedside See below:  Length of Stay: 0  Current Medications: Scheduled Meds:  . atenolol  50 mg Oral Daily  . bictegravir-emtricitabine-tenofovir AF  1 tablet Oral Daily  . pneumococcal 23 valent vaccine  0.5 mL Intramuscular Tomorrow-1000  . thyroid  60 mg Oral QAC breakfast    Continuous Infusions:   PRN Meds: acetaminophen **OR** acetaminophen, HYDROcodone-acetaminophen, morphine injection, ondansetron **OR** ondansetron (ZOFRAN) IV, polyethylene glycol, sodium chloride flush  Physical Exam         Awake alert In no distress Appears chronically ill Clear breath sounds S1 S2 Abdomen is not distended Has no edema Awake alert oriented Appears to have generalized weakness  Vital Signs: BP 128/63 (BP Location: Left Arm)   Pulse (!) 58   Temp 98.1 F (36.7 C) (Oral)   Resp 16   Ht 5\' 3"  (1.6 m)   Wt 76.2 kg   SpO2 99%   BMI 29.76 kg/m  SpO2: SpO2: 99 % O2 Device: O2 Device: Room Air O2 Flow Rate:    Intake/output summary:   Intake/Output Summary (Last 24 hours) at 06/24/2018 1339 Last data filed at 06/24/2018 1134 Gross per 24 hour  Intake 600 ml  Output 400 ml  Net 200 ml   LBM: Last BM Date: 06/22/18 Baseline Weight: Weight: 78.9 kg Most recent weight: Weight: 76.2 kg       Palliative Assessment/Data:    Flowsheet Rows     Most Recent Value  Intake Tab    Referral Department  Hospitalist  Unit at Time of Referral  ER  Palliative Care Primary Diagnosis  Cancer  Date Notified  06/22/18  Palliative Care Type  New Palliative care  Reason for referral  Clarify Goals of Care  Date of Admission  06/21/18  Date first seen by Palliative Care  06/22/18  # of days Palliative referral response time  0 Day(s)  # of days IP prior to  Palliative referral  1  Clinical Assessment  Psychosocial & Spiritual Assessment  Palliative Care Outcomes    PPS 40%  Patient Active Problem List   Diagnosis Date Noted  . Dehydration 06/21/2018  . Rhabdomyolysis 06/21/2018  . Elevated troponin 06/21/2018  . Bilateral lower leg cellulitis 09/20/2017  . Weakness 09/20/2017  . Port catheter in place 06/28/2016  . Spastic hemiplegia affecting nondominant side ([place]) 11/29/2015  . Cerebral infarction due to thrombosis of basilar artery ([place])   . Acute CVA (cerebrovascular accident) ([place]) 09/14/2015  . CVA (cerebral infarction) 09/13/2015  . Hyperlipidemia 05/31/2014  . Pedal edema 05/11/2014  . Paranoia ([place]) 05/11/2014  . Breast cancer of upper-inner quadrant of right female breast ([place]) 04/15/2014  . History of pituitary tumor 01/28/2014  . Avascular necrosis of bones of both hips ([place]) 01/28/2014  . Genital herpes 01/28/2014  . Ovarian cyst, bilateral 01/26/2014  . Essential hypertension, benign 01/26/2014  . Unspecified vitamin D deficiency 01/26/2014  . DJD (degenerative joint disease) 01/26/2014  . 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 History of HIV HTN HLD CVA Generalized weakness Functional decline   Recommendations/Plan:   Patient to be d/c home today She doesn't want to go to SNF Agree that the patient will need full scope of hospice support in the near future No additional PMT specific recommendations at this time Appreciate  med onc Dr Alen Blew input and recommendations   Code Status:    Code Status Orders  (From admission, onward)         Start     Ordered   06/22/18 1146  Do not attempt resuscitation (DNR)  Continuous    Question Answer Comment  In the event of cardiac or respiratory ARREST Do not call a "code blue"   In the event of cardiac or respiratory ARREST Do not perform Intubation, CPR, defibrillation or ACLS   In the event of cardiac or respiratory ARREST Use medication by any route, position, wound care, and other measures to relive pain and suffering. May use oxygen, suction and manual treatment of airway obstruction as needed for comfort.      06/22/18 1145        Code Status History    Date Active Date Inactive Code Status Order ID Comments User Context   06/22/2018 0032 06/22/2018 1145 Full Code [id number]  Toy Baker, MD Inpatient   09/20/2017 1802 09/22/2017 1952 Full Code [id number]  Donne Hazel, MD ED   09/13/2015 2045 09/15/2015 2145 Full Code [id number]  Etta Quill, DO Inpatient   06/15/2014 1653 06/16/2014 2153 Full Code [id number]  Alphonsa Overall, MD Inpatient    Advance Directive Documentation     Most Recent Value  Type of Advance Directive  Healthcare Power of Attorney  Pre-existing out of facility DNR order (yellow form or pink MOST form)  -  "MOST" Form in Place?  -       Prognosis:   < 6 months  Discharge Planning:  Home with hospice  Care plan was discussed with  Patient.   Thank you for allowing the Palliative Medicine Team to assist in the care of this patient.   Time In: 11 Time Out: 11.15 Total Time 15 Prolonged Time Billed  no       Greater than 50%  of this time was spent counseling and coordinating care related to the  above assessment and plan.  Loistine Chance, MD [phone]  Please contact Palliative Medicine Team phone at [phone] for questions and concerns.

## 2018-06-24 NOTE — Evaluation (Signed)
Occupational Therapy Evaluation Patient Details Name: Kelly Chavez MRN: [id number] DOB: [birth year] Today's Date: 06/24/2018    History of Present Illness 72yo female with history of recent fall off of her couch, could not get up and called EMS. PMH breast CA stage IV metastatic to lung and bone, colon CA, HIV, CVA, avascular necrosis B hips, hx brain tumor    Clinical Impression   Pt admitted with the above diagnosis and has the deficits listed below. Pt would benefit from continued OT at home home with her son to improve independence on sit to stand transfers and improve independence on some basic adls so she can remain somewhat independent as she has personal goals for this for her well being.  Pt's son has moved her from [place] to live with his mother during this time.      Follow Up Recommendations  Home health OT;Supervision/Assistance - 24 hour    Equipment Recommendations  None recommended by OT    Recommendations for Other Services       Precautions / Restrictions Precautions Precautions: Fall Restrictions Weight Bearing Restrictions: No Other Position/Activity Restrictions: Pt with difficulty flexing at hips which makes sit to stand difficult.  If you block B knees and have +2 to get pt to her feet, she does well once in standing.      Mobility Bed Mobility Overal bed mobility: Needs Assistance Bed Mobility: Supine to Sit     Supine to sit: Max assist;HOB elevated     General bed mobility comments: Pt continues with posterior lean.  When [facility] elevated and B feet blocked, pt was able to correct posterior lean to some degree.   Transfers Overall transfer level: Needs assistance Equipment used: Rolling walker (2 wheeled);2 person hand held assist Transfers: Stand Pivot Transfers;Sit to/from Stand Sit to Stand: Mod assist;+2 physical assistance;From elevated surface(B knees blocked) Stand pivot transfers: Min guard;+2 physical assistance       General transfer  comment: Pt once on feet transferred well; difficulty coming sit to stand due to posterior lean in sitting.    Balance Overall balance assessment: Needs assistance;History of Falls Sitting-balance support: Feet supported;Bilateral upper extremity supported Sitting balance-Leahy Scale: Poor   Postural control: Posterior lean Standing balance support: Bilateral upper extremity supported;During functional activity Standing balance-Leahy Scale: Poor Standing balance comment: Pt able to stand with walker                           ADL either performed or assessed with clinical judgement   ADL Overall ADL's : Needs assistance/impaired Eating/Feeding: Set up;Sitting   Grooming: Set up;Sitting   Upper Body Bathing: Set up;Sitting   Lower Body Bathing: Maximal assistance;Sit to/from stand   Upper Body Dressing : Minimal assistance;Sitting   Lower Body Dressing: Maximal assistance;Sit to/from stand   Toilet Transfer: Moderate assistance;+2 for physical assistance;Stand-pivot;BSC;RW   Toileting- Clothing Manipulation and Hygiene: Maximal assistance;Sit to/from stand       Functional mobility during ADLs: Minimal assistance;Rolling walker General ADL Comments: Once pt is on feet, she is slow to move but does not need much physical assist.  GEtting to her feet is difficult as she has difficulty flexing at the hips.     Vision Baseline Vision/History: Wears glasses Wears Glasses: At all times Patient Visual Report: No change from baseline Vision Assessment?: No apparent visual deficits     Perception Perception Perception Tested?: No   Praxis Praxis Praxis tested?: Not tested  Pertinent Vitals/Pain Pain Assessment: Faces Faces Pain Scale: Hurts a little bit Pain Location: all over, general pain  Pain Descriptors / Indicators: Aching;Sore Pain Intervention(s): Limited activity within patient's tolerance;Monitored during session;Repositioned     Hand Dominance  Right   Extremity/Trunk Assessment Upper Extremity Assessment Upper Extremity Assessment: Generalized weakness   Lower Extremity Assessment Lower Extremity Assessment: Defer to PT evaluation   Cervical / Trunk Assessment Cervical / Trunk Assessment: Kyphotic   Communication Communication Communication: No difficulties   Cognition Arousal/Alertness: Awake/alert Behavior During Therapy: Flat affect Overall Cognitive Status: Within Functional Limits for tasks assessed                                     General Comments  Pt very independent minded.  Does not have insight to the severity of her disability at this time therefore would be unsafe at home alone. Pt is able to do more than she appears she can do but will be a fall risk if left at home alone.    Exercises     Shoulder Instructions      Home Living Family/patient expects to be discharged to:: Private residence Living Arrangements: Alone Available Help at Discharge: Family;Neighbor;Available 24 hours/day Type of Home: Apartment Home Access: Level entry     Home Layout: One level     Bathroom Shower/Tub: Teacher, early years/pre: Handicapped height Bathroom Accessibility: Yes How Accessible: Accessible via wheelchair Home Equipment: [place] - 2 wheels;Walker - 4 wheels;Shower seat;Bedside commode   Additional Comments: Son just quit job in [place] and is here to live and care for mother.      Prior Functioning/Environment Level of Independence: Independent with assistive device(s)        Comments: Patient reports she drives to get groceries and cooks her own meals but it's getting more difficult to care for herself        OT Problem List: Decreased strength;Decreased range of motion;Decreased activity tolerance;Impaired balance (sitting and/or standing);Decreased knowledge of use of DME or AE;Decreased safety awareness;Pain      OT Treatment/Interventions: Self-care/ADL  training;Therapeutic activities;DME and/or AE instruction    OT Goals(Current goals can be found in the care plan section) Acute Rehab OT Goals Patient Stated Goal: to get stronger, not fall  OT Goal Formulation: With patient Time For Goal Achievement: 07/08/18 Potential to Achieve Goals: Fair ADL Goals Pt Will Perform Upper Body Bathing: with supervision;sitting Pt Will Perform Lower Body Bathing: with mod assist;sit to/from stand Pt Will Transfer to Toilet: with min assist;stand pivot transfer;ambulating;bedside commode  OT Frequency: Min 2X/week   Barriers to D/C:    son moved here to care for mother       Co-evaluation              AM-PAC PT "6 Clicks" Daily Activity     Outcome Measure Help from another person eating meals?: None Help from another person taking care of personal grooming?: None Help from another person toileting, which includes using toliet, bedpan, or urinal?: A Lot Help from another person bathing (including washing, rinsing, drying)?: A Lot Help from another person to put on and taking off regular upper body clothing?: A Lot Help from another person to put on and taking off regular lower body clothing?: A Lot 6 Click Score: 16   End of Session Equipment Utilized During Treatment: Rolling walker;Gait belt Nurse Communication: Mobility status  Activity Tolerance: Patient tolerated treatment well Patient left: in chair;with call bell/phone within reach;with family/visitor present  OT Visit Diagnosis: Unsteadiness on feet (R26.81);Repeated falls (R29.6);Muscle weakness (generalized) (M62.81);Pain Pain - part of body: (all over)                Time: 1140-1210 OT Time Calculation (min): 30 min Charges:  OT General Charges $OT Visit: 1 Visit OT Evaluation $OT Eval Moderate Complexity: 1 Mod OT Treatments $Self Care/Home Management : 8-22 mins  Jinger Neighbors, OTR/L [phone]  [place], [place] 06/24/2018, 12:26 PM

## 2018-06-24 NOTE — Care Management Note (Signed)
Case Management Note  Patient Details  Name: Kelly Chavez MRN: [id number] Date of Birth: 19-Feb-1946  Subjective/Objective:                  discharged  Action/Plan: hhc-pt,ot =advanced hhc  Expected Discharge Date:  06/24/18               Expected Discharge Plan:  [place]  In-House Referral:     Discharge planning Services  CM Consult  Post Acute Care Choice:  Home Health Choice offered to:  Patient  DME Arranged:    DME Agency:     HH Arranged:  PT, OT HH Agency:  [facility]  Status of Service:  Completed, signed off  If discussed at [place] of Stay Meetings, dates discussed:    Additional Comments:  Leeroy Cha, RN 06/24/2018, 2:04 PM

## 2018-06-24 NOTE — Progress Notes (Signed)
Initial Nutrition Assessment  INTERVENTION:   Encourage PO intakes  NUTRITION DIAGNOSIS:   Increased nutrient needs related to cancer and cancer related treatments as evidenced by estimated needs.  GOAL:   Patient will meet greater than or equal to 90% of their needs  MONITOR:   PO intake, Labs, Weight trends, I & O's  REASON FOR ASSESSMENT:   Consult Assessment of nutrition requirement/status  ASSESSMENT:   72 y.o. female with medical history significant of breast Cancer stage IV  metastatic disease to the lung and bone , colon cancer, HIV disease, HTN, HLD, CVA. Admitted for weakness and fatigue.   Patient consuming 100% of most meals during this admission. Pt reports no wt loss or poor appetite at this time. Per palliative care note, pt with prognosis of <6 months but patient remains full code. She is refusing more chemotherapy.   Per weight records, pt has lost 6 lb since March 2019 (3% wt loss x 6 months, insignificant for time frame).   Labs reviewed. Medications reviewed.  NUTRITION - FOCUSED PHYSICAL EXAM:  Deferred.  Diet Order:   Diet Order            Diet Heart Room service appropriate? Yes; Fluid consistency: Thin  Diet effective now              EDUCATION NEEDS:   Not appropriate for education at this time  Skin:  Skin Assessment: Reviewed RN Assessment  Last BM:  9/7  Height:   Ht Readings from Last 1 Encounters:  06/22/18 5\' 3"  (1.6 m)    Weight:   Wt Readings from Last 1 Encounters:  06/22/18 76.2 kg    Ideal Body Weight:  52.3 kg  BMI:  Body mass index is 29.76 kg/m.  Estimated Nutritional Needs:   Kcal:  1350-1550  Protein:  65-75g  Fluid:  1.5L/day  Clayton Bibles, MS, RD, LDN [place] Dietitian Pager: [phone] After Hours Pager: [phone]

## 2018-07-01 ENCOUNTER — Other Ambulatory Visit: Payer: Self-pay | Admitting: *Deleted

## 2018-07-01 DIAGNOSIS — B20 Human immunodeficiency virus [HIV] disease: Secondary | ICD-10-CM

## 2018-07-01 MED ORDER — BICTEGRAVIR-EMTRICITAB-TENOFOV 50-200-25 MG PO TABS: 1.0000 | ORAL_TABLET | Freq: Every day | ORAL | 5 refills | Status: AC

## 2018-07-02 ENCOUNTER — Ambulatory Visit (INDEPENDENT_AMBULATORY_CARE_PROVIDER_SITE_OTHER): Payer: Medicare Other | Admitting: Internal Medicine

## 2018-07-02 VITALS — Wt 167.1 lb

## 2018-07-02 DIAGNOSIS — B20 Human immunodeficiency virus [HIV] disease: Secondary | ICD-10-CM | POA: Diagnosis present

## 2018-07-02 DIAGNOSIS — C50011 Malignant neoplasm of nipple and areola, right female breast: Secondary | ICD-10-CM | POA: Diagnosis not present

## 2018-07-02 NOTE — Progress Notes (Signed)
Patient ID: Kelly Chavez, female   DOB: 11/20/1945, 72 y.o.   MRN: [id number]  HPI Kelly Chavez 72yo F with well controlled hiv disease, metastatic breast ca, and htn. Recently hospitalized for deconditioning. Choosing to still not do further care for metastatic breast cA. Continues to take her bitkarvy  Outpatient Encounter Medications as of 07/02/2018  Medication Sig  . acetaminophen (TYLENOL) 325 MG tablet Take 2 tablets (650 mg total) by mouth every 6 (six) hours as needed for mild pain (or Fever >/= 101).  Marland Kitchen atenolol (TENORMIN) 50 MG tablet TAKE 1 TABLET BY MOUTH EVERY DAY  . bictegravir-emtricitabine-tenofovir AF (BIKTARVY) 50-200-25 MG TABS tablet Take 1 tablet by mouth daily.  . Cholecalciferol (VITAMIN D PO) Take 1 tablet by mouth daily.  Marland Kitchen lidocaine-prilocaine (EMLA) cream Apply a dime size to port 1-2 hours prior to access. Cover with [employer].  . Multiple Vitamins-Minerals (AIRBORNE PO) Take 1 tablet by mouth daily.  Marland Kitchen thyroid (ARMOUR THYROID) 60 MG tablet Take 60 mg by mouth daily before breakfast.    Facility-Administered Encounter Medications as of 07/02/2018  Medication  . baclofen (LIORESAL) tablet 5 mg     Patient Active Problem List   Diagnosis Date Noted  . Dehydration 06/21/2018  . Rhabdomyolysis 06/21/2018  . Elevated troponin 06/21/2018  . Bilateral lower leg cellulitis 09/20/2017  . Weakness 09/20/2017  . Port catheter in place 06/28/2016  . Spastic hemiplegia affecting nondominant side ([place]) 11/29/2015  . Cerebral infarction due to thrombosis of basilar artery ([place])   . Acute CVA (cerebrovascular accident) ([place]) 09/14/2015  . CVA (cerebral infarction) 09/13/2015  . Hyperlipidemia 05/31/2014  . Pedal edema 05/11/2014  . Paranoia ([place]) 05/11/2014  . Breast cancer of upper-inner quadrant of right female breast ([place]) 04/15/2014  . History of pituitary tumor 01/28/2014  . Avascular necrosis of bones of both hips ([place]) 01/28/2014  . Genital herpes  01/28/2014  . Ovarian cyst, bilateral 01/26/2014  . Essential hypertension, benign 01/26/2014  . Unspecified vitamin D deficiency 01/26/2014  . DJD (degenerative joint disease) 01/26/2014  . HIV disease ([place]) 01/07/2014     Health Maintenance Due  Topic Date Due  . TETANUS/TDAP  10/21/1965  . COLONOSCOPY  10/21/1996  . DEXA SCAN  10/22/2011  . PNA vac Low Risk Adult (2 of 2 - PCV13) 01/23/2015  . INFLUENZA VACCINE  05/16/2018     Review of Systems Review of Systems  Constitutional: Negative for fever, chills, diaphoresis, activity change, appetite change, fatigue and unexpected weight change.  HENT: Negative for congestion, sore throat, rhinorrhea, sneezing, trouble swallowing and sinus pressure.  Eyes: Negative for photophobia and visual disturbance.  Respiratory: Negative for cough, chest tightness, shortness of breath, wheezing and stridor.  Cardiovascular: Negative for chest pain, palpitations and leg swelling.  Gastrointestinal: Negative for nausea, vomiting, abdominal pain, diarrhea, constipation, blood in stool, abdominal distention and anal bleeding.  Genitourinary: Negative for dysuria, hematuria, flank pain and difficulty urinating.  Musculoskeletal: Negative for myalgias, back pain, joint swelling, arthralgias and gait problem.  Skin: Negative for color change, pallor, rash and wound.  Neurological: Negative for dizziness, tremors, weakness and light-headedness.  Hematological: Negative for adenopathy. Does not bruise/bleed easily.  Psychiatric/Behavioral: Negative for behavioral problems, confusion, sleep disturbance, dysphoric mood, decreased concentration and agitation.    Physical Exam   Wt 167 lb 1.9 oz (75.8 kg)   BMI 29.60 kg/m   Physical Exam  Constitutional:  oriented to person, place, and time. appears well-developed and well-nourished. No distress.  HENT: [place]/AT, PERRLA, no scleral icterus Mouth/Throat: Oropharynx is clear and moist. No oropharyngeal  exudate.  Cardiovascular: Normal rate, regular rhythm and normal heart sounds. Exam reveals no gallop and no friction rub.  No murmur heard.  Pulmonary/Chest: Effort normal and breath sounds normal. No respiratory distress.  has no wheezes.  Neck = supple, no nuchal rigidity Abdominal: Soft. Bowel sounds are normal.  exhibits no distension. There is no tenderness.  Lymphadenopathy: no cervical adenopathy. No axillary adenopathy Neurological: alert and oriented to person, place, and time.  Skin: Skin is warm and dry. No rash noted. No erythema.  Psychiatric: a normal mood and affect.  behavior is normal.  Lab Results  Component Value Date   CD4TCELL 28 (L) 06/22/2018   Lab Results  Component Value Date   CD4TABS 350 (L) 06/22/2018   CD4TABS 340 (L) 08/02/2017   CD4TABS 500 08/17/2015   Lab Results  Component Value Date   HIV1RNAQUANT <20 06/22/2018   Lab Results  Component Value Date   HEPBSAB NEG 01/22/2014   Lab Results  Component Value Date   LABRPR NON-REACTIVE 08/02/2017    CBC Lab Results  Component Value Date   WBC 4.7 06/23/2018   RBC 3.91 06/23/2018   HGB 11.9 (L) 06/23/2018   HCT 37.2 06/23/2018   PLT 203 06/23/2018   MCV 95.1 06/23/2018   MCH 30.4 06/23/2018   MCHC 32.0 06/23/2018   RDW 14.1 06/23/2018   LYMPHSABS 0.8 06/21/2018   MONOABS 0.7 06/21/2018   EOSABS 0.0 06/21/2018    BMET Lab Results  Component Value Date   NA 145 06/23/2018   K 3.8 06/23/2018   CL 113 (H) 06/23/2018   CO2 25 06/23/2018   GLUCOSE 78 06/23/2018   BUN 13 06/23/2018   CREATININE 0.84 06/23/2018   CALCIUM 8.8 (L) 06/23/2018   GFRNONAA >60 06/23/2018   GFRAA >60 06/23/2018      Assessment and Plan Well controlled, though unclear if she plans to stop taking hiv medications or not. She appears still would like to take meds but not interested in taking any treatment for metastatic breast ca  Offered to see if she would want ambre, community nurse to do home visits,  but she has deferred this option for now.  See back in 2-3 months

## 2018-07-05 ENCOUNTER — Telehealth: Payer: Self-pay

## 2018-07-05 NOTE — Telephone Encounter (Signed)
Visit scheduled with Palliative Care for 07/10/18

## 2018-07-07 NOTE — Progress Notes (Signed)
PALLIATIVE CARE CONSULT VISIT   PATIENT NAME: Kelly Chavez DOB: 07/04/1946 MRN: [id number]  PRIMARY CARE PROVIDER:   [name], MD  REFERRING PROVIDER:  [name], MD [address] [place], [place] [address]  Dr. Zola Button (oncology)  RESPONSIBLE PARTY:   Walburga Hudman (son) [phone] (home/mobile) IMPRESSION/RECOMMENDATIONS:  1. Advanced Care Directives: Past wishes for DNR but patient wishes to reconsider at this time. We discussed MOST form.             A. Ongoing discussions Benefits vs. Burdens aggressive interventions setting of her illness.   2. Progressive weakness r/t cancer. Currently PPS weak 40%.   3. Metastatic breast cancer; wishes to forgo treatment at this time. She has some underlying elements of delusions and paranoid that may be playing into this decision.  4. HIV: well controlled. She carries guilt and shame associated with diagnosis. She refused f/u with our [facility] LCSW for supportive counseling.  5. Follow up: PC NP visit 2-3 weeks, further discussions advanced care planning.   I spent 90  minutes providing this consultation (from 12pm to 1:30pm). More than 50% of that time was spent coordinating communication.   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 H/O HTN. She was recently hospitalized for deconditioning. She wishes no further diagnostic w/u or treatment for her cancer. She continues to take her bitkarvy. Palliative Care was asked to help address goals of care.   CODE STATUS: Formerly DNR, but patient now expresses wish for full resuscitative efforts.  ASSESSMENT: Patient's PPS is a weak 40%. She ambulates with a slow shuffling gait with walker or 2 canes. She is barely independent in ADLs and IDLs; completes personal hygiene,  dressing, and transfers but finds these are taking her increasingly long to accomplish. She has aide services through [facility] but she canceled this morning as she slept in.She notes progressive weakness and fatigue exacerbated over this last month. She no longer drives, and finds it increasingly difficult to accompany her son as he shops for her. She is A & O x 3, but has some paranoia and delusional thoughts. She feels that the flooring in her home is specially "magnatized" so that it pulls her down and makes it easier for her to fall. She is distrustful of presented options in her health care as she believes that there is more of an interest for providers to obtain subjects for "studies" as a priority over providing what is the best care for her. She has had falls, with EMS visits to assist her back up. She feels very heavily the stigma of her HIV diagnosis. She worries that she may have inadvertently infected others in her past. For instance, her grandmother drank from a glass that the patient had drank out of. She also worries about being judged by providers, such as health aides, as regards to her care. I did provide her reassurance that short of having sexual intercourse or blood to blood contact, the risk of HIV infection to others is extremely low. Also reassured her that health care workers use universal precautions b/c it is impossible to judge who may be infected with what. We discussed Advanced Care Directives. She wishes to remain a full code, though she would not wish to be placed long term on a ventilator. I reviewed the  MOST form in detail with her and her son (via TC) in some detail, and left a copy of the form and explanatory education material for her and her son's review. Son and patient wish f/u NP visit in 2-3 weeks to complete the form, and maybe to revisit the DNR status. Patient again discussed her refusal for aggressive management of her breast cancer. I presented details of Hospice services to  her as a future option, should she wish. I also shared this in brief, with patient's son.   HOSPICE ELIGIBILITY/DIAGNOSIS: yes pending patient agreement (metastatic breast cancer, no plans for aggressive treatment).  PAST MEDICAL HISTORY:  Past Medical History:  Diagnosis Date  . Anxiety   . Avascular necrosis of bones of both hips ([place])   . Brain tumor (benign) ([place])   . Cancer of sigmoid colon [facility]) 2011   sigmoid colectomy   . Complication of anesthesia    senitive to all  . HIV (human immunodeficiency virus infection) ([place])   . Hypertension   . Invasive ductal carcinoma of right breast ([place]) 04/09/14  . 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 Status post chemotherapy 2012  . Stroke [facility])     SOCIAL HX:  Social History   Tobacco Use  . Smoking status: Never Smoker  . Smokeless tobacco: Never Used  Substance Use Topics  . Alcohol use: No    Alcohol/week: 0.0 standard drinks    ALLERGIES: No Known Allergies   PERTINENT MEDICATIONS:  Outpatient Encounter Medications as of 07/10/2018  Medication Sig  . acetaminophen (TYLENOL) 325 MG tablet Take 2 tablets (650 mg total) by mouth every 6 (six) hours as needed for mild pain (or Fever >/= 101).  Marland Kitchen atenolol (TENORMIN) 50 MG tablet TAKE 1 TABLET BY MOUTH EVERY DAY  . bictegravir-emtricitabine-tenofovir AF (BIKTARVY) 50-200-25 MG TABS tablet Take 1 tablet by mouth daily.  . Cholecalciferol (VITAMIN D PO) Take 1 tablet by mouth daily.  Marland Kitchen lidocaine-prilocaine (EMLA) cream Apply a dime size to port 1-2 hours prior to access. Cover with [employer].  . Multiple Vitamins-Minerals (AIRBORNE PO) Take 1 tablet by mouth daily.  Marland Kitchen thyroid (ARMOUR THYROID) 60 MG tablet Take 60 mg by mouth daily before breakfast.    Facility-Administered Encounter Medications as of 07/10/2018  Medication  . baclofen (LIORESAL) tablet 5 mg    PHYSICAL EXAM:   General: NAD, frail appearing, well nourished Cardiovascular: regular rate and  rhythm Pulmonary: clear ant fields Abdomen: soft, nontender, + bowel sounds GU: no suprapubic tenderness Extremities: no edema, no joint deformities Skin: no rashes Neurological: Weakness but otherwise nonfocal  Violeta Gelinas NP-C

## 2018-07-10 ENCOUNTER — Other Ambulatory Visit: Payer: Medicare Other | Admitting: Internal Medicine

## 2018-07-10 DIAGNOSIS — F22 Delusional disorders: Secondary | ICD-10-CM

## 2018-07-10 DIAGNOSIS — R531 Weakness: Secondary | ICD-10-CM

## 2018-07-10 DIAGNOSIS — B20 Human immunodeficiency virus [HIV] disease: Secondary | ICD-10-CM

## 2018-07-10 DIAGNOSIS — C50211 Malignant neoplasm of upper-inner quadrant of right female breast: Secondary | ICD-10-CM

## 2018-08-21 ENCOUNTER — Other Ambulatory Visit: Payer: Self-pay | Admitting: Internal Medicine

## 2018-08-21 DIAGNOSIS — I10 Essential (primary) hypertension: Secondary | ICD-10-CM

## 2018-08-22 ENCOUNTER — Other Ambulatory Visit: Payer: Self-pay | Admitting: Behavioral Health

## 2018-09-18 ENCOUNTER — Telehealth: Payer: Self-pay

## 2018-09-18 NOTE — Telephone Encounter (Signed)
Received VM from patient's son requesting a follow up visit to be scheduled with NP. Visit scheduled for 09/20/18

## 2018-09-20 ENCOUNTER — Encounter: Payer: Self-pay | Admitting: Nurse Practitioner

## 2018-09-20 ENCOUNTER — Other Ambulatory Visit: Payer: Medicare Other | Admitting: Nurse Practitioner

## 2018-09-20 DIAGNOSIS — Z515 Encounter for palliative care: Secondary | ICD-10-CM

## 2018-09-20 DIAGNOSIS — C50211 Malignant neoplasm of upper-inner quadrant of right female breast: Secondary | ICD-10-CM

## 2018-09-20 DIAGNOSIS — R52 Pain, unspecified: Secondary | ICD-10-CM

## 2018-09-20 DIAGNOSIS — R059 Cough, unspecified: Secondary | ICD-10-CM

## 2018-09-20 DIAGNOSIS — R630 Anorexia: Secondary | ICD-10-CM

## 2018-09-20 DIAGNOSIS — R05 Cough: Secondary | ICD-10-CM

## 2018-09-20 DIAGNOSIS — R531 Weakness: Secondary | ICD-10-CM

## 2018-09-20 DIAGNOSIS — B20 Human immunodeficiency virus [HIV] disease: Secondary | ICD-10-CM

## 2018-09-20 NOTE — Progress Notes (Signed)
[facility] Telephone: [phone] Fax: [phone]  PATIENT NAME: Kelly Chavez DOB: 1946-04-09 MRN: [id number]  PRIMARY CARE PROVIDER:   [name], MD  REFERRING PROVIDER:  [name], MD [place], [place] [address]  Dr. Zola Button (oncology Dr. [name] (ID)  RESPONSIBLE PARTY:   Tranika Scholler (son) [phone] (home/mobile)   HISTORY OF PRESENT ILLNESS:  Kelly Chavez is a 72 y.o. year old female with multiple medical problems including Stage IV breast cancer with metastasis to lung and bone, h/o colon and uterine cancer, HIV, HTN, hypothyroidism. Palliative Care was asked to help with symptom management and to address goals of care.   Collateral information: sleep is ok; she can wash up and needs some help  Getting; walking with walker; no appetite; cough with production of clear sputum; no fever, or chills, no shortness of breath at rest; does have some exercise intolerance  ASSESSMENT/PLAN/RECOMMENDATONS  Stage IV breast cancer with mets to lung and bone  -generalized Pain due to malignancy -weakness -patient has declined any further imaging or treatment for breast cancer -would recommend Oxy IR 5mg  every 6hrs for pain  -Encourage patient to f/u with Oncology and to allow imaging to assess cancer growth or further metatasis -patient uses walker and stand by assistance by son.  Decreased appetite   -decreased mobility/exercise intolerance  -cough due to lung mets -patient has not weighed herself but thinks she has lost about 20lbs -son has tried nutritional supplements -patient refuses son to help with showers; currently taking wash-ups -sputum is clear with no s/s of infection at this time -consider appetite stimulant; mirtazapine 7.5mg  at bedtime; titrate up as tolerated; may also help suspected underlying depression  -shower chair -hospital bed  HIV HTN Hypothyroidism -patient is compliant with HIV  medications -taking atenolol every other day. -patient has electronic BP cuff; advised her to take BP three times a week and record  ACP -full code -will discuss with patient at f/u visit     I spent 45 minutes providing this consultation,  from 10:00 to 10:45. More than 50% of the time in this consultation was spent coordinating communication.    CODE STATUS: Full  PPS: 40% HOSPICE ELIGIBILITY/DIAGNOSIS: TBD; will discuss case with MD given patient's refusal for treatment and increase in symptoms  PAST MEDICAL HISTORY:  Past Medical History:  Diagnosis Date  . Anxiety   . Avascular necrosis of bones of both hips ([place])   . Brain tumor (benign) ([place])   . Cancer of sigmoid colon [facility]) 2011   sigmoid colectomy   . Complication of anesthesia    senitive to all  . HIV (human immunodeficiency virus infection) ([place])   . Hypertension   . Invasive ductal carcinoma of right breast ([place]) 04/09/14  . 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 Status post chemotherapy 2012  . Stroke [facility])     SOCIAL HX:  Social History   Tobacco Use  . Smoking status: Never Smoker  . Smokeless tobacco: Never Used  Substance Use Topics  . Alcohol use: No    Alcohol/week: 0.0 standard drinks    ALLERGIES: No Known Allergies   PERTINENT MEDICATIONS:  Outpatient Encounter Medications as of 09/20/2018  Medication Sig  . acetaminophen (TYLENOL) 325 MG tablet Take 2 tablets (650 mg total) by mouth every 6 (six) hours as needed for mild pain (or Fever >/= 101).  Marland Kitchen atenolol (TENORMIN) 50 MG tablet TAKE 1 TABLET BY MOUTH EVERY DAY  .  bictegravir-emtricitabine-tenofovir AF (BIKTARVY) 50-200-25 MG TABS tablet Take 1 tablet by mouth daily.  . Cholecalciferol (VITAMIN D PO) Take 1 tablet by mouth daily.  Marland Kitchen lidocaine-prilocaine (EMLA) cream Apply a dime size to port 1-2 hours prior to access. Cover with [employer].  . Multiple Vitamins-Minerals (AIRBORNE PO) Take 1 tablet by mouth daily.  Marland Kitchen thyroid  (ARMOUR THYROID) 60 MG tablet Take 60 mg by mouth daily before breakfast.    Facility-Administered Encounter Medications as of 09/20/2018  Medication  . baclofen (LIORESAL) tablet 5 mg    PHYSICAL EXAM:   General: NAD, , thin Cardiovascular: regular rate and rhythm Pulmonary: clear ant fields Abdomen: soft, nontender, + bowel sounds GU: no suprapubic tenderness Extremities: no edema, no joint deformities Skin: no rashes Neurological: Weakness but otherwise nonfocal  Kelly Sweitzer G [place], NP

## 2018-09-23 ENCOUNTER — Encounter: Payer: Self-pay | Admitting: Nurse Practitioner

## 2018-09-23 ENCOUNTER — Other Ambulatory Visit: Payer: Self-pay

## 2018-09-23 DIAGNOSIS — C50211 Malignant neoplasm of upper-inner quadrant of right female breast: Secondary | ICD-10-CM

## 2018-09-23 DIAGNOSIS — M87051 Idiopathic aseptic necrosis of right femur: Secondary | ICD-10-CM

## 2018-09-23 DIAGNOSIS — M87052 Idiopathic aseptic necrosis of left femur: Principal | ICD-10-CM

## 2018-09-23 MED ORDER — OXYCODONE HCL 5 MG PO TABS: 5.0000 mg | ORAL_TABLET | Freq: Four times a day (QID) | ORAL | 0 refills | Status: DC | PRN

## 2018-09-24 ENCOUNTER — Other Ambulatory Visit: Payer: Self-pay | Admitting: Internal Medicine

## 2018-09-24 DIAGNOSIS — I10 Essential (primary) hypertension: Secondary | ICD-10-CM

## 2018-09-26 ENCOUNTER — Encounter: Payer: Self-pay | Admitting: Oncology

## 2018-09-26 ENCOUNTER — Telehealth: Payer: Self-pay

## 2018-09-26 NOTE — Telephone Encounter (Signed)
Received a message form the patient son requesting home health vs. Hospice in the home for assist with bathing the patient. Spoke with patient son Legrand Como and discussed home health vs hospice care regarding caregiver assist with personal care. Explained to Legrand Como that would touch base with Palliative care to clarify patient goals and needs and will request Palliative care to follow up. Legrand Como aware to call [phone] for questions or concerns. Spoke with Inez Catalina RN with [facility] and she stated that she would speak with NP and clarify patient needs and then follow up with this RN for appropriate services.

## 2018-09-26 NOTE — Telephone Encounter (Signed)
Received call from [facility] with [place]. She communicated that Stephanie Martinique NP recommended hospice services after meeting with the patient and son Legrand Como. Received verbal order from Dr. Alen Blew for hospice referral and communicated to [facility].

## 2018-09-27 ENCOUNTER — Telehealth: Payer: Self-pay

## 2018-09-27 NOTE — Telephone Encounter (Signed)
Verbal order received for hospice eval. [facility] referral center made aware.

## 2018-09-27 NOTE — Telephone Encounter (Signed)
Late entry for 09/26/18    Spoke with Seth Bake, Dr. Hazeline Junker nurse. Patient's son called cancer center to ask for patient to transition to hospice care. Reviewed NP consult notes and discussed with Colletta [place] NP. Colletta Maryland NP felt patient is appropriate for hospice services and that patient is in agreement with this. Seth Bake made aware and will speak with Dr. Alen Blew.

## 2018-09-30 ENCOUNTER — Ambulatory Visit: Payer: Medicare Other | Admitting: Internal Medicine

## 2018-10-10 ENCOUNTER — Other Ambulatory Visit: Payer: Self-pay

## 2018-10-10 ENCOUNTER — Telehealth: Payer: Self-pay

## 2018-10-10 MED ORDER — OXYCODONE HCL 5 MG PO TABS: 5.0000 mg | ORAL_TABLET | ORAL | 0 refills | Status: AC | PRN

## 2018-10-10 NOTE — Telephone Encounter (Signed)
Received a call from [place] with [place] requesting refill of the Oxycodone 5mg  with a change in administration times from Q6hrs to Q4hrs for more effective pain management. Prescription faxed to patient pharmacy and return message left for Seth Bake.

## 2018-10-21 ENCOUNTER — Other Ambulatory Visit: Payer: Self-pay | Admitting: Internal Medicine

## 2018-10-21 DIAGNOSIS — I10 Essential (primary) hypertension: Secondary | ICD-10-CM

## 2018-10-29 ENCOUNTER — Telehealth: Payer: Self-pay | Admitting: Oncology

## 2018-10-29 NOTE — Telephone Encounter (Signed)
Printed medical records and they are ready for pick-up, Release ID: [id number]

## 2018-11-08 ENCOUNTER — Encounter (HOSPITAL_COMMUNITY): Payer: Self-pay | Admitting: *Deleted

## 2018-11-08 ENCOUNTER — Emergency Department (HOSPITAL_COMMUNITY)
Admission: EM | Admit: 2018-11-08 | Discharge: 2018-11-08 | Disposition: A | Discharge status: Home / Self Care | Attending: Emergency Medicine | Admitting: Emergency Medicine

## 2018-11-08 ENCOUNTER — Other Ambulatory Visit: Payer: Self-pay

## 2018-11-08 DIAGNOSIS — I10 Essential (primary) hypertension: Secondary | ICD-10-CM | POA: Diagnosis not present

## 2018-11-08 DIAGNOSIS — Z8673 Personal history of transient ischemic attack (TIA), and cerebral infarction without residual deficits: Secondary | ICD-10-CM | POA: Diagnosis not present

## 2018-11-08 DIAGNOSIS — R0602 Shortness of breath: Secondary | ICD-10-CM | POA: Diagnosis present

## 2018-11-08 DIAGNOSIS — Z9221 Personal history of antineoplastic chemotherapy: Secondary | ICD-10-CM | POA: Insufficient documentation

## 2018-11-08 DIAGNOSIS — R0600 Dyspnea, unspecified: Secondary | ICD-10-CM | POA: Insufficient documentation

## 2018-11-08 DIAGNOSIS — Z79899 Other long term (current) drug therapy: Secondary | ICD-10-CM | POA: Diagnosis not present

## 2018-11-08 DIAGNOSIS — C799 Secondary malignant neoplasm of unspecified site: Secondary | ICD-10-CM

## 2018-11-08 DIAGNOSIS — Z85038 Personal history of other malignant neoplasm of large intestine: Secondary | ICD-10-CM | POA: Diagnosis not present

## 2018-11-08 DIAGNOSIS — B20 Human immunodeficiency virus [HIV] disease: Secondary | ICD-10-CM | POA: Insufficient documentation

## 2018-11-08 DIAGNOSIS — Z853 Personal history of malignant neoplasm of breast: Secondary | ICD-10-CM | POA: Insufficient documentation

## 2018-11-08 MED ORDER — MORPHINE SULFATE (CONCENTRATE) 10 MG /0.5 ML PO SOLN: 5.0000 mg | ORAL | 0 refills | Status: AC | PRN

## 2018-11-08 MED ORDER — CLONAZEPAM 0.5 MG PO TABS: 0.5000 mg | ORAL_TABLET | Freq: Three times a day (TID) | ORAL | 0 refills | Status: AC | PRN

## 2018-11-08 MED ORDER — OXYCODONE-ACETAMINOPHEN 5-325 MG PO TABS
1.0000 | ORAL_TABLET | Freq: Once | ORAL | Status: AC
  Administered 2018-11-08: 1 via ORAL
  Filled 2018-11-08: qty 1

## 2018-11-08 MED ORDER — MORPHINE SULFATE (CONCENTRATE) 10 MG /0.5 ML PO SOLN: 5.0000 mg | ORAL | 0 refills | Status: DC | PRN

## 2018-11-08 MED ORDER — LORAZEPAM 1 MG PO TABS
1.0000 mg | ORAL_TABLET | Freq: Once | ORAL | Status: AC
  Administered 2018-11-08: 1 mg via ORAL
  Filled 2018-11-08: qty 1

## 2018-11-08 NOTE — ED Provider Notes (Signed)
[facility] DEPT Provider Note   CSN: [id number] Arrival date & time: 11/08/18  1244     History   Chief Complaint Chief Complaint  Patient presents with  . Shortness of Breath    HPI Kelly Chavez is a 73 y.o. female.  HPI [age]-year-old female with a history of advanced metastatic breast cancer currently recently enrolled with hospice and palliative care services focusing on symptomatic treatment.  She developed shortness of breath today at home and is been requiring increasing oxygen per her son.  Hospice was contacted and that time there were thoughts that she was possibly a full code and that she was sent to the ER for further evaluation.  Patient states that she does not want CPR.  She does not want to be on a ventilator.  She feels much better at this time.  She reports generalized aching pain.  She is on oxycodone at home.  Hospice and palliative care liaison's to see in the emergency department.   Past Medical History:  Diagnosis Date  . Anxiety   . Avascular necrosis of bones of both hips ([place])   . Brain tumor (benign) ([place])   . Cancer of sigmoid colon [facility]) 2011   sigmoid colectomy   . Complication of anesthesia    senitive to all  . HIV (human immunodeficiency virus infection) ([place])   . Hypertension   . Invasive ductal carcinoma of right breast ([place]) 04/09/14  . 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 Status post chemotherapy 2012  . Stroke [facility])     Patient Active Problem List   Diagnosis Date Noted  . Dehydration 06/21/2018  . Rhabdomyolysis 06/21/2018  . Elevated troponin 06/21/2018  . Bilateral lower leg cellulitis 09/20/2017  . Weakness 09/20/2017  . Port catheter in place 06/28/2016  . 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 Acute CVA (cerebrovascular accident) ([place]) 09/14/2015  . CVA (cerebral infarction) 09/13/2015  .  Hyperlipidemia 05/31/2014  . Pedal edema 05/11/2014  . Paranoia ([place]) 05/11/2014  . Breast cancer of upper-inner quadrant of right female breast ([place]) 04/15/2014  . History of pituitary tumor 01/28/2014  . Avascular necrosis of bones of both hips ([place]) 01/28/2014  . Genital herpes 01/28/2014  . Ovarian cyst, bilateral 01/26/2014  . Essential hypertension, benign 01/26/2014  . Unspecified vitamin D deficiency 01/26/2014  . DJD (degenerative joint disease) 01/26/2014  . HIV disease ([place]) 01/07/2014    Past Surgical History:  Procedure Laterality Date  . ABDOMINAL HYSTERECTOMY    . COLON SURGERY     partial colectomy  . MASTECTOMY MODIFIED RADICAL Right 06/15/2014   Procedure: RIGHT MODIFIED RADICAL MASTECTOMY;  Surgeon: Shann Medal, MD;  Location: [facility];  Service: General;  Laterality: Right;  . PORTACATH PLACEMENT N/A 06/15/2014   Procedure: INSERTION PORT-A-CATH LEFT SUBCLAVIAN;  Surgeon: Shann Medal, MD;  Location: [facility];  Service: General;  Laterality: N/A;  . TONSILLECTOMY    . TOTAL ABDOMINAL HYSTERECTOMY W/ BILATERAL SALPINGOOPHORECTOMY  2012     OB History    Gravida  1   Para      Term      Preterm      AB      Living        SAB      TAB      Ectopic      Multiple  Live Births               Home Medications    Prior to Admission medications   Medication Sig Start Date End Date Taking? Authorizing Provider  acetaminophen (TYLENOL) 325 MG tablet Take 2 tablets (650 mg total) by mouth every 6 (six) hours as needed for mild pain (or Fever >/= 101). 09/22/17   Robbie Lis, MD  atenolol (TENORMIN) 50 MG tablet TAKE 1 TABLET BY MOUTH EVERY DAY 10/21/18   [name], MD  bictegravir-emtricitabine-tenofovir AF (BIKTARVY) 50-200-25 MG TABS tablet Take 1 tablet by mouth daily. 07/01/18   [name], MD  Cholecalciferol (VITAMIN D PO) Take 1 tablet by mouth daily.    [provider]  clonazePAM (KLONOPIN) 0.5 MG tablet Take 1  tablet (0.5 mg total) by mouth 3 (three) times daily as needed for anxiety. 11/08/18   Jola Schmidt, MD  lidocaine-prilocaine (EMLA) cream Apply a dime size to port 1-2 hours prior to access. Cover with [employer]. 05/24/18   Wyatt Portela, MD  Morphine Sulfate (MORPHINE CONCENTRATE) 10 mg / 0.5 ml concentrated solution Take 0.25 mLs (5 mg total) by mouth every 2 (two) hours as needed for severe pain. 11/08/18   Jola Schmidt, MD  Multiple Vitamins-Minerals (AIRBORNE PO) Take 1 tablet by mouth daily.    [provider]  oxyCODONE (OXY IR/ROXICODONE) 5 MG immediate release tablet Take 1 tablet (5 mg total) by mouth every 4 (four) hours as needed for severe pain. 10/10/18   Wyatt Portela, MD  thyroid (ARMOUR THYROID) 60 MG tablet Take 60 mg by mouth daily before breakfast.     [provider]    Family History Family History  Problem Relation Age of Onset  . Diabetes Father   . Hyperlipidemia Father   . Stroke Father   . Hypertension Mother   . Hyperlipidemia Mother   . Dementia Mother   . Diabetes Sister   . Diabetes Brother   . Stroke Brother     Social History Social History   Tobacco Use  . Smoking status: Never Smoker  . Smokeless tobacco: Never Used  Substance Use Topics  . Alcohol use: No    Alcohol/week: 0.0 standard drinks  . Drug use: No     Allergies   Patient has no known allergies.   Review of Systems Review of Systems  All other systems reviewed and are negative.    Physical Exam Updated Vital Signs BP (!) 135/98   Pulse (!) 111   Temp 98.2 F (36.8 C) (Oral)   Resp 16   SpO2 100%   Physical Exam Vitals signs and nursing note reviewed.  Constitutional:      General: She is not in acute distress.    Appearance: She is well-developed.  HENT:     Head: Normocephalic and atraumatic.  Neck:     Musculoskeletal: Normal range of motion.  Cardiovascular:     Rate and Rhythm: Normal rate and regular rhythm.     Heart sounds:  Normal heart sounds.  Pulmonary:     Effort: Pulmonary effort is normal.     Breath sounds: Normal breath sounds.  Abdominal:     General: There is no distension.     Palpations: Abdomen is soft.     Tenderness: There is no abdominal tenderness.  Musculoskeletal: Normal range of motion.  Skin:    General: Skin is warm and dry.  Neurological:     Mental Status:  She is alert and oriented to person, place, and time.  Psychiatric:        Judgment: Judgment normal.      ED Treatments / Results  Labs (all labs ordered are listed, but only abnormal results are displayed) Labs Reviewed - No data to display  EKG None  Radiology No results found.  Procedures Procedures (including critical care time)  Medications Ordered in ED Medications  oxyCODONE-acetaminophen (PERCOCET/ROXICET) 5-325 MG per tablet 1 tablet (1 tablet Oral Given 11/08/18 1312)     Initial Impression / Assessment and Plan / ED Course  I have reviewed the triage vital signs and the nursing notes.  Pertinent labs & imaging results that were available during my care of the patient were reviewed by me and considered in my medical decision making (see chart for details).     End-of-life care.  Seen by hospice and palliative care here in the emergency department.  Agree with discharge back to her house.  Patient feels better at this time.  Patient will need oral morphine and oral benzodiazepines at home so that her son can help manage her symptoms of shortness of breath given her progressive cancer with end-of-life nearing  Patient and family updated.  Final Clinical Impressions(s) / ED Diagnoses   Final diagnoses:  None    ED Discharge Orders         Ordered    Morphine Sulfate (MORPHINE CONCENTRATE) 10 mg / 0.5 ml concentrated solution  Every 2 hours PRN     11/08/18 1428    clonazePAM (KLONOPIN) 0.5 MG tablet  3 times daily PRN     11/08/18 1428           Jola Schmidt, MD 11/08/18 1440

## 2018-11-08 NOTE — ED Notes (Signed)
[facility] called for transport. 

## 2018-11-08 NOTE — Progress Notes (Signed)
Home Care SW and Hospice Rn Liaison made visit after learning Patient was in the Emergency Room.  Patient was guarded during visit and tearful at times when talking about her decline.  She did not talk much and was going to think about [employer] for end of life.  SW did deliver DNR during visit to Son.  SW will follow up early next week after she returns home.  Cecilio Asper, [place] [facility] [place]

## 2018-11-08 NOTE — Progress Notes (Signed)
[facility] [facility]  Pt transferred to ED after worsening SOB.  Son Legrand Como) states she had a rough night and she could not catch her breath. Pt alert but slow to respond.  She states she does not want to hurt anymore.  Residential hospice discussed, pt and son would like to try to return home.  LCSW with [facility] arrived and delivered DNR that had been discussed prior to this ED visit.  EDP Dr. Venora Maples to write for comfort prescriptions and anxiolytics.  Please use [facility] for transport home, as they contract this service for [facility].  Please call with any hospice related questions/concerns  Venia Carbon BSN, RN [facility] Liaison (direct dial listed in [place]) [phone]

## 2018-11-08 NOTE — ED Notes (Signed)
Bed: WA08 Expected date:  Expected time:  Means of arrival:  Comments: EMS-SOB 

## 2018-11-08 NOTE — ED Triage Notes (Signed)
Pt coming from home and is a palliative care pt.  Pt hx of breast CA and presents today with SOB.  Normally pt is able to take deep breaths and her SOB resolves.Over the past weak, pt has increasing SOB over the past week.  Pt has also been weak over the past week.  Pt a/o x 4.

## 2018-11-20 ENCOUNTER — Telehealth: Payer: Self-pay

## 2018-11-20 NOTE — Telephone Encounter (Signed)
Received notification from [facility] that patient died 2018/11/28.

## 2018-11-21 ENCOUNTER — Encounter: Payer: Self-pay | Admitting: Oncology

## 2018-12-15 DEATH — deceased

## 2019-05-21 IMAGING — CT NM PET TUM IMG RESTAG (PS) SKULL BASE T - THIGH
1 of 8 series · 1 of 25 positions shown · non-contrast
Comparison: CT chest 07/23/2017, PET-CT 05/27/2015

CLINICAL DATA: Subsequent treatment strategy for stage IV breast
carcinoma.

EXAM:
NUCLEAR MEDICINE PET SKULL BASE TO THIGH
TECHNIQUE: 8.8 mCi F-18 FDG was injected intravenously. Full-ring PET imaging
was performed from the skull base to thigh after the radiotracer. CT
data was obtained and used for attenuation correction and anatomic
localization.
FASTING BLOOD GLUCOSE:  Value: 68 mg/dl

[Series 3: pet sk_thigh ac · axial · 5.0mm · 4.07mm/px · 1 of 191 slices shown]
[im 127/191]
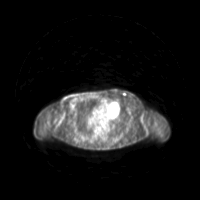

[1 of 25 positions shown; findings below may reference images not displayed]

FINDINGS: NECK

No hypermetabolic lymph nodes in the neck.

CHEST

Within the anterior RIGHT chest wall, nodule lesion positioned
between the muscle layers of the pectoralis musculature measures 15
mm (image 55, series 4) has intense metabolic activity for size with
SUV max equal 11.6. There is less defined soft tissue more
superiorly adjacent to this nodule (image 49, series 4) also with
hypermetabolic activity (SUV max equal 4.0.

Post surgical change in the RIGHT breast and axillary
lymphadenectomy.

Within LEFT upper lobe 6 mm nodule (image 63, series 4) has faint
radiotracer activity.

ABDOMEN/PELVIS

No abnormal hypermetabolic activity within the liver, pancreas,
adrenal glands, or spleen. No hypermetabolic lymph nodes in the
abdomen or pelvis.

SKELETON

There is intense uptake associated with the RIGHT humeral head.
There is serpiginous lesion with sub chondral collapse (favored
degenerative change).

No additional evidence skeletal metastasis per
IMPRESSION: 1. Local breast cancer recurrence with hypermetabolic nodularity
between the muscle layers of the RIGHT pectoralis muscle.
2. LEFT upper lung lobe pulmonary nodule with mild metabolic
activity is concerning for pulmonary metastasis.
3. Subchondral collapse of the RIGHT femoral head with intense
metabolic activity. Favor degenerative change and less likely
pathologic fracture from tumor metastasis.
